# Patient Record
Sex: Female | Born: 1990 | Race: White | Hispanic: No | Marital: Married | Smoking: Never smoker
Health system: Southern US, Community
[De-identification: ages and names within clinical notes are randomized; demographics above are authoritative.]

## PROBLEM LIST (undated history)

## (undated) ENCOUNTER — Inpatient Hospital Stay (HOSPITAL_COMMUNITY): Payer: Self-pay

## (undated) DIAGNOSIS — I10 Essential (primary) hypertension: Secondary | ICD-10-CM

## (undated) DIAGNOSIS — E039 Hypothyroidism, unspecified: Secondary | ICD-10-CM

## (undated) DIAGNOSIS — F32A Depression, unspecified: Secondary | ICD-10-CM

## (undated) DIAGNOSIS — Z6841 Body Mass Index (BMI) 40.0 and over, adult: Secondary | ICD-10-CM

## (undated) DIAGNOSIS — E0590 Thyrotoxicosis, unspecified without thyrotoxic crisis or storm: Secondary | ICD-10-CM

## (undated) HISTORY — DX: Hypothyroidism, unspecified: E03.9

## (undated) HISTORY — DX: Depression, unspecified: F32.A

## (undated) HISTORY — DX: Morbid (severe) obesity due to excess calories: E66.01

## (undated) HISTORY — DX: Essential (primary) hypertension: I10

## (undated) HISTORY — PX: NO PAST SURGERIES: SHX2092

## (undated) HISTORY — DX: Thyrotoxicosis, unspecified without thyrotoxic crisis or storm: E05.90

## (undated) HISTORY — DX: Body Mass Index (BMI) 40.0 and over, adult: Z684

---
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
## 2008-01-01 ENCOUNTER — Ambulatory Visit: Payer: Self-pay | Admitting: Obstetrics and Gynecology

## 2008-01-10 ENCOUNTER — Ambulatory Visit (HOSPITAL_COMMUNITY): Admission: RE | Admit: 2008-01-10 | Discharge: 2008-01-10 | Payer: Self-pay | Admitting: Obstetrics & Gynecology

## 2008-01-16 ENCOUNTER — Ambulatory Visit: Payer: Self-pay | Admitting: Obstetrics and Gynecology

## 2008-01-20 ENCOUNTER — Ambulatory Visit (HOSPITAL_COMMUNITY): Admission: RE | Admit: 2008-01-20 | Discharge: 2008-01-20 | Payer: Self-pay | Admitting: Family Medicine

## 2008-01-20 ENCOUNTER — Ambulatory Visit: Payer: Self-pay | Admitting: Obstetrics and Gynecology

## 2008-01-23 ENCOUNTER — Ambulatory Visit: Payer: Self-pay | Admitting: Obstetrics & Gynecology

## 2008-01-23 ENCOUNTER — Ambulatory Visit (HOSPITAL_COMMUNITY): Admission: RE | Admit: 2008-01-23 | Discharge: 2008-01-23 | Payer: Self-pay | Admitting: Obstetrics & Gynecology

## 2008-01-23 ENCOUNTER — Encounter: Payer: Self-pay | Admitting: Obstetrics & Gynecology

## 2008-01-27 ENCOUNTER — Ambulatory Visit (HOSPITAL_COMMUNITY): Admission: RE | Admit: 2008-01-27 | Discharge: 2008-01-27 | Payer: Self-pay | Admitting: Obstetrics & Gynecology

## 2008-01-29 ENCOUNTER — Ambulatory Visit: Payer: Self-pay | Admitting: Family Medicine

## 2008-01-31 ENCOUNTER — Inpatient Hospital Stay (HOSPITAL_COMMUNITY): Admission: AD | Admit: 2008-01-31 | Discharge: 2008-01-31 | Payer: Self-pay | Admitting: Obstetrics and Gynecology

## 2008-01-31 ENCOUNTER — Ambulatory Visit: Payer: Self-pay | Admitting: Family

## 2008-02-03 ENCOUNTER — Ambulatory Visit (HOSPITAL_COMMUNITY): Admission: RE | Admit: 2008-02-03 | Discharge: 2008-02-03 | Payer: Self-pay | Admitting: Obstetrics & Gynecology

## 2008-02-05 ENCOUNTER — Ambulatory Visit: Payer: Self-pay | Admitting: Obstetrics and Gynecology

## 2008-02-06 ENCOUNTER — Ambulatory Visit (HOSPITAL_COMMUNITY): Admission: RE | Admit: 2008-02-06 | Discharge: 2008-02-06 | Payer: Self-pay | Admitting: Obstetrics & Gynecology

## 2008-02-10 ENCOUNTER — Ambulatory Visit (HOSPITAL_COMMUNITY): Admission: RE | Admit: 2008-02-10 | Discharge: 2008-02-10 | Payer: Self-pay | Admitting: Obstetrics & Gynecology

## 2008-02-12 ENCOUNTER — Ambulatory Visit: Payer: Self-pay | Admitting: Obstetrics and Gynecology

## 2008-02-12 ENCOUNTER — Encounter: Payer: Self-pay | Admitting: Obstetrics & Gynecology

## 2008-02-12 LAB — CONVERTED CEMR LAB
ALT: 8 units/L (ref 0–35)
AST: 13 units/L (ref 0–37)
Albumin: 3.1 g/dL — ABNORMAL LOW (ref 3.5–5.2)
Alkaline Phosphatase: 199 units/L — ABNORMAL HIGH (ref 39–117)
Glucose, Bld: 75 mg/dL (ref 70–99)
MCHC: 32.5 g/dL (ref 30.0–36.0)
Potassium: 4.3 meq/L (ref 3.5–5.3)
RBC: 4.33 M/uL (ref 3.87–5.11)
Sodium: 136 meq/L (ref 135–145)
Total Protein: 6.5 g/dL (ref 6.0–8.3)

## 2008-02-13 ENCOUNTER — Inpatient Hospital Stay (HOSPITAL_COMMUNITY): Admission: AD | Admit: 2008-02-13 | Discharge: 2008-02-14 | Payer: Self-pay | Admitting: Family Medicine

## 2008-02-13 ENCOUNTER — Encounter: Payer: Self-pay | Admitting: Obstetrics & Gynecology

## 2008-02-13 ENCOUNTER — Ambulatory Visit: Payer: Self-pay | Admitting: Physician Assistant

## 2008-02-17 ENCOUNTER — Ambulatory Visit (HOSPITAL_COMMUNITY): Admission: RE | Admit: 2008-02-17 | Discharge: 2008-02-17 | Payer: Self-pay | Admitting: Obstetrics & Gynecology

## 2008-02-19 ENCOUNTER — Encounter: Payer: Self-pay | Admitting: Obstetrics & Gynecology

## 2008-02-19 ENCOUNTER — Ambulatory Visit: Payer: Self-pay | Admitting: Obstetrics and Gynecology

## 2008-02-19 LAB — CONVERTED CEMR LAB
AST: 13 units/L (ref 0–37)
Alkaline Phosphatase: 225 units/L — ABNORMAL HIGH (ref 39–117)
BUN: 5 mg/dL — ABNORMAL LOW (ref 6–23)
Collection Interval-CRCL: 24 hr
Creatinine 24 HR UR: 1390 mg/24hr (ref 700–1800)
Creatinine, Ser: 0.49 mg/dL (ref 0.40–1.20)
Creatinine, Urine: 57.3 mg/dL
HCT: 38.9 % (ref 36.0–46.0)
Hemoglobin: 12.7 g/dL (ref 12.0–15.0)
MCHC: 32.6 g/dL (ref 30.0–36.0)
MCV: 83.1 fL (ref 78.0–100.0)
Protein, Ur: 194 mg/24hr — ABNORMAL HIGH (ref 50–100)
RDW: 16.6 % — ABNORMAL HIGH (ref 11.5–15.5)

## 2008-02-20 ENCOUNTER — Ambulatory Visit: Payer: Self-pay | Admitting: Family Medicine

## 2008-02-20 ENCOUNTER — Ambulatory Visit (HOSPITAL_COMMUNITY): Admission: RE | Admit: 2008-02-20 | Discharge: 2008-02-20 | Payer: Self-pay | Admitting: Obstetrics & Gynecology

## 2008-02-22 ENCOUNTER — Encounter: Payer: Self-pay | Admitting: Obstetrics & Gynecology

## 2008-02-22 ENCOUNTER — Inpatient Hospital Stay (HOSPITAL_COMMUNITY): Admission: AD | Admit: 2008-02-22 | Discharge: 2008-02-24 | Payer: Self-pay | Admitting: Obstetrics & Gynecology

## 2008-02-22 ENCOUNTER — Ambulatory Visit: Payer: Self-pay | Admitting: Advanced Practice Midwife

## 2008-04-08 ENCOUNTER — Encounter: Payer: Self-pay | Admitting: Obstetrics & Gynecology

## 2008-04-08 ENCOUNTER — Ambulatory Visit: Payer: Self-pay | Admitting: Family Medicine

## 2008-04-08 LAB — CONVERTED CEMR LAB: TSH: 0.043 microintl units/mL — ABNORMAL LOW (ref 0.350–4.50)

## 2008-04-09 ENCOUNTER — Encounter: Payer: Self-pay | Admitting: Obstetrics & Gynecology

## 2008-06-30 ENCOUNTER — Ambulatory Visit: Payer: Self-pay | Admitting: Nurse Practitioner

## 2008-08-24 ENCOUNTER — Ambulatory Visit: Payer: Self-pay | Admitting: Internal Medicine

## 2008-09-29 ENCOUNTER — Ambulatory Visit: Payer: Self-pay | Admitting: Obstetrics & Gynecology

## 2008-12-21 ENCOUNTER — Ambulatory Visit: Payer: Self-pay | Admitting: Family Medicine

## 2009-03-22 ENCOUNTER — Ambulatory Visit: Payer: Self-pay | Admitting: Obstetrics and Gynecology

## 2009-06-17 ENCOUNTER — Ambulatory Visit: Payer: Self-pay | Admitting: Obstetrics & Gynecology

## 2009-06-22 IMAGING — US US FETAL BPP W/O NONSTRESS
1 series · 9 of 9 positions shown · non-contrast
Comparison: none

OBSTETRICAL ULTRASOUND:
 This ultrasound was performed in The [HOSPITAL], and the [facility] report will be stored to [REDACTED] PACS.

[Series 1: us fetal bpp w/o nonstress · 9 acquisitions, 9 frames shown]
[im 1/9]
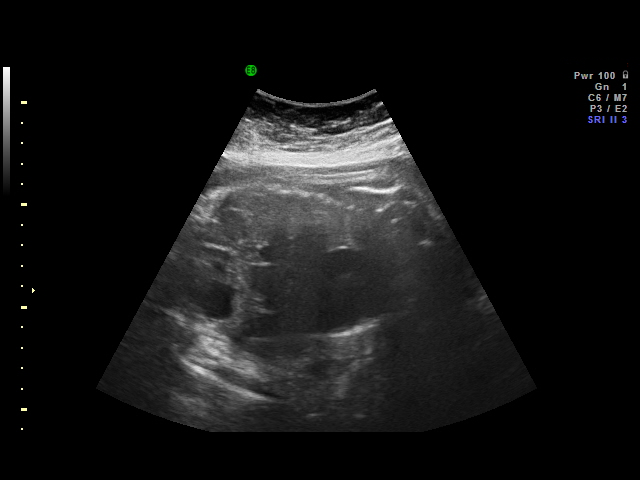
[im 2/9]
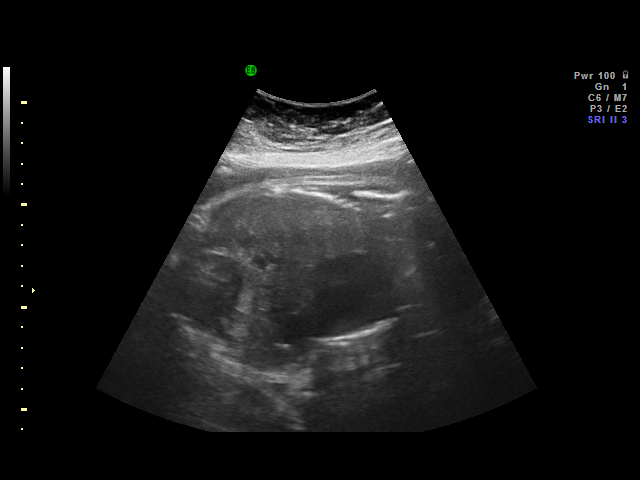
[im 3/9]
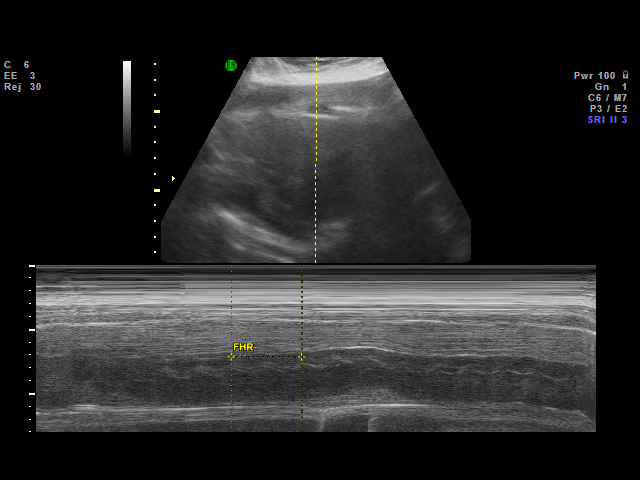
[im 4/9]
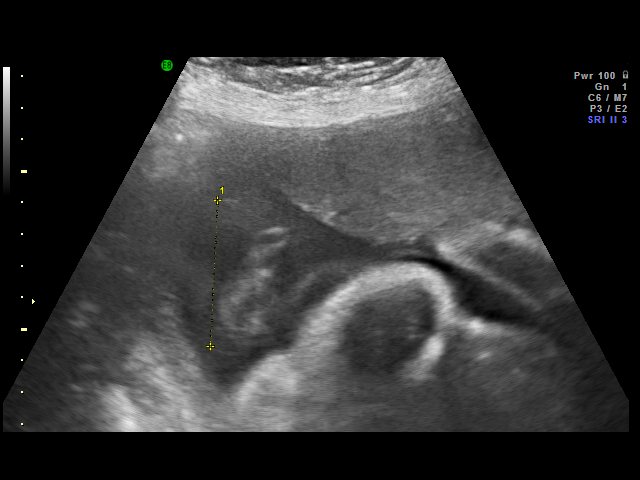
[im 5/9]
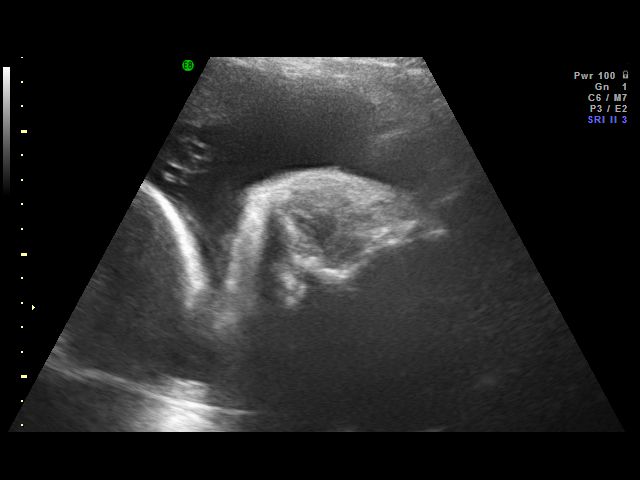
[im 6/9]
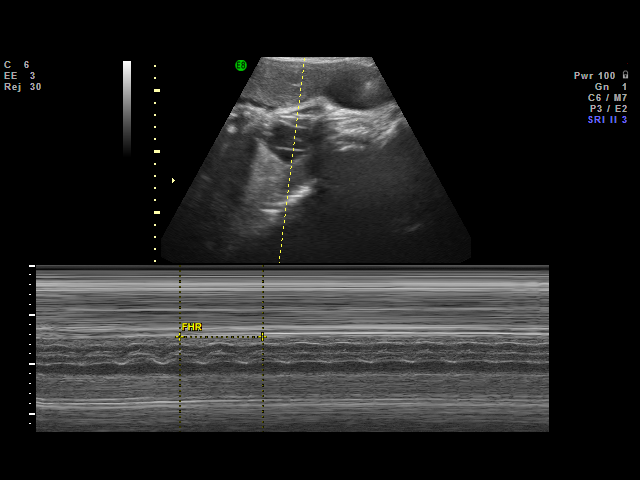
[im 7/9]
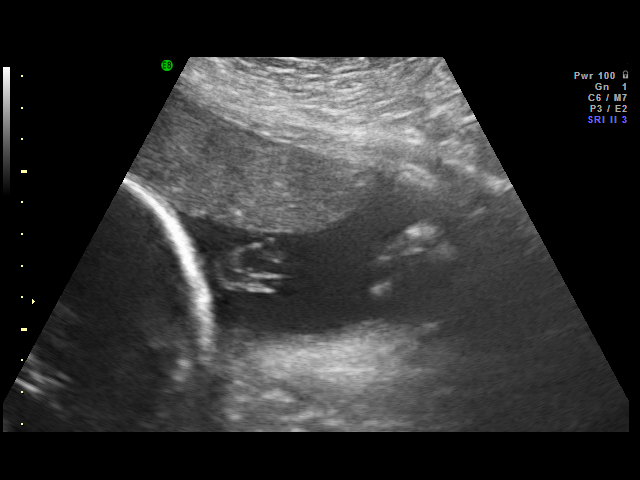
[im 8/9]
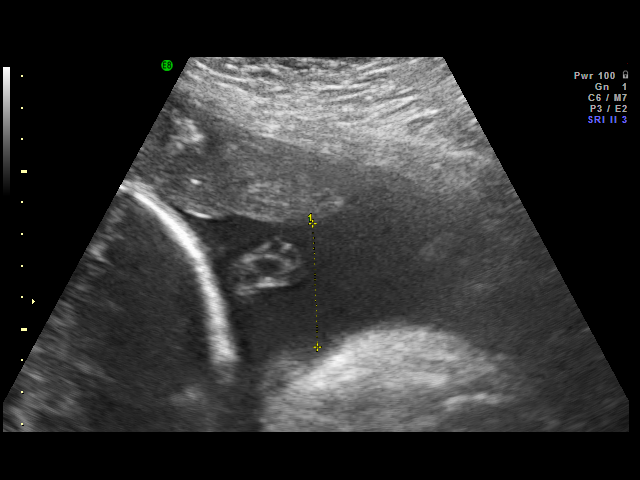
[im 9/9]
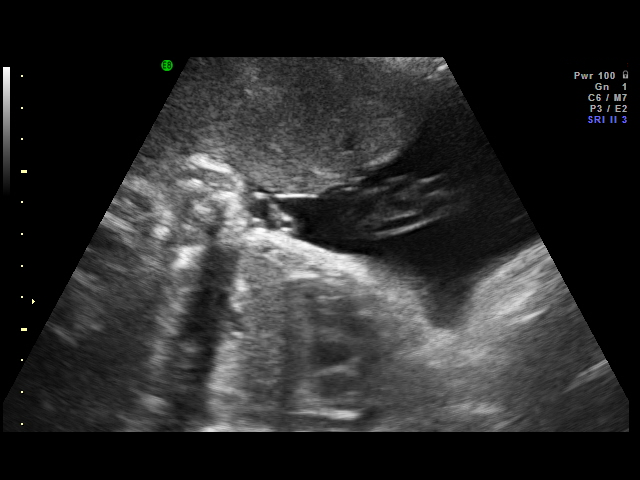

[9 of 9 positions shown; findings below may reference images not displayed]

IMPRESSION: [facility] has also been faxed to the ordering physician.

## 2009-06-25 IMAGING — US US MFM FETAL BPP W/ NONSTRESS ADDL GEST
1 series · 18 of 18 positions shown · non-contrast
Comparison: none

OBSTETRICAL ULTRASOUND:
 This ultrasound was performed in The [HOSPITAL], and the [facility] report will be stored to [REDACTED] PACS.

[Series 1: us mfm fetal bpp w/ nonstress addl gest · 18 of 18 slices shown]
[im 1/18]
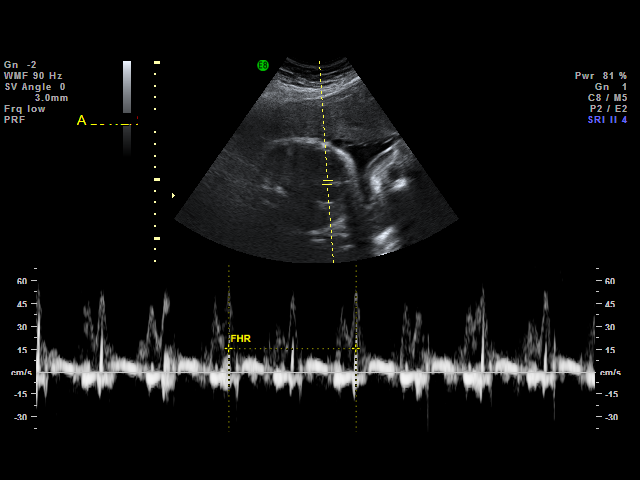
[im 2/18]
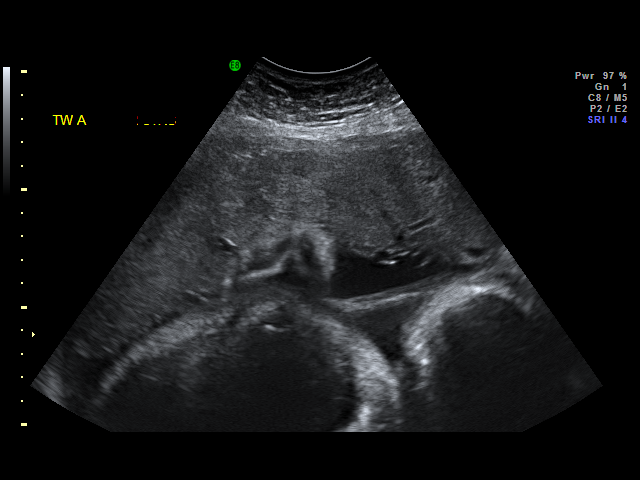
[im 3/18]
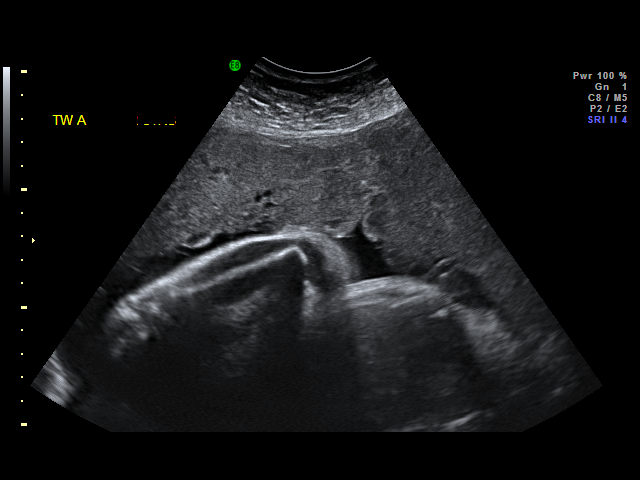
[im 4/18]
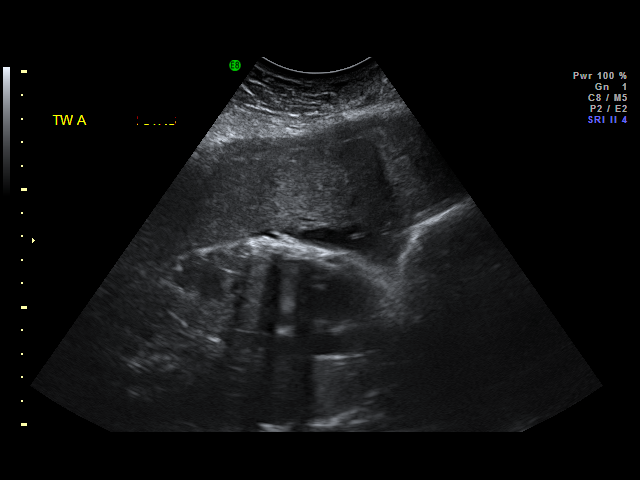
[im 5/18]
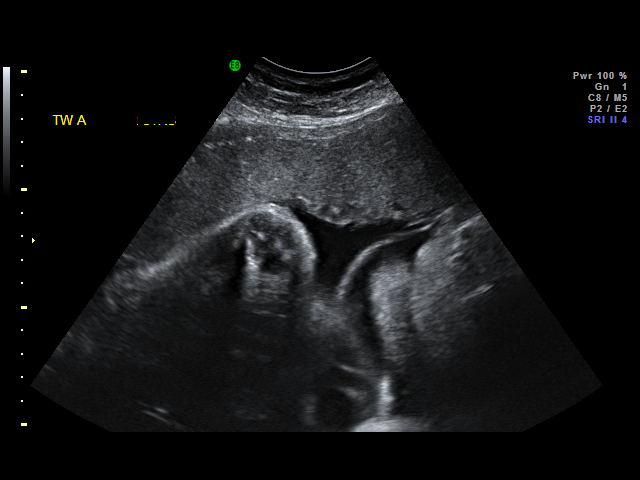
[im 6/18]
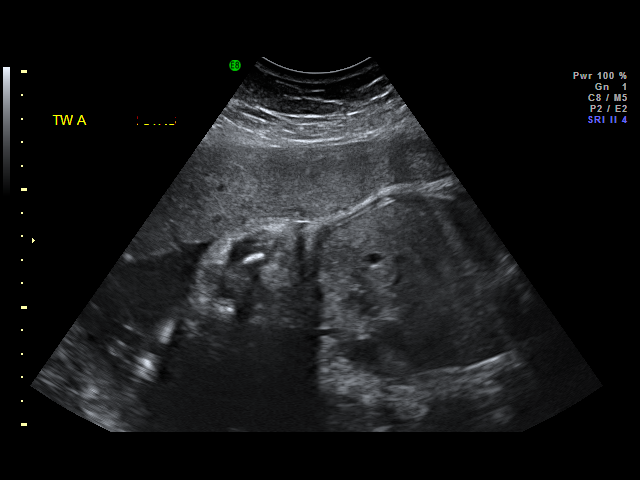
[im 7/18]
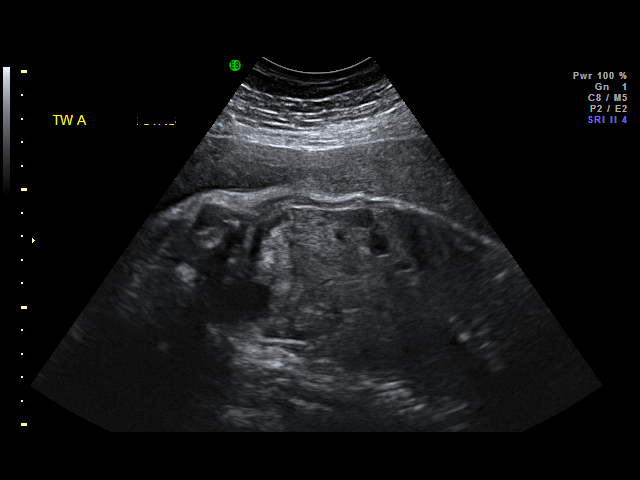
[im 8/18]
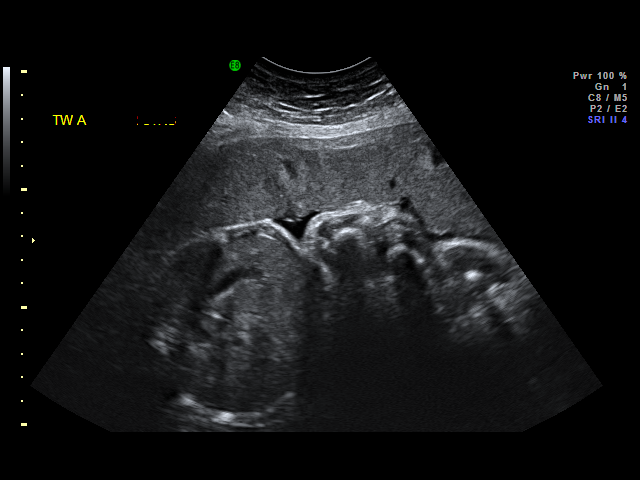
[im 9/18]
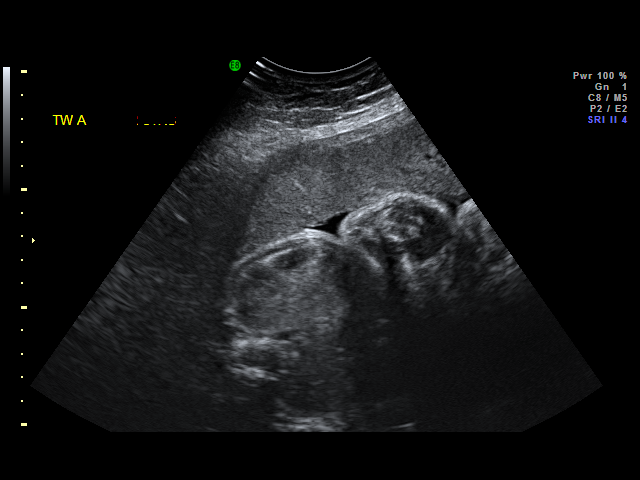
[im 10/18]
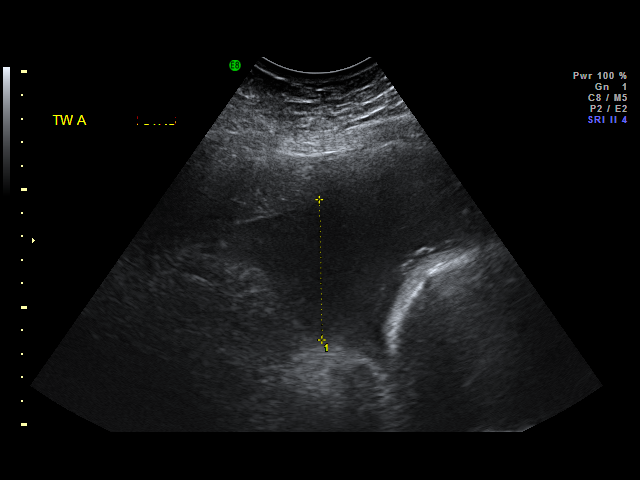
[im 11/18]
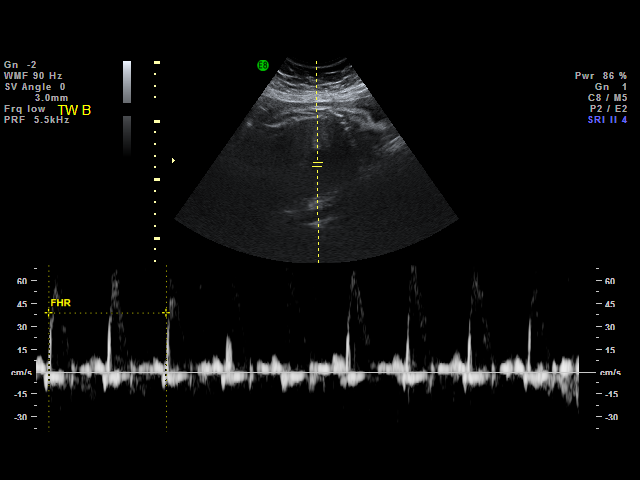
[im 12/18]
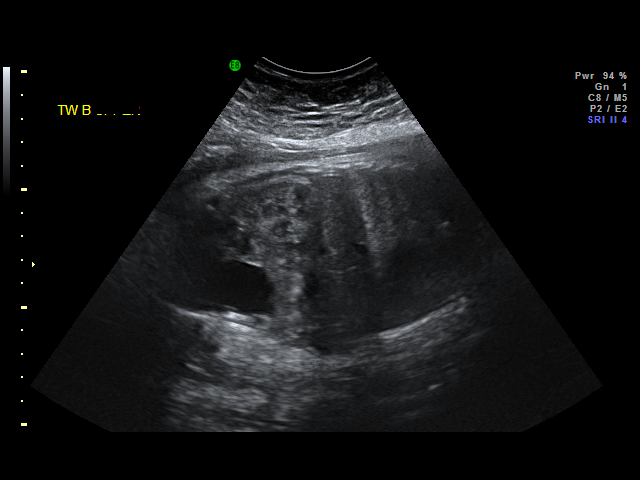
[im 13/18]
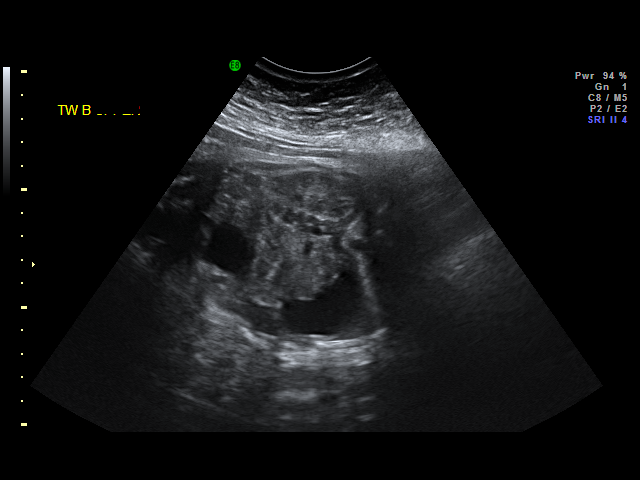
[im 14/18]
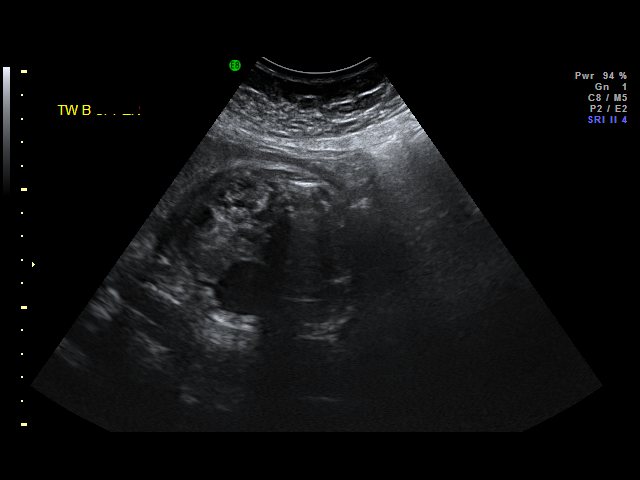
[im 15/18]
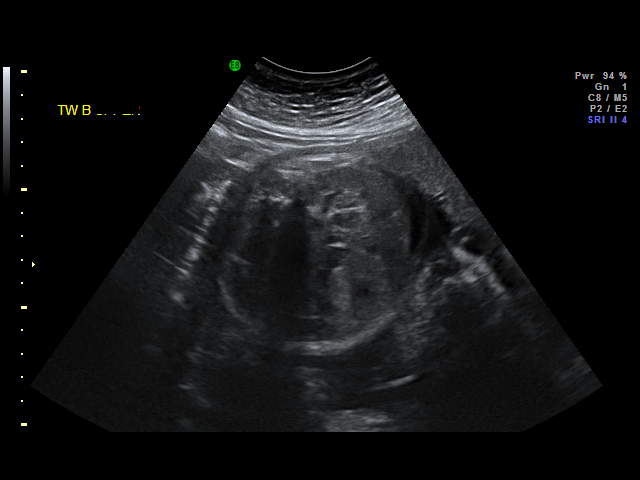
[im 16/18]
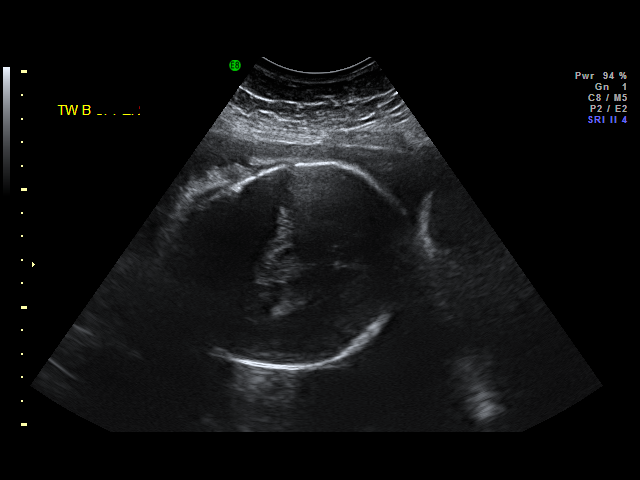
[im 17/18]
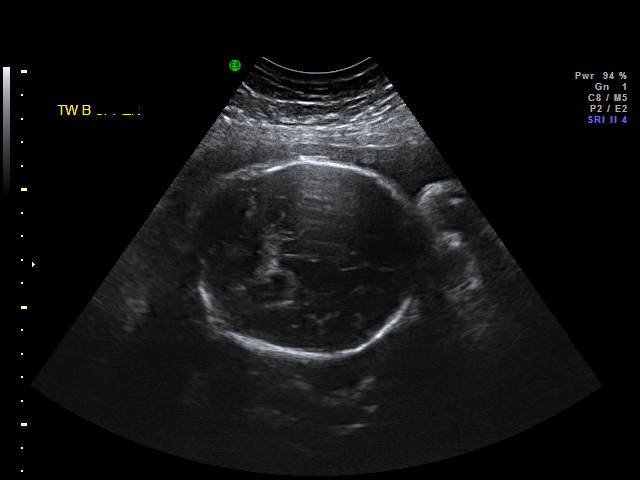
[im 18/18]
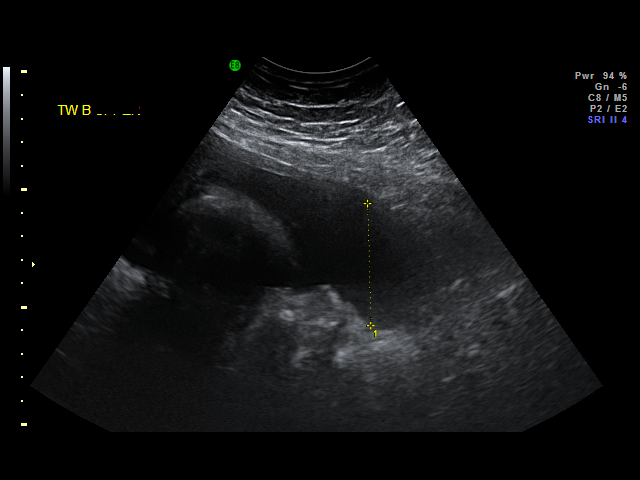

[18 of 18 positions shown; findings below may reference images not displayed]

IMPRESSION: [facility] has also been faxed to the ordering physician.

## 2009-07-06 IMAGING — US US FETAL BPP W/O NONSTRESS
1 series · 9 of 9 positions shown · non-contrast
Comparison: none

OBSTETRICAL ULTRASOUND:
 This ultrasound was performed in The [HOSPITAL], and the [facility] report will be stored to [REDACTED] PACS.

[Series 1: us fetal bpp w/o nonstress · 9 acquisitions, 9 frames shown]
[im 1/9]
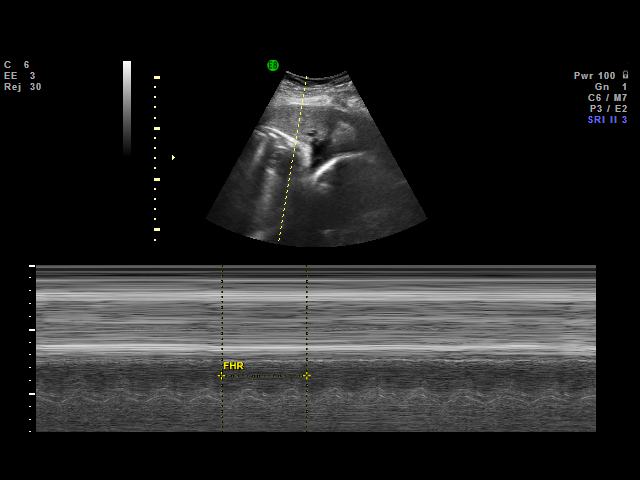
[im 2/9]
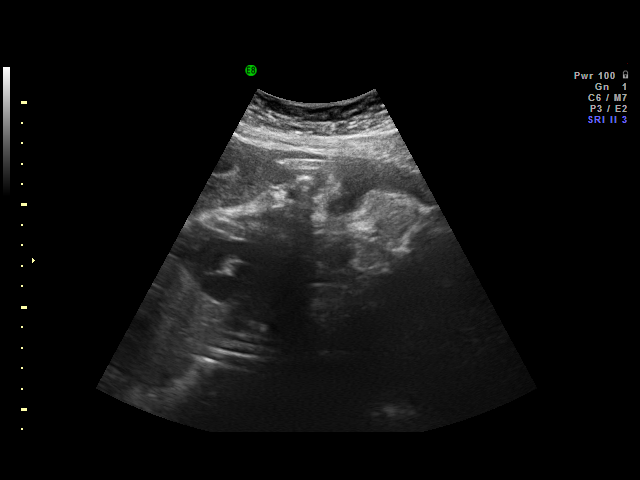
[im 3/9]
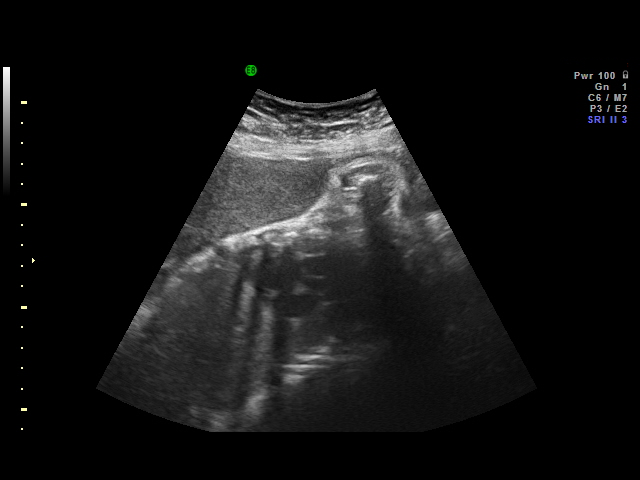
[im 4/9]
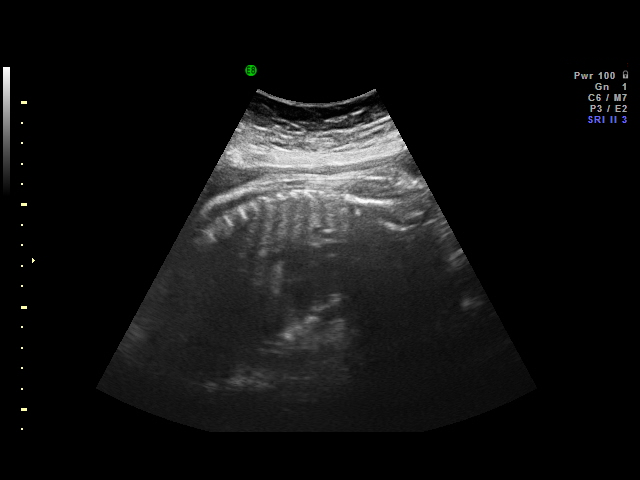
[im 5/9]
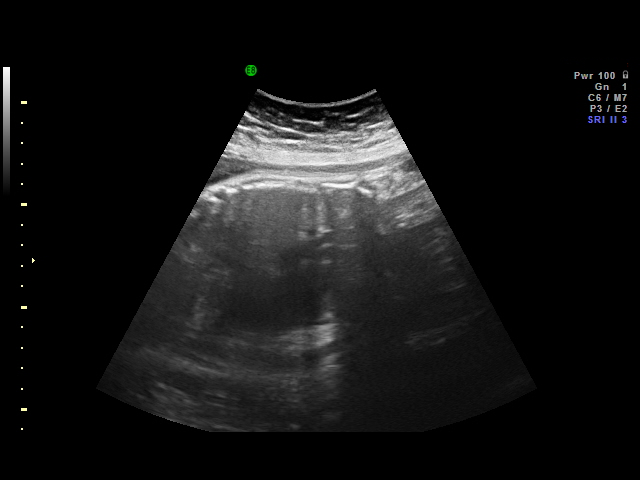
[im 6/9]
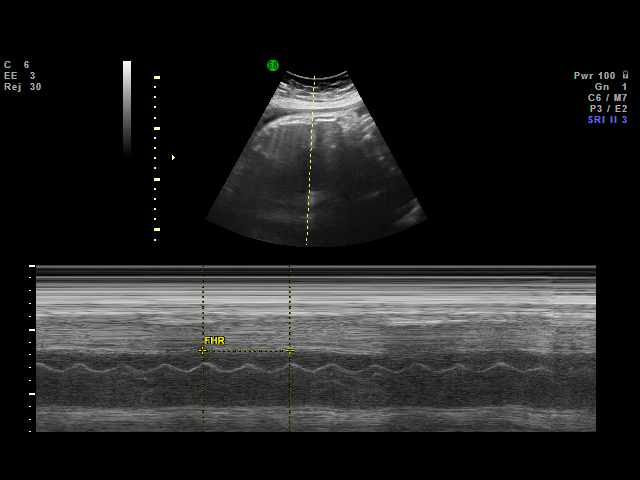
[im 7/9]
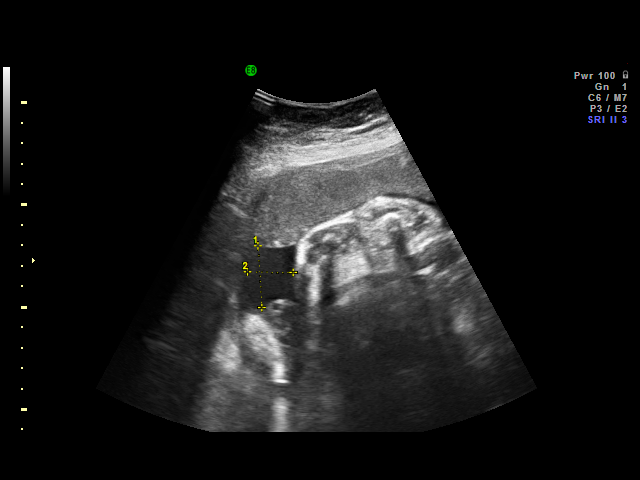
[im 8/9]
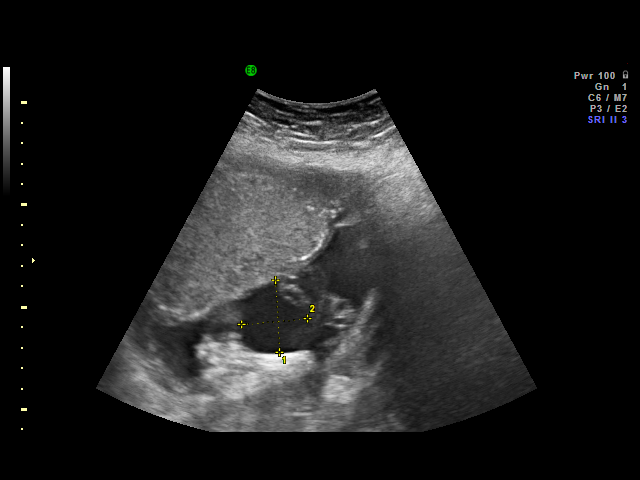
[im 9/9]
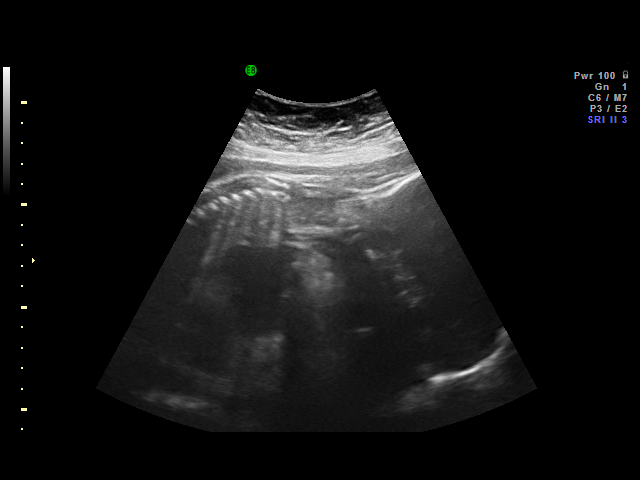

[9 of 9 positions shown; findings below may reference images not displayed]

IMPRESSION: [facility] has also been faxed to the ordering physician.

## 2009-09-09 ENCOUNTER — Ambulatory Visit: Payer: Self-pay | Admitting: Family Medicine

## 2009-12-07 ENCOUNTER — Other Ambulatory Visit: Admission: RE | Admit: 2009-12-07 | Discharge: 2009-12-07 | Payer: Self-pay | Admitting: Family Medicine

## 2009-12-07 ENCOUNTER — Ambulatory Visit: Payer: Self-pay | Admitting: Family Medicine

## 2009-12-15 ENCOUNTER — Ambulatory Visit: Payer: Self-pay | Admitting: Family Medicine

## 2009-12-15 LAB — CONVERTED CEMR LAB: Prolactin: 20 ng/mL

## 2009-12-16 ENCOUNTER — Encounter: Payer: Self-pay | Admitting: Family Medicine

## 2009-12-16 LAB — CONVERTED CEMR LAB: T3, Free: 1.2 pg/mL — ABNORMAL LOW (ref 2.3–4.2)

## 2010-04-14 ENCOUNTER — Ambulatory Visit (INDEPENDENT_AMBULATORY_CARE_PROVIDER_SITE_OTHER): Payer: Self-pay | Admitting: Obstetrics & Gynecology

## 2010-04-14 ENCOUNTER — Ambulatory Visit: Payer: Self-pay

## 2010-04-14 DIAGNOSIS — Z30431 Encounter for routine checking of intrauterine contraceptive device: Secondary | ICD-10-CM

## 2010-05-27 NOTE — Assessment & Plan Note (Signed)
NAMEMarland Kitchen  DENEISHA, DADE NO.:  [id number]  MEDICAL RECORD NO.:  [id number]           PATIENT TYPE:  LOCATION:  [facility]           FACILITY:  PHYSICIAN:  Catalina Antigua, MD     DATE OF BIRTH:  [birth year]  DATE OF SERVICE:                                 CLINIC NOTE  HISTORY OF PRESENT ILLNESS:  This is a 20 year old G1, P1-0-0-2 who presents today for IUD check an complains of dysuria.  The patient had the IUD inserted on December 16, 2009, and has been content with her form of birth control.  The patient states that over the past week or so, she has been experiencing burning upon urination and frequent urination and was concerned that it may be related to the IUD.  The patient is otherwise doing well.  Denies any abnormal bleeding or discharge.  On physical exam, her blood pressure is 143/102, weight of 284 pounds, height of 5 feet 6 inches.  Her abdomen is soft, nontender, and nondistended.  Some mild suprapubic tenderness was elicited.  On pelvic exam, she had normal vaginal mucosa and cervix.  IUD strings were visualized.  No abnormal bleeding or discharge.  ASSESSMENT/PLAN:  She is a 20 year old G1, P1 0-0-2 who is here for IUD check and evaluation of dysuria.  The patient did not take her blood pressure medication today and was advised to take her blood pressure medication as directed and to follow up with her primary care physician. The patient was also informed that she had a urinary tract infection as the UA demonstrated positive nitrites and leukocytes.  Prescription for Keflex was provided.  The patient is otherwise scheduled to return in October for annual exam.          ______________________________ Catalina Antigua, MD    PC/MEDQ  D:  04/14/2010  T:  04/15/2010  Job:  [id number]

## 2010-07-19 NOTE — Assessment & Plan Note (Signed)
NAME:  Tara Woodward, MAGO NO.:  [id number]   MEDICAL RECORD NO.:  [id number]          PATIENT TYPE:  POB   LOCATION:  [facility] at [facility]         FACILITY:  [facility]   PHYSICIAN:  Tinnie Gens, MD        DATE OF BIRTH:  1990-05-24   DATE OF SERVICE:  12/07/2009                                  CLINIC NOTE   CHIEF COMPLAINT:  Physical exam.   HISTORY OF PRESENT ILLNESS:  The patient is a 20 year old gravida 1,  para 1-0-0-2 who had a twin vaginal delivery approximately 2 years ago.  Since that time she has not been back here as she has been getting Depo-  Provera.  She does have cycles on her Depo, last was September 2010.  Her weight is 281 pounds, up approximately 30 pounds since beginning  pregnancy 2 years ago.  She also has a history of hypertension and  hypothyroidism; however, she has not been able to afford labetalol for  blood pressure medicine.  She is taking Armour Thyroid at times,  although she cannot necessarily afford that.  She states that she last  had her thyroid checked approximately 3 months ago.   PAST MEDICAL HISTORY:  1. Hypertension.  2. Hypothyroidism.  3. Morbid obesity.   PAST SURGICAL HISTORY:  Negative.   MEDICATIONS:  She is on Armour Thyroid 60 mg p.o. daily if she can  afford to take it.  She is supposed to be on labetalol 300 mg p.o.  b.i.d. which she cannot afford presently.   ALLERGIES:  None known.   FAMILY HISTORY:  Heart disease, hypertension, COPD.  Hypertension,  coronary artery disease, cerebrovascular accident, diabetes and lung  cancer in a family member who is a smoker.   SOCIAL HISTORY:  The patient lives with her husband who works for [employer]  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   REVIEW OF SYSTEMS:  A 14-point review of system reviewed.  She reports  her mood as fairly stable.  She has sad times but not terrible.  She  denies fevers, chills, nausea, vomiting, diarrhea, constipation,  shortness of breath, chest pain, swelling of feet or ankles, breast  masses, vaginal discharge.   PHYSICAL EXAMINATION:  VITAL SIGNS:  As noted in the chart.  Blood  pressure is 144/114, recheck 146/117.  GENERAL:  She is an obese female in no acute distress.  HEENT:  Normocephalic, atraumatic.  Sclerae anicteric.  NECK:  Supple.  Normal thyroid.  LUNGS:  Clear bilaterally.  CV:  Regular rate and rhythm.  No rubs, gallops or murmurs.  ABDOMEN:  Soft and obese.  No masses.  EXTREMITIES:  No cyanosis, clubbing or edema.  2+ distal pulses.  BREASTS:  Symmetric with everted nipples.  No masses.  No  supraclavicular or axillary adenopathy.  GU:  Normal external female genitalia.  BUS is normal.  Vagina is pink  and rugated.  Cervix is anterior.  I suspect that her uterus is  retroverted, although getting an excellent feel is difficult given body  habitus.  Adnexal exam is similarly limited.   IMPRESSION:  1. Morbid obesity.  2.  Hypertension, poorly controlled.  3. Hypothyroidism, unclear control.  4. Undesired fertility.  Discussed with the patient at length need for      blood pressure control.  5. Pap smear today.  6. Begin HCTZ, will get a 10-dollar list for 74-month supply.  Would      recommend rechecking her thyroid function in approximately 3 months      to see how this does.  I have also recommended an IUD.  The patient      is given information on this.  She will call back to schedule.  She      got Depo-Provera today so she may schedule any time over the next 3      months.           ______________________________  Tinnie Gens, MD     TP/MEDQ  D:  12/07/2009  T:  12/08/2009  Job:  [id number]

## 2010-07-19 NOTE — Assessment & Plan Note (Signed)
NAMEJORDAIN, RADIN              ACCOUNT NO.:  [id number]   MEDICAL RECORD NO.:  [id number]          PATIENT TYPE:  POB   LOCATION:  [facility]         FACILITY:  [facility]   PHYSICIAN:  Tinnie Gens, MD        DATE OF BIRTH:  01/02/1991   DATE OF SERVICE:                                  CLINIC NOTE   CHIEF COMPLAINT:  Postpartum check.   HISTORY OF PRESENT ILLNESS:  The patient is a 20 year old gravida 1,  para 1-0-0-2, who had vaginal delivery of twins on February 22, 2008.  Since that time, she had done well.  The babies are not really sleeping  and they are being bottle fed.  She is currently on her cycle which  seems heavy for her.  This is first period back after pregnancy.  She  has not yet had sex.  She reports that her mood is okay.  She was not  having significant postpartum depression issues.  She is continuing on  her labetalol 300 twice a day.  She is on medication prior to achieving  pregnancy.  Her blood pressure control was okay with continuing this.  She is also on Armour Thyroid and has not had a level checked since  November 2009.  She likes the Depo-Provera for contraception.  The  patient is less than 21 and has not needed Pap smears as of yet.   PHYSICAL EXAMINATION:  VITAL SIGNS:  Today, her vitals are as in the  chart.  GENERAL:  She is a well-developed and well-nourished female.  She is a  morbidly obese female in no acute distress.  ABDOMEN:  Soft, nontender, and nondistended.  GU:  Heavy vaginal bleeding noted.  Normal external female genitalia.  Cervix is approximately finger tip dilated, but is small and anteverted  exam is somewhat limited by body habitus.   IMPRESSION:  Postpartum check.  No evidence of postpartum depression.   PLAN:  1. Depo-Provera.  2. Check TSH today.  3. Continue labetalol.  4. Return with difficulties in mood.  5. First Pap smear at age 42.           ______________________________  Tinnie Gens, MD     TP/MEDQ   D:  04/08/2008  T:  04/08/2008  Job:  [id number]

## 2010-07-19 NOTE — Assessment & Plan Note (Signed)
NAMEMarland Kitchen  Tara, Woodward NO.:  [id number]   MEDICAL RECORD NO.:  [id number]          PATIENT TYPE:  POB   LOCATION:  [facility] at [facility]         FACILITY:  [facility]   PHYSICIAN:  Tinnie Gens, MD        DATE OF BIRTH:  Jul 25, 1990   DATE OF SERVICE:  12/15/2009                                  CLINIC NOTE   CHIEF COMPLAINT:  IUD insertion.   HISTORY OF PRESENT ILLNESS:  The patient is a 20 year old gravida 1,  para 1-0-0-2 who came in last week for physical exam.  She had been on  Depo-Provera and desires some different for birth control.  After  consideration of options, she opted for Mirena IUD insertion.   The patient also today is complaining of producing breast milk in both  breasts.  She did not breast feed her babies and they are also 54-month-  old and so she is unclear why she is suddenly having this.  She has been  having fairly regular spotting, but she has been on Depo-Provera since  long and whether she has been having normal cycles.  She does have  abnormal thyroid; however, she has not been completely taking it.   PHYSICAL EXAMINATION:  VITAL SIGNS:  As in the chart.  Her blood  pressure is 136/110.  GENERAL:  She is an obese female in no acute distress.  GU:  Normal external female genitalia.  BUS is normal.  Vagina is pink  and rugated.  Cervix is parous and without lesion.   PROCEDURE:  Cervix is grasped at the anterior lip with single tooth  tenaculum, cleaned with Betadine x2, sounded to 7 cm.  A Mirena IUD is  placed without difficulty.  Strings were trimmed to 3 cm.  The patient  are the procedure well.   IMPRESSION:  1. Undesired fertility status post intrauterine device insertion.  2. Galactorrhea.   PLAN:  1. Continue IUD for next 5 years.  Discussed spotting and come back in      4-6 weeks for IUD check.  2. Check prolactin and TSH.  I did discuss possible prolactinoma with      the patient and if her prolactin level is elevated,  imaging of her      brain will be required.           ______________________________  Tinnie Gens, MD     TP/MEDQ  D:  12/15/2009  T:  12/16/2009  Job:  [id number]

## 2010-07-19 NOTE — Discharge Summary (Signed)
NAMERAYAN, Tara Woodward NO.:  [id number]   MEDICAL RECORD NO.:  [id number]          PATIENT TYPE:  INP   LOCATION:  9157                          FACILITY:  [facility]   PHYSICIAN:  Tilda Burrow, M.D. DATE OF BIRTH:  11-12-1990   DATE OF ADMISSION:  02/13/2008  DATE OF DISCHARGE:  02/14/2008                               DISCHARGE SUMMARY   The attending physician at the time of her discharge is Dr. Christin Bach.   In brief, the patient was admitted as a twin gestation at 35 weeks and 5  days with chronic hypertension with elevated blood pressures.  She was  sent from the [facility] for a 23-hour observation to rule out  superimposed preeclampsia.  At the time of her admission, fetal heart  rates were reassuring and fetal testing and well being was reassured on  both twin A and twin B.  PIH labs were drawn and found to be within  normal limits.  A 24-hour urine was collected during her admission and  was found to have a total urine protein of 202.  She had a growth  ultrasound estimating the fetal weight of baby A at 5:05, which was 71st  percentile, baby B at 5:15 which was 67th percentile giving a 6%  discordance between the twins.  AFI was subjectively normal.  During the  course of her hospital stay, the patient's blood pressures have been  well controlled on her course of 300 mg of labetalol twice a day and  remained stable throughout her hospital course.  She is being discharged  on February 14, 2008, to home with preeclampsia precautions.  We will  continue her 300 mg labetalol b.i.d.  She is to follow up Monday for an  NST at [facility] and Wednesday for her regular visits at [facility].  She is also planned for an induction of labor as scheduled at 37  weeks secondary to twin gestation.  The patient is instructed to fetal  kick counts daily and return if any signs and symptoms of preeclampsia  occur after her discharge.      Maylon Cos, C.N.M.      Tilda Burrow, M.D.  Electronically Signed    SS/MEDQ  D:  02/14/2008  T:  02/15/2008  Job:  [id number]

## 2010-12-09 LAB — CBC
HCT: 32.7 % — ABNORMAL LOW (ref 36.0–46.0)
HCT: 34.9 % — ABNORMAL LOW (ref 36.0–46.0)
HCT: 36.3 % (ref 36.0–46.0)
Hemoglobin: 12.4 g/dL (ref 12.0–15.0)
MCHC: 34.2 g/dL (ref 30.0–36.0)
MCHC: 34.3 g/dL (ref 30.0–36.0)
MCV: 83.2 fL (ref 78.0–100.0)
MCV: 83.6 fL (ref 78.0–100.0)
MCV: 84.3 fL (ref 78.0–100.0)
Platelets: 344 10*3/uL (ref 150–400)
RBC: 4.2 MIL/uL (ref 3.87–5.11)
RBC: 4.35 MIL/uL (ref 3.87–5.11)
RDW: 18.1 % — ABNORMAL HIGH (ref 11.5–15.5)
WBC: 11.7 10*3/uL — ABNORMAL HIGH (ref 4.0–10.5)
WBC: 9.5 10*3/uL (ref 4.0–10.5)

## 2010-12-09 LAB — COMPREHENSIVE METABOLIC PANEL
AST: 17 U/L (ref 0–37)
Albumin: 2.3 g/dL — ABNORMAL LOW (ref 3.5–5.2)
Alkaline Phosphatase: 190 U/L — ABNORMAL HIGH (ref 39–117)
CO2: 21 mEq/L (ref 19–32)
Chloride: 105 mEq/L (ref 96–112)
Creatinine, Ser: 0.39 mg/dL — ABNORMAL LOW (ref 0.4–1.2)
GFR calc Af Amer: >60 mL/min (ref >60)
GFR calc non Af Amer: >60 mL/min (ref >60)
Potassium: 3.7 mEq/L (ref 3.5–5.1)
Total Bilirubin: 0.4 mg/dL (ref 0.3–1.2)

## 2010-12-09 LAB — CREATININE CLEARANCE, URINE, 24 HOUR
Collection Interval-CRCL: 24 hours
Creatinine, 24H Ur: 1466 mg/d (ref 700–1800)
Urine Total Volume-CRCL: 2525 mL

## 2010-12-09 LAB — PROTEIN, URINE, 24 HOUR
Collection Interval-UPROT: 24 hours
Protein, 24H Urine: 202 mg/d — ABNORMAL HIGH (ref 50–100)
Protein, Urine: 8 mg/dL
Urine Total Volume-UPROT: 2525 mL

## 2010-12-09 LAB — STREP B DNA PROBE

## 2013-01-10 ENCOUNTER — Ambulatory Visit (INDEPENDENT_AMBULATORY_CARE_PROVIDER_SITE_OTHER): Payer: Medicaid Other | Admitting: Obstetrics & Gynecology

## 2013-01-10 ENCOUNTER — Encounter: Payer: Self-pay | Admitting: Obstetrics & Gynecology

## 2013-01-10 VITALS — BP 167/123 | HR 85 | Ht 65.5 in | Wt 291.6 lb

## 2013-01-10 DIAGNOSIS — O210 Mild hyperemesis gravidarum: Secondary | ICD-10-CM

## 2013-01-10 DIAGNOSIS — IMO0001 Reserved for inherently not codable concepts without codable children: Secondary | ICD-10-CM

## 2013-01-10 DIAGNOSIS — Z3009 Encounter for other general counseling and advice on contraception: Secondary | ICD-10-CM

## 2013-01-10 DIAGNOSIS — Z30431 Encounter for routine checking of intrauterine contraceptive device: Secondary | ICD-10-CM

## 2013-01-10 LAB — POCT URINE PREGNANCY: Preg Test, Ur: NEGATIVE

## 2013-01-10 MED ORDER — HYDROCHLOROTHIAZIDE 25 MG PO TABS: 25.0000 mg | ORAL_TABLET | Freq: Every day | ORAL | Status: DC

## 2013-01-10 NOTE — Progress Notes (Signed)
Patient would like to discuss getting her tubes tied.  She has twins that are 24 years old and it is time to have her Mirena IUD removed.  She only has medicaid for contraception purposes right now so she has been unable to see her primary care doctor regarding her high blood pressure and thyroid disorder.  We will check into patient assistance programs for her and give her this information before she leaves today.

## 2013-01-10 NOTE — Patient Instructions (Signed)

## 2013-01-10 NOTE — Progress Notes (Signed)
  Subjective:    Patient ID: Tara Woodward, female    DOB: Sep 05, 1990, 22 y.o.   MRN: [id number]  HPI  22 yo P2 (24 yo twin girls) who is here to discuss having surgery for permanent sterility. She currently has medicaid. It is time for her Mirena to be removed. Review of Systems     Objective:   Physical Exam  Heart- rrr Lungs- CTAB Abd- morbid obesity      Assessment & Plan:  HTN- I will start her on HCTZ. RTC 2 weeks for BP check. She will get an appt with the [facility] for medical management Contraception- She will sign her PPS forms today and I will send [place] an email to schedule her surgery. We discussed removing her tubes to help prevent ovarian cancer as well as for permanent sterility. She understands that this is not reversible. She reports that she has already had a flu vaccine this season.

## 2013-01-24 ENCOUNTER — Encounter: Payer: Self-pay | Admitting: Obstetrics and Gynecology

## 2013-01-24 ENCOUNTER — Ambulatory Visit (INDEPENDENT_AMBULATORY_CARE_PROVIDER_SITE_OTHER): Payer: Medicaid Other | Admitting: Obstetrics and Gynecology

## 2013-01-24 VITALS — BP 124/103 | HR 79 | Ht 65.0 in | Wt 284.0 lb

## 2013-01-24 DIAGNOSIS — I10 Essential (primary) hypertension: Secondary | ICD-10-CM

## 2013-01-24 DIAGNOSIS — Z309 Encounter for contraceptive management, unspecified: Secondary | ICD-10-CM

## 2013-01-24 NOTE — Progress Notes (Signed)
Patient ID: Tara Woodward, female   DOB: 1990/05/29, 22 y.o.   MRN: [id number] Patient presents today as a follow up appointment for blood pressure check. Patient was seen 2 weeks ago for BTL counseling. Patient is scheduled for 03/18/2013. Patient was noted to be hypertensive and started on HCTZ. Patient has not been able to follow up with primary care physician due to lack of insurance and inability to afford medication. Patient now desires to postpone BTL and use Mirena IUD for contraception.  Discussed with patient the importance of having her blood pressure under control regardless of mode of contraception in order to decrease risks of cardiovascular disease and stroke. Patient verbalized understanding. Patient will return for IUD insertion. Patient was informed that her BTL will be cancelled when she returns for her IUD placement.

## 2013-01-28 ENCOUNTER — Ambulatory Visit (INDEPENDENT_AMBULATORY_CARE_PROVIDER_SITE_OTHER): Payer: Medicaid Other | Admitting: Obstetrics & Gynecology

## 2013-01-28 ENCOUNTER — Encounter: Payer: Self-pay | Admitting: Obstetrics & Gynecology

## 2013-01-28 VITALS — BP 135/99 | HR 88 | Ht 65.0 in | Wt 289.0 lb

## 2013-01-28 DIAGNOSIS — Z30433 Encounter for removal and reinsertion of intrauterine contraceptive device: Secondary | ICD-10-CM

## 2013-01-28 DIAGNOSIS — Z01812 Encounter for preprocedural laboratory examination: Secondary | ICD-10-CM

## 2013-01-28 MED ORDER — LEVONORGESTREL 20 MCG/24HR IU IUD: 1.0000 | INTRAUTERINE_SYSTEM | Freq: Once | INTRAUTERINE | Status: DC

## 2013-01-28 NOTE — Progress Notes (Signed)
GYNECOLOGY CLINIC PROCEDURE NOTE  Tara Woodward is a 22 y.o. [id number] here for Mirena IUD removal and reinsertion. No GYN concerns.  Last pap smear was in 2010 and was normal as per patient report.  IUD Removal and Reinsertion  Patient identified, informed consent performed. Discussed risks of irregular bleeding, cramping, infection, malpositioning or misplacement of the IUD outside the uterus which may require further procedures.  Also advised to use backup contraception for one week as the risk of pregnancy is higher during the transition period of removing an IUD and replacing it with another one.  Time out was performed. Speculum placed in the vagina. The strings of the Mirena IUD were grasped and pulled using ring forceps. The IUD was successfully removed in its entirety. The cervix was cleaned with Betadine x 2. The new Mirena IUD insertion apparatus was used to sound the uterus to 8 cm;  the IUD was then placed per manufacturer's recommendations. Strings were trimmed to 3 cm.  Good hemostasis noted. Patient tolerated procedure well.   Patient was given post-procedure instructions.  Patient was also asked to check IUD strings periodically and follow up in 4 weeks for IUD check and annual exam.  Jaynie Collins, MD, FACOG Attending Obstetrician & Gynecologist Faculty Practice, [facility]

## 2013-01-28 NOTE — Patient Instructions (Signed)
Intrauterine Device Insertion Care After Refer to this sheet in the next few weeks. These instructions provide you with information on caring for yourself after your procedure. Your caregiver may also give you more specific instructions. Your treatment has been planned according to current medical practices, but problems sometimes occur. Call your caregiver if you have any problems or questions after your procedure. HOME CARE INSTRUCTIONS   Only take over-the-counter or prescription medicines for pain, discomfort, or fever as directed by your caregiver. Do not use aspirin. This may increase bleeding.  Check your IUD to make sure it is in place before you resume sexual activity. You should be able to feel the strings. If you cannot feel the strings, something may be wrong. The IUD may have fallen out of the uterus, or the uterus may have been punctured (perforated) during placement. Also, if the strings are getting longer, it may mean that the IUD is being forced out of the uterus. You no longer have full protection from pregnancy if any of these problems occur.  You may resume sexual intercourse if you are not having problems with the IUD. The IUD is considered immediately effective.  You may resume normal activities.  Keep all follow-up appointments to be sure your IUD has remained in place. After the first exam, yearly exams are advised, unless you cannot feel the strings of your IUD.  Continue to check that the IUD is still in place by feeling for the strings after every menstrual period. SEEK MEDICAL CARE IF:   You have bleeding that is heavier or lasts longer than a normal menstrual cycle.  You have a fever.  You have increasing cramps or abdominal pain not relieved with medicine.  You have abdominal pain that does not seem to be related to the same area of earlier cramping and pain.  You are lightheaded, unusually weak, or faint.  You have abnormal vaginal discharge or  smells.  You have pain during sexual intercourse.  You cannot feel the IUD strings, or the IUD string has gotten longer.  You feel the IUD at the opening of the cervix in the vagina.  You think you are pregnant, or you miss your menstrual period.  The IUD string is hurting your sex partner. Document Released: 10/19/2010 Document Revised: 05/15/2011 Document Reviewed: 08/11/2012 [employer] Patient Information 2014 [employer].  

## 2013-02-25 ENCOUNTER — Ambulatory Visit: Payer: Medicaid Other | Admitting: Obstetrics and Gynecology

## 2013-03-04 ENCOUNTER — Emergency Department: Payer: Self-pay | Admitting: Emergency Medicine

## 2013-03-18 ENCOUNTER — Ambulatory Visit: Admit: 2013-03-18 | Payer: Self-pay | Admitting: Obstetrics & Gynecology

## 2013-03-18 SURGERY — SALPINGECTOMY, BILATERAL, LAPAROSCOPIC
Anesthesia: General | Laterality: Bilateral

## 2013-05-31 ENCOUNTER — Emergency Department: Payer: Self-pay | Admitting: Emergency Medicine

## 2013-07-24 ENCOUNTER — Emergency Department: Payer: Self-pay | Admitting: Emergency Medicine

## 2013-10-09 ENCOUNTER — Emergency Department: Payer: Self-pay | Admitting: Emergency Medicine

## 2014-01-05 ENCOUNTER — Encounter: Payer: Self-pay | Admitting: Obstetrics & Gynecology

## 2016-11-08 ENCOUNTER — Encounter: Payer: Self-pay | Admitting: *Deleted

## 2016-11-08 ENCOUNTER — Emergency Department
Admission: EM | Admit: 2016-11-08 | Discharge: 2016-11-08 | Disposition: A | Discharge status: Home / Self Care | Payer: Self-pay | Attending: Emergency Medicine | Admitting: Emergency Medicine

## 2016-11-08 DIAGNOSIS — F32A Depression, unspecified: Secondary | ICD-10-CM

## 2016-11-08 DIAGNOSIS — F339 Major depressive disorder, recurrent, unspecified: Secondary | ICD-10-CM

## 2016-11-08 DIAGNOSIS — Z79899 Other long term (current) drug therapy: Secondary | ICD-10-CM | POA: Insufficient documentation

## 2016-11-08 DIAGNOSIS — F329 Major depressive disorder, single episode, unspecified: Secondary | ICD-10-CM | POA: Insufficient documentation

## 2016-11-08 DIAGNOSIS — F322 Major depressive disorder, single episode, severe without psychotic features: Secondary | ICD-10-CM

## 2016-11-08 DIAGNOSIS — I10 Essential (primary) hypertension: Secondary | ICD-10-CM | POA: Diagnosis present

## 2016-11-08 HISTORY — DX: Major depressive disorder, recurrent, unspecified: F33.9

## 2016-11-08 LAB — COMPREHENSIVE METABOLIC PANEL
ALBUMIN: 4.6 g/dL (ref 3.5–5.0)
ALT: 17 U/L (ref 14–54)
ANION GAP: 9 (ref 5–15)
AST: 19 U/L (ref 15–41)
Alkaline Phosphatase: 77 U/L (ref 38–126)
BILIRUBIN TOTAL: 1 mg/dL (ref 0.3–1.2)
BUN: 10 mg/dL (ref 6–20)
CO2: 27 mmol/L (ref 22–32)
Calcium: 9.3 mg/dL (ref 8.9–10.3)
Chloride: 102 mmol/L (ref 101–111)
Creatinine, Ser: 0.62 mg/dL (ref 0.44–1.00)
GFR calc Af Amer: >60 mL/min (ref >60)
GFR calc non Af Amer: >60 mL/min (ref >60)
GLUCOSE: 92 mg/dL (ref 65–99)
POTASSIUM: 3.6 mmol/L (ref 3.5–5.1)
SODIUM: 138 mmol/L (ref 135–145)
TOTAL PROTEIN: 8.4 g/dL — AB (ref 6.5–8.1)

## 2016-11-08 LAB — URINE DRUG SCREEN, QUALITATIVE (ARMC ONLY)
AMPHETAMINES, UR SCREEN: NOT DETECTED
BENZODIAZEPINE, UR SCRN: NOT DETECTED
Barbiturates, Ur Screen: NOT DETECTED
Cannabinoid 50 Ng, Ur ~~LOC~~: NOT DETECTED
Cocaine Metabolite,Ur ~~LOC~~: NOT DETECTED
MDMA (Ecstasy)Ur Screen: NOT DETECTED
METHADONE SCREEN, URINE: NOT DETECTED
Opiate, Ur Screen: NOT DETECTED
Phencyclidine (PCP) Ur S: NOT DETECTED
TRICYCLIC, UR SCREEN: NOT DETECTED

## 2016-11-08 LAB — CBC
HEMATOCRIT: 40.6 % (ref 35.0–47.0)
Hemoglobin: 14.4 g/dL (ref 12.0–16.0)
MCH: 30.6 pg (ref 26.0–34.0)
MCHC: 35.5 g/dL (ref 32.0–36.0)
MCV: 86.2 fL (ref 80.0–100.0)
PLATELETS: 412 10*3/uL (ref 150–440)
RBC: 4.71 MIL/uL (ref 3.80–5.20)
RDW: 13.4 % (ref 11.5–14.5)
WBC: 6.3 10*3/uL (ref 3.6–11.0)

## 2016-11-08 LAB — SALICYLATE LEVEL

## 2016-11-08 LAB — POCT PREGNANCY, URINE: PREG TEST UR: NEGATIVE

## 2016-11-08 LAB — ETHANOL

## 2016-11-08 LAB — ACETAMINOPHEN LEVEL

## 2016-11-08 MED ORDER — HYDROCHLOROTHIAZIDE 12.5 MG PO CAPS
12.5000 mg | ORAL_CAPSULE | Freq: Every day | ORAL | Status: DC
  Administered 2016-11-08: 12.5 mg via ORAL
  Filled 2016-11-08: qty 1

## 2016-11-08 MED ORDER — TRAZODONE HCL 100 MG PO TABS: 100.0000 mg | ORAL_TABLET | Freq: Every evening | ORAL | Status: DC | PRN

## 2016-11-08 MED ORDER — FLUOXETINE HCL 20 MG PO CAPS
20.0000 mg | ORAL_CAPSULE | Freq: Every day | ORAL | Status: DC
  Administered 2016-11-08: 20 mg via ORAL
  Filled 2016-11-08: qty 1

## 2016-11-08 NOTE — ED Notes (Signed)
Pt asked nurse how long will she be admitted in hospital. Pt asked if it were guaranteed she would be admitted, nurse told pt, it wasn't guaranteed, she is in for observation. Pt then asked if admission was based on how she acts whether or not she would be admitted. Informed patient it would be based on doctors assessment.

## 2016-11-08 NOTE — ED Notes (Signed)
Patient received to unit. Alert and oriented x4. Pt denies SI, HI, AVH at present. Endorses depression, hopelessness and sadness. Pt states having marital problems and son recently diagnosed with Tourette syndrome. Pt pleasant and cooperative, but sad and flat. Encouragement and support offered. Fluids and nutrition offered. Pt currently in room watching television in no distress.

## 2016-11-08 NOTE — Consult Note (Signed)
[facility] Consult   Reason for Consult:  Consult for 26 year old woman who presented voluntarily to the emergency room for evaluation of depression Referring Physician:  Eual Fines Patient Identification: Tara Woodward MRN:  [id number] Principal Diagnosis: Severe major depression, single episode, without psychotic features [facility]) Diagnosis:   Patient Active Problem List   Diagnosis Date Noted  . Severe major depression, single episode, without psychotic features ([place]) [F32.2] 11/08/2016  . HTN (hypertension) [I10] 01/24/2013    Total Time spent with patient: 1 hour  Subjective:   Tara Woodward is a 26 y.o. female patient admitted with "I've been depressed for a long time".  HPI:  Patient interviewed chart reviewed. Patient reports that she is been having problems with depression since 2009 when she had a postpartum depression. She never got any specific treatment at that time and it gradually got better but for the past month things of been much worse. Mood is down and sad and negative all the time. Trouble concentrating. Lost all interest in enjoyable activities. Has a lot of trouble falling asleep at night. Appetite is quite variable. Patient says that she's had passive vague suicidal like thoughts but no intention or plan of acting on them. No homicidal ideation. No psychotic symptoms. She is not drinking or using any drugs. She has not contacted any provider for any kind of treatment yet. Patient is a mother of twin children ages 79. The boy was recently diagnosed with Tourette's condition which has been a new stressor for her.  Social history: Married. Has 2 children at home ages age. Patient does not work outside the home.  Medical history: She has a history of hypertension but says that she had not been on medicine for it. She was just recently diagnosed with thyroid disease although she says in years past she had been told she had thyroid problems and was not getting any  treatment for it.  Substance abuse history: Denies alcohol or drug abuse denies any past alcohol or drug abuse problems.  Past Psychiatric History: Patient says she's had symptoms of depression since her 2 children were born in 2010 but she never got any treatment for it. Never been in a psychiatric hospital. No history of any psychiatric medicine. No history of suicide attempts or violence.  Risk to Self: Is patient at risk for suicide?: No Risk to Others:   Prior Inpatient Therapy:   Prior Outpatient Therapy:    Past Medical History:  Past Medical History:  Diagnosis Date  . High blood pressure   . Hyperthyroidism   . Morbid obesity with BMI of 45.0-49.9, adult [facility])    History reviewed. No pertinent surgical history. Family History:  Family History  Problem Relation Age of Onset  . Diabetes Maternal Aunt   . Cancer Maternal Aunt        breast  . Hypertension Maternal Aunt   . Hypertension Maternal Grandmother   . Diabetes Maternal Grandfather   . Cancer Maternal Grandfather        lung ca   Family Psychiatric  History: She reports that there are people on both sides of her family with depression. Also more than one person in the family who has Tourette's disease and at least 1 uncle who committed suicide. Social History:  History  Alcohol Use No     History  Drug Use No    Social History   Social History  . Marital status: Single    Spouse name: N/A  .  Number of children: N/A  . Years of education: N/A   Social History Main Topics  . Smoking status: Never Smoker  . Smokeless tobacco: Never Used  . Alcohol use No  . Drug use: No  . Sexual activity: Yes    Partners: Male    Birth control/ protection: IUD   Other Topics Concern  . None   Social History Narrative  . None   Additional Social History:    Allergies:  No Known Allergies  Labs:  Results for orders placed or performed during the hospital encounter of 11/08/16 (from the past 48 hour(s))   Comprehensive metabolic panel     Status: Abnormal   Collection Time: 11/08/16  7:22 AM  Result Value Ref Range   Sodium 138 135 - 145 mmol/L   Potassium 3.6 3.5 - 5.1 mmol/L   Chloride 102 101 - 111 mmol/L   CO2 27 22 - 32 mmol/L   Glucose, Bld 92 65 - 99 mg/dL   BUN 10 6 - 20 mg/dL   Creatinine, Ser 0.62 0.44 - 1.00 mg/dL   Calcium 9.3 8.9 - 10.3 mg/dL   Total Protein 8.4 (H) 6.5 - 8.1 g/dL   Albumin 4.6 3.5 - 5.0 g/dL   AST 19 15 - 41 U/L   ALT 17 14 - 54 U/L   Alkaline Phosphatase 77 38 - 126 U/L   Total Bilirubin 1.0 0.3 - 1.2 mg/dL   GFR calc non Af Amer >60 >60 mL/min   GFR calc Af Amer >60 >60 mL/min    Comment: (NOTE) The eGFR has been calculated using the CKD EPI equation. This calculation has not been validated in all clinical situations. eGFR's persistently <60 mL/min signify possible Chronic Kidney Disease.    Anion gap 9 5 - 15  Ethanol     Status: None   Collection Time: 11/08/16  7:22 AM  Result Value Ref Range   Alcohol, Ethyl (B) <5 <5 mg/dL    Comment:        LOWEST DETECTABLE LIMIT FOR SERUM ALCOHOL IS 5 mg/dL FOR MEDICAL PURPOSES ONLY   Salicylate level     Status: None   Collection Time: 11/08/16  7:22 AM  Result Value Ref Range   Salicylate Lvl <7.4 2.8 - 30.0 mg/dL  Acetaminophen level     Status: Abnormal   Collection Time: 11/08/16  7:22 AM  Result Value Ref Range   Acetaminophen (Tylenol), Serum <10 (L) 10 - 30 ug/mL    Comment:        THERAPEUTIC CONCENTRATIONS VARY SIGNIFICANTLY. A RANGE OF 10-30 ug/mL MAY BE AN EFFECTIVE CONCENTRATION FOR MANY PATIENTS. HOWEVER, SOME ARE BEST TREATED AT CONCENTRATIONS OUTSIDE THIS RANGE. ACETAMINOPHEN CONCENTRATIONS >150 ug/mL AT 4 HOURS AFTER INGESTION AND >50 ug/mL AT 12 HOURS AFTER INGESTION ARE OFTEN ASSOCIATED WITH TOXIC REACTIONS.   cbc     Status: None   Collection Time: 11/08/16  7:22 AM  Result Value Ref Range   WBC 6.3 3.6 - 11.0 K/uL   RBC 4.71 3.80 - 5.20 MIL/uL   Hemoglobin  14.4 12.0 - 16.0 g/dL   HCT 40.6 35.0 - 47.0 %   MCV 86.2 80.0 - 100.0 fL   MCH 30.6 26.0 - 34.0 pg   MCHC 35.5 32.0 - 36.0 g/dL   RDW 13.4 11.5 - 14.5 %   Platelets 412 150 - 440 K/uL  Urine Drug Screen, Qualitative     Status: None   Collection Time: 11/08/16  7:22  AM  Result Value Ref Range   Tricyclic, Ur Screen NONE DETECTED NONE DETECTED   Amphetamines, Ur Screen NONE DETECTED NONE DETECTED   MDMA (Ecstasy)Ur Screen NONE DETECTED NONE DETECTED   Cocaine Metabolite,Ur [place] NONE DETECTED NONE DETECTED   Opiate, Ur Screen NONE DETECTED NONE DETECTED   Phencyclidine (PCP) Ur S NONE DETECTED NONE DETECTED   Cannabinoid 50 Ng, Ur [place] NONE DETECTED NONE DETECTED   Barbiturates, Ur Screen NONE DETECTED NONE DETECTED   Benzodiazepine, Ur Scrn NONE DETECTED NONE DETECTED   Methadone Scn, Ur NONE DETECTED NONE DETECTED    Comment: (NOTE) 785  Tricyclics, urine               Cutoff 1000 ng/mL 200  Amphetamines, urine             Cutoff 1000 ng/mL 300  MDMA (Ecstasy), urine           Cutoff 500 ng/mL 400  Cocaine Metabolite, urine       Cutoff 300 ng/mL 500  Opiate, urine                   Cutoff 300 ng/mL 600  Phencyclidine (PCP), urine      Cutoff 25 ng/mL 700  Cannabinoid, urine              Cutoff 50 ng/mL 800  Barbiturates, urine             Cutoff 200 ng/mL 900  Benzodiazepine, urine           Cutoff 200 ng/mL 1000 Methadone, urine                Cutoff 300 ng/mL 1100 1200 The urine drug screen provides only a preliminary, unconfirmed 1300 analytical test result and should not be used for non-medical 1400 purposes. Clinical consideration and professional judgment should 1500 be applied to any positive drug screen result due to possible 1600 interfering substances. A more specific alternate chemical method 1700 must be used in order to obtain a confirmed analytical result.  1800 Gas chromato graphy / mass spectrometry (GC/MS) is the preferred 1900 confirmatory method.    Pregnancy, urine POC     Status: None   Collection Time: 11/08/16  7:36 AM  Result Value Ref Range   Preg Test, Ur NEGATIVE NEGATIVE    Comment:        THE SENSITIVITY OF THIS METHODOLOGY IS >24 mIU/mL     Current Facility-Administered Medications  Medication Dose Route Frequency Provider Last Rate Last Dose  . FLUoxetine (PROZAC) capsule 20 mg  20 mg Oral Daily Clapacs, John T, MD      . hydrochlorothiazide (MICROZIDE) capsule 12.5 mg  12.5 mg Oral Daily Alfred Levins, [place], MD   12.5 mg at 11/08/16 0813  . levonorgestrel (MIRENA) 20 MCG/24HR IUD 1 each  1 Intra Uterine Device Intrauterine Once Anyanwu, Ugonna A, MD      . traZODone (DESYREL) tablet 100 mg  100 mg Oral QHS PRN Clapacs, Madie [place], MD       Current Outpatient Prescriptions  Medication Sig Dispense Refill  . levonorgestrel (MIRENA) 20 MCG/24HR IUD 1 each by Intrauterine route once.      Musculoskeletal: Strength & Muscle Tone: within normal limits Gait & Station: normal Patient leans: N/A  Psychiatric Specialty Exam: Physical Exam  Nursing note and vitals reviewed. Constitutional: She appears well-developed and well-nourished.  HENT:  Head: Normocephalic and atraumatic.  Eyes: Pupils are equal, round, and reactive  to light. Conjunctivae are normal.  Neck: Normal range of motion.  Cardiovascular: Regular rhythm and normal heart sounds.   Respiratory: Effort normal. No respiratory distress.  GI: Soft.  Musculoskeletal: Normal range of motion.  Neurological: She is alert.  Skin: Skin is warm and dry.  Psychiatric: Judgment normal. Her speech is delayed. She is slowed and withdrawn. Cognition and memory are normal. She exhibits a depressed mood. She expresses suicidal ideation. She expresses no suicidal plans.    Review of Systems  Constitutional: Negative.   HENT: Negative.   Eyes: Negative.   Respiratory: Negative.   Cardiovascular: Negative.   Gastrointestinal: Negative.   Musculoskeletal: Negative.    Skin: Negative.   Neurological: Negative.   Psychiatric/Behavioral: Positive for depression and suicidal ideas. Negative for hallucinations, memory loss and substance abuse. The patient is nervous/anxious and has insomnia.     Blood pressure (!) 148/104, pulse 86, temperature 98.2 F (36.8 C), temperature source Oral, resp. rate 18, height '5\' 5"'$  (1.651 m), weight 237 lb (107.5 kg), SpO2 99 %.Body mass index is 39.44 kg/m.  General Appearance: Casual  Eye Contact:  Good  Speech:  Slow  Volume:  Decreased  Mood:  Depressed and Dysphoric  Affect:  Congruent  Thought Process:  Goal Directed  Orientation:  Full (Time, Place, and Person)  Thought Content:  Logical  Suicidal Thoughts:  Yes.  without intent/plan  Homicidal Thoughts:  No  Memory:  Immediate;   Good Recent;   Fair Remote;   Fair  Judgement:  Fair  Insight:  Fair  Psychomotor Activity:  Psychomotor Retardation  Concentration:  Concentration: Fair  Recall:  [employer] of Knowledge:  Fair  Language:  Fair  Akathisia:  No  Handed:  Right  AIMS (if indicated):     Assets:  Desire for Improvement Housing Social Support  ADL's:  Intact  Cognition:  WNL  Sleep:        Treatment Plan Summary: Daily contact with patient to assess and evaluate symptoms and progress in treatment, Medication management and Plan 26 year old woman with major depression single episode no evidence of psychosis. Patient states that she does not feel comfortable going home. She feels like home is in a very big increased stress for her and she is worried about worsening suicidal thoughts or out of control behavior. She feels much more comfortable with admission to the psychiatric ward. Patient will be started on Prozac 20 mg a day as well as trazodone 100 mg at night as needed for sleep. She agrees to the medication plan. Patient is aware that there may be a way to getting into the hospital but still prefers to stay. We will get an EKG and get a full  set of labs on admission.  Disposition: Recommend psychiatric Inpatient admission when medically cleared. Supportive therapy provided about ongoing stressors.  Alethia Berthold, MD 11/08/2016 2:25 PM

## 2016-11-08 NOTE — ED Notes (Signed)
BEHAVIORAL HEALTH ROUNDING Patient sleeping: No. Patient alert and oriented: yes Behavior appropriate: Yes.  ; If no, describe:  Nutrition and fluids offered: yes Toileting and hygiene offered: Yes  Sitter present: q15 minute observations and security  monitoring Law enforcement present: Yes  ODS  

## 2016-11-08 NOTE — ED Provider Notes (Signed)
[facility] Emergency Department Provider Note  ____________________________________________  Time seen: Approximately 7:57 AM  I have reviewed the triage vital signs and the nursing notes.   HISTORY  Chief Complaint Depression   HPI Tara Woodward is a 26 y.o. female with a history of hypertension and postpartum depression who presents for evaluation of depression. Patient reports one episode of depression right after having her twins 8 years ago. She states that for the last month she's been having marital problems and has been feeling very depressed. A few days ago patient reports that she was thinking that her family would be better off without her but she denies ever having thoughts of killing herself or plan. She has never tried to kill herself in the past. She denies drinking alcohol. Endorses decreased appetite. She denies any homicidal ideation. She also endorses that she hasn't been taking her antihypertensives for 8 years and hasn't seen a doctor for this long. She does not have a psychiatrist. She denies headache, dizziness, chest pain, shortness of breath.  Past Medical History:  Diagnosis Date  . High blood pressure   . Hyperthyroidism   . Morbid obesity with BMI of 45.0-49.9, adult [facility])     Patient Active Problem List   Diagnosis Date Noted  . HTN (hypertension) 01/24/2013    History reviewed. No pertinent surgical history.  Prior to Admission medications   Medication Sig Start Date End Date Taking? Authorizing Provider  levonorgestrel (MIRENA) 20 MCG/24HR IUD 1 each by Intrauterine route once.   Yes [provider]    Allergies Patient has no known allergies.  Family History  Problem Relation Age of Onset  . Diabetes Maternal Aunt   . Cancer Maternal Aunt        breast  . Hypertension Maternal Aunt   . Hypertension Maternal Grandmother   . Diabetes Maternal Grandfather   . Cancer Maternal Grandfather        lung ca      Social History Social History  Substance Use Topics  . Smoking status: Never Smoker  . Smokeless tobacco: Never Used  . Alcohol use No    Review of Systems  Constitutional: Negative for fever. Eyes: Negative for visual changes. ENT: Negative for sore throat. Neck: No neck pain  Cardiovascular: Negative for chest pain. Respiratory: Negative for shortness of breath. Gastrointestinal: Negative for abdominal pain, vomiting or diarrhea. Genitourinary: Negative for dysuria. Musculoskeletal: Negative for back pain. Skin: Negative for rash. Neurological: Negative for headaches, weakness or numbness. Psych: No SI or HI. + depression  ____________________________________________   PHYSICAL EXAM:  VITAL SIGNS: ED Triage Vitals [11/08/16 0718]  Enc Vitals Group     BP (!) 148/104     Pulse Rate 86     Resp 18     Temp 98.2 F (36.8 C)     Temp Source Oral     SpO2 99 %     Weight 237 lb (107.5 kg)     Height 5\' 5"  (1.651 m)     Head Circumference      Peak Flow      Pain Score      Pain Loc      Pain Edu?      Excl. in GC?     Constitutional: Alert and oriented. Well appearing and in no apparent distress. HEENT:      Head: Normocephalic and atraumatic.         Eyes: Conjunctivae are normal. Sclera is  non-icteric.       Mouth/Throat: Mucous membranes are moist.       Neck: Supple with no signs of meningismus. Cardiovascular: Regular rate and rhythm. No murmurs, gallops, or rubs. 2+ symmetrical distal pulses are present in all extremities. No JVD. Respiratory: Normal respiratory effort. Lungs are clear to auscultation bilaterally. No wheezes, crackles, or rhonchi.  Gastrointestinal: Soft, non tender, and non distended with positive bowel sounds. No rebound or guarding. Genitourinary: No CVA tenderness. Musculoskeletal: Nontender with normal range of motion in all extremities. No edema, cyanosis, or erythema of extremities. Neurologic: Normal speech and language.  Face is symmetric. Moving all extremities. No gross focal neurologic deficits are appreciated. Skin: Skin is warm, dry and intact. No rash noted. Psychiatric: Mood and affect are depressed. Speech and behavior are normal.  ____________________________________________   LABS (all labs ordered are listed, but only abnormal results are displayed)  Labs Reviewed  COMPREHENSIVE METABOLIC PANEL - Abnormal; Notable for the following:       Result Value   Total Protein 8.4 (*)    All other components within normal limits  ACETAMINOPHEN LEVEL - Abnormal; Notable for the following:    Acetaminophen (Tylenol), Serum <10 (*)    All other components within normal limits  ETHANOL  SALICYLATE LEVEL  CBC  URINE DRUG SCREEN, QUALITATIVE ([facility] ONLY)  POCT PREGNANCY, URINE   ____________________________________________  EKG  none  ____________________________________________  RADIOLOGY  none  ____________________________________________   PROCEDURES  Procedure(s) performed: None Procedures Critical Care performed:  None ____________________________________________   INITIAL IMPRESSION / ASSESSMENT AND PLAN / ED COURSE   26 y.o. female with a history of hypertension and postpartum depression who presents for evaluation of depression. No SI or HI. Patient is here voluntarily. Will consult psychiatry. Will check basic labs. Will restart patient on HCTZ.    _________________________ 1:02 PM on 11/08/2016 -----------------------------------------  patient evaluated by psychiatrist Dr. Toni Amend and will be voluntarily admitted for management of her depression. Blood work with no acute findings.  Pertinent labs & imaging results that were available during my care of the patient were reviewed by me and considered in my medical decision making (see chart for details).    ____________________________________________   FINAL CLINICAL IMPRESSION(S) / ED DIAGNOSES  Final diagnoses:   Depression, unspecified depression type      NEW MEDICATIONS STARTED DURING THIS VISIT:  New Prescriptions   No medications on file     Note:  This document was prepared using Dragon voice recognition software and may include unintentional dictation errors.    Don Perking, [place], MD 11/08/16 1302

## 2016-11-08 NOTE — ED Notes (Signed)
Pt. Alert and oriented, warm and dry, in no distress. Pt. Denies SI, HI, and AVH. Pt is stating she is wanting to go home to her children. That after sitting in the unit and thinking, she feels like she does not need any inpatient services. Pt states Dr. Harlene Ramuslapps gave her the option to stay or be discharged. This Clinical research associatewriter asked patient about her plan once she is discharged. Patient states she is going to go to either [facility] for psych services or her primary care provider first thing in the AM.

## 2016-11-08 NOTE — ED Notes (Signed)
Patient denies Si/HI/AVH. Discharge instructions reviewed and patient verbally states understanding of discharge paperwork.

## 2016-11-08 NOTE — ED Notes (Signed)
pts mother took clothing home, mother given password and unit rules per pts request

## 2016-11-08 NOTE — ED Triage Notes (Signed)
Pt arrives with mother with complaints of depression, states hx of same but is not on any medication, denies any use of drugs or etoh, states thoughts of hurting herself but states she has not tried, awake and alert in no acute distress

## 2016-11-08 NOTE — ED Notes (Signed)

## 2016-11-08 NOTE — Discharge Instructions (Signed)
Please seek medical attention and help for any thoughts about wanting to harm yourself, harm others, any concerning change in behavior, severe depression, inappropriate drug use or any other new or concerning symptoms. ° °

## 2016-11-08 NOTE — ED Notes (Signed)
Patient requesting phone to call husband

## 2016-11-08 NOTE — ED Notes (Signed)
Patient is resting comfortably. 

## 2016-11-08 NOTE — ED Notes (Signed)
EDP speaking with patient about discharge.

## 2016-11-08 NOTE — ED Notes (Signed)
She had a visit from her mother and husband - observed by Alfredia ClientMary Woodward pt relations

## 2016-11-08 NOTE — ED Notes (Signed)
Psychiatrist consulting at this time

## 2016-11-08 NOTE — ED Notes (Signed)
Meal tray given to patient.

## 2016-11-08 NOTE — ED Provider Notes (Signed)
-----------------------------------------   7:34 PM on 11/08/2016 -----------------------------------------  Patient stated that she would like to go home at this point. I went and discussed with patient. She states that she decided she would rather be home with family and children. She denies any thoughts but wanted to harm herself or others. She is currently voluntary. She states that she will contact her primary care doctor and I will give her [facility] information.   Phineas SemenGoodman, Jayven Naill, MD 11/08/16 Barry Brunner1935

## 2016-11-17 ENCOUNTER — Encounter: Payer: Self-pay | Admitting: Emergency Medicine

## 2016-11-17 ENCOUNTER — Emergency Department
Admission: EM | Admit: 2016-11-17 | Discharge: 2016-11-17 | Disposition: A | Discharge status: Home / Self Care | Payer: Self-pay | Attending: Student in an Organized Health Care Education/Training Program | Admitting: Student in an Organized Health Care Education/Training Program

## 2016-11-17 DIAGNOSIS — R51 Headache: Secondary | ICD-10-CM | POA: Insufficient documentation

## 2016-11-17 DIAGNOSIS — R5383 Other fatigue: Secondary | ICD-10-CM | POA: Insufficient documentation

## 2016-11-17 DIAGNOSIS — R519 Headache, unspecified: Secondary | ICD-10-CM

## 2016-11-17 DIAGNOSIS — Z79899 Other long term (current) drug therapy: Secondary | ICD-10-CM | POA: Insufficient documentation

## 2016-11-17 DIAGNOSIS — I10 Essential (primary) hypertension: Secondary | ICD-10-CM | POA: Insufficient documentation

## 2016-11-17 LAB — URINALYSIS, COMPLETE (UACMP) WITH MICROSCOPIC
Bilirubin Urine: NEGATIVE
Glucose, UA: NEGATIVE mg/dL
Hgb urine dipstick: NEGATIVE
KETONES UR: NEGATIVE mg/dL
Nitrite: NEGATIVE
PROTEIN: NEGATIVE mg/dL
Specific Gravity, Urine: 1.012 (ref 1.005–1.030)
pH: 7 (ref 5.0–8.0)

## 2016-11-17 LAB — CBC
HCT: 44.7 % (ref 35.0–47.0)
HEMOGLOBIN: 15.7 g/dL (ref 12.0–16.0)
MCH: 29.9 pg (ref 26.0–34.0)
MCHC: 35.1 g/dL (ref 32.0–36.0)
MCV: 85.2 fL (ref 80.0–100.0)
Platelets: 488 10*3/uL — ABNORMAL HIGH (ref 150–440)
RBC: 5.25 MIL/uL — AB (ref 3.80–5.20)
RDW: 13.3 % (ref 11.5–14.5)
WBC: 10.9 10*3/uL (ref 3.6–11.0)

## 2016-11-17 LAB — POCT PREGNANCY, URINE: PREG TEST UR: NEGATIVE

## 2016-11-17 LAB — BASIC METABOLIC PANEL
ANION GAP: 12 (ref 5–15)
BUN: 8 mg/dL (ref 6–20)
CHLORIDE: 97 mmol/L — AB (ref 101–111)
CO2: 26 mmol/L (ref 22–32)
Calcium: 10 mg/dL (ref 8.9–10.3)
Creatinine, Ser: 0.7 mg/dL (ref 0.44–1.00)
GFR calc Af Amer: >60 mL/min (ref >60)
GFR calc non Af Amer: >60 mL/min (ref >60)
GLUCOSE: 101 mg/dL — AB (ref 65–99)
POTASSIUM: 4.5 mmol/L (ref 3.5–5.1)
Sodium: 135 mmol/L (ref 135–145)

## 2016-11-17 MED ORDER — DIPHENHYDRAMINE HCL 50 MG/ML IJ SOLN: 12.5000 mg | Freq: Once | INTRAMUSCULAR | Status: DC

## 2016-11-17 MED ORDER — PROCHLORPERAZINE EDISYLATE 5 MG/ML IJ SOLN: 10.0000 mg | Freq: Once | INTRAMUSCULAR | Status: DC

## 2016-11-17 MED ORDER — ACETAMINOPHEN 325 MG PO TABS
650.0000 mg | ORAL_TABLET | Freq: Once | ORAL | Status: DC
  Filled 2016-11-17: qty 2

## 2016-11-17 MED ORDER — DEXAMETHASONE SODIUM PHOSPHATE 10 MG/ML IJ SOLN: 10.0000 mg | Freq: Once | INTRAMUSCULAR | Status: DC

## 2016-11-17 MED ORDER — SODIUM CHLORIDE 0.9 % IV BOLUS (SEPSIS): 1000.0000 mL | Freq: Once | INTRAVENOUS | Status: DC

## 2016-11-17 NOTE — ED Notes (Signed)
Spoke to pt who states she is feeling much better, headache currently 4/10. States she would like to go home and would prefer not to have IV started and believes IV medications are not necessary at this time. EDP notified.

## 2016-11-17 NOTE — Discharge Instructions (Signed)

## 2016-11-17 NOTE — ED Triage Notes (Signed)
Patient denies to ED via POV from home with generalized weakness that started today. No slurred speech or facial droop noted. Patient denies N/V/D. Patient states she feels faint. Denies syncope.

## 2016-11-17 NOTE — ED Provider Notes (Signed)
[facility] Emergency Department Provider Note    First MD Initiated Contact with Patient 11/17/16 1701     (approximate)  I have reviewed the triage vital signs and the nursing notes.   HISTORY  Chief Complaint Fatigue    HPI Tara Woodward is a 26 y.o. female chief complaint of headache and generalized fatigue that started yesterday. It measured fevers. No numbness or tingling. States that she has felt dizzy particularly when standing up and admits to decreased oral intake today due to having a headache. States the headache is mild to moderate. Diffuse. No neck stiffness.does admit a history of depression and is on antidepressant medications. Denies any SI or HI.   Past Medical History:  Diagnosis Date  . High blood pressure   . Hyperthyroidism   . Morbid obesity with BMI of 45.0-49.9, adult (HCC)    Family History  Problem Relation Age of Onset  . Diabetes Maternal Aunt   . Cancer Maternal Aunt        breast  . Hypertension Maternal Aunt   . Hypertension Maternal Grandmother   . Diabetes Maternal Grandfather   . Cancer Maternal Grandfather        lung ca   History reviewed. No pertinent surgical history. Patient Active Problem List   Diagnosis Date Noted  . Severe major depression, single episode, without psychotic features (HCC) 11/08/2016  . HTN (hypertension) 01/24/2013      Prior to Admission medications   Medication Sig Start Date End Date Taking? Authorizing Provider  levonorgestrel (MIRENA) 20 MCG/24HR IUD 1 each by Intrauterine route once.   Yes [provider]    Allergies Patient has no known allergies.    Social History Social History  Substance Use Topics  . Smoking status: Never Smoker  . Smokeless tobacco: Never Used  . Alcohol use No    Review of Systems Patient denies headaches, rhinorrhea, blurry vision, numbness, shortness of breath, chest pain, edema, cough, abdominal pain, nausea, vomiting,  diarrhea, dysuria, fevers, rashes or hallucinations unless otherwise stated above in HPI. ____________________________________________   PHYSICAL EXAM:  VITAL SIGNS: Vitals:   11/17/16 1534  BP: (!) 154/113  Pulse: 98  Resp: 15  Temp: 98.3 F (36.8 C)  SpO2: 100%    Constitutional: Alert and oriented. Well appearing and in no acute distress. Eyes: Conjunctivae are normal.  Head: Atraumatic. Nose: No congestion/rhinnorhea. Mouth/Throat: Mucous membranes are moist.   Neck: No stridor. Painless ROM.  Cardiovascular: Normal rate, regular rhythm. Grossly normal heart sounds.  Good peripheral circulation. Respiratory: Normal respiratory effort.  No retractions. Lungs CTAB. Gastrointestinal: Soft and nontender. No distention. No abdominal bruits. No CVA tenderness. Genitourinary:  Musculoskeletal: No lower extremity tenderness nor edema.  No joint effusions. Neurologic:  CN- intact.  No facial droop, Normal FNF.  Normal heel to shin.  Sensation intact bilaterally. Normal speech and language. No gross focal neurologic deficits are appreciated. No gait instability. Skin:  Skin is warm, dry and intact. No rash noted. Psychiatric: Mood and affect are normal. Speech and behavior are normal.  ____________________________________________   LABS (all labs ordered are listed, but only abnormal results are displayed)  Results for orders placed or performed during the hospital encounter of 11/17/16 (from the past 24 hour(s))  Urinalysis, Complete w Microscopic     Status: Abnormal   Collection Time: 11/17/16  3:30 PM  Result Value Ref Range   Color, Urine YELLOW (A) YELLOW   APPearance HAZY (A) CLEAR  Specific Gravity, Urine 1.012 1.005 - 1.030   pH 7.0 5.0 - 8.0   Glucose, UA NEGATIVE NEGATIVE mg/dL   Hgb urine dipstick NEGATIVE NEGATIVE   Bilirubin Urine NEGATIVE NEGATIVE   Ketones, ur NEGATIVE NEGATIVE mg/dL   Protein, ur NEGATIVE NEGATIVE mg/dL   Nitrite NEGATIVE NEGATIVE    Leukocytes, UA MODERATE (A) NEGATIVE   RBC / HPF 0-5 0 - 5 RBC/hpf   WBC, UA 0-5 0 - 5 WBC/hpf   Bacteria, UA RARE (A) NONE SEEN   Squamous Epithelial / LPF 6-30 (A) NONE SEEN   Mucus PRESENT   Basic metabolic panel     Status: Abnormal   Collection Time: 11/17/16  3:33 PM  Result Value Ref Range   Sodium 135 135 - 145 mmol/L   Potassium 4.5 3.5 - 5.1 mmol/L   Chloride 97 (L) 101 - 111 mmol/L   CO2 26 22 - 32 mmol/L   Glucose, Bld 101 (H) 65 - 99 mg/dL   BUN 8 6 - 20 mg/dL   Creatinine, Ser 1.61 0.44 - 1.00 mg/dL   Calcium 09.6 8.9 - 04.5 mg/dL   GFR calc non Af Amer >60 >60 mL/min   GFR calc Af Amer >60 >60 mL/min   Anion gap 12 5 - 15  CBC     Status: Abnormal   Collection Time: 11/17/16  3:33 PM  Result Value Ref Range   WBC 10.9 3.6 - 11.0 K/uL   RBC 5.25 (H) 3.80 - 5.20 MIL/uL   Hemoglobin 15.7 12.0 - 16.0 g/dL   HCT 40.9 81.1 - 91.4 %   MCV 85.2 80.0 - 100.0 fL   MCH 29.9 26.0 - 34.0 pg   MCHC 35.1 32.0 - 36.0 g/dL   RDW 78.2 95.6 - 21.3 %   Platelets 488 (H) 150 - 440 K/uL  Pregnancy, urine POC     Status: None   Collection Time: 11/17/16  3:47 PM  Result Value Ref Range   Preg Test, Ur NEGATIVE NEGATIVE   ____________________________________________  EKG My review and personal interpretation at Time: 15:34   Indication: dizziness  Rate: 100  Rhythm: sinus Axis: normal Other: normal intervals, no stemi, no depressions ____________________________________________  RADIOLOGY  I personally reviewed all radiographic images ordered to evaluate for the above acute complaints and reviewed radiology reports and findings.  These findings were personally discussed with the patient.  Please see medical record for radiology report.  ____________________________________________   PROCEDURES  Procedure(s) performed:  Procedures    Critical Care performed: no ____________________________________________   INITIAL IMPRESSION / ASSESSMENT AND PLAN / ED  COURSE  Pertinent labs & imaging results that were available during my care of the patient were reviewed by me and considered in my medical decision making (see chart for details).  DDX: dehdyration, tension, migraine, unlikely meningitis, CVST  Tara Woodward is a 26 y.o. who presents to the ED with with HA for last day. Not worst HA ever. Gradual onset. HA similar to previous episodes. Denies focal neurologic symptoms. Denies trauma. No fevers or neck pain. No vision loss. Afebrile in ED. VSS. Exam as above. No meningeal signs. No CN, motor, sensory or cerebellar deficits. Temporal arteries palpable and non-tender. Appears well and non-toxic.  Likely tension, non-specific or possible migraine HA. Clinical picture is not consistent with ICH, SAH, SDH, EDH, TIA, or CVA. No concern for meningitis or encephalitis. No concern for GCA/Temporal arteritis.  Pain improved. Repeat neuro exam is again without focal deficit,  nuchal rigidity or evidence of meningeal irritation.  Stable to D/C home, follow up with PCP or Neurology if persistent recurrent Has.  Have discussed with the patient and available family all diagnostics and treatments performed thus far and all questions were answered to the best of my ability. The patient demonstrates understanding and agreement with plan.        ____________________________________________   FINAL CLINICAL IMPRESSION(S) / ED DIAGNOSES  Final diagnoses:  Other fatigue  Acute nonintractable headache, unspecified headache type      NEW MEDICATIONS STARTED DURING THIS VISIT:  New Prescriptions   No medications on file     Note:  This document was prepared using Dragon voice recognition software and may include unintentional dictation errors.    Willy Eddy, MD 11/17/16 [phone]

## 2017-06-06 ENCOUNTER — Encounter: Payer: Self-pay | Admitting: Emergency Medicine

## 2017-06-06 ENCOUNTER — Emergency Department: Payer: Medicaid Other

## 2017-06-06 ENCOUNTER — Emergency Department
Admission: EM | Admit: 2017-06-06 | Discharge: 2017-06-06 | Disposition: A | Discharge status: Home / Self Care | Payer: Medicaid Other | Attending: Emergency Medicine | Admitting: Emergency Medicine

## 2017-06-06 ENCOUNTER — Other Ambulatory Visit: Payer: Self-pay

## 2017-06-06 DIAGNOSIS — W19XXXA Unspecified fall, initial encounter: Secondary | ICD-10-CM

## 2017-06-06 DIAGNOSIS — I10 Essential (primary) hypertension: Secondary | ICD-10-CM | POA: Insufficient documentation

## 2017-06-06 DIAGNOSIS — M25461 Effusion, right knee: Secondary | ICD-10-CM | POA: Insufficient documentation

## 2017-06-06 DIAGNOSIS — M179 Osteoarthritis of knee, unspecified: Secondary | ICD-10-CM | POA: Insufficient documentation

## 2017-06-06 DIAGNOSIS — Z79899 Other long term (current) drug therapy: Secondary | ICD-10-CM | POA: Insufficient documentation

## 2017-06-06 MED ORDER — OXYCODONE-ACETAMINOPHEN 5-325 MG PO TABS: 1.0000 | ORAL_TABLET | ORAL | 0 refills | Status: AC | PRN

## 2017-06-06 MED ORDER — IBUPROFEN 800 MG PO TABS: 800.0000 mg | ORAL_TABLET | Freq: Three times a day (TID) | ORAL | 0 refills | Status: DC | PRN

## 2017-06-06 NOTE — ED Provider Notes (Signed)
[facility] Emergency Department Provider Note  ____________________________________________  Time seen: Approximately 12:38 PM  I have reviewed the triage vital signs and the nursing notes.   HISTORY  Chief Complaint Knee Pain    HPI Tara Woodward is a 27 y.o. female that presents to the emergency department for evaluation of right knee pain after injury 5 days ago. She slipped on the carpet while getting the remote. She heard a pop. Pain is sharp and constant. Pain is over the front of her knee. She has been walking since fall. She has taken tylenol. No additional injuries. She did not hit her head or lose consciousness. No numbness or tingling.    Past Medical History:  Diagnosis Date  . High blood pressure   . Hyperthyroidism   . Morbid obesity with BMI of 45.0-49.9, adult [facility])     Patient Active Problem List   Diagnosis Date Noted  . Severe major depression, single episode, without psychotic features (HCC) 11/08/2016  . HTN (hypertension) 01/24/2013    History reviewed. No pertinent surgical history.  Prior to Admission medications   Medication Sig Start Date End Date Taking? Authorizing Provider  ibuprofen (ADVIL,MOTRIN) 800 MG tablet Take 1 tablet (800 mg total) by mouth every 8 (eight) hours as needed. 06/06/17   Enid Derry, PA-C  levonorgestrel (MIRENA) 20 MCG/24HR IUD 1 each by Intrauterine route once.    [provider]  oxyCODONE-acetaminophen (PERCOCET) 5-325 MG tablet Take 1 tablet by mouth every 4 (four) hours as needed for severe pain. 06/06/17 06/06/18  Enid Derry, PA-C    Allergies Patient has no known allergies.  Family History  Problem Relation Age of Onset  . Diabetes Maternal Aunt   . Cancer Maternal Aunt        breast  . Hypertension Maternal Aunt   . Hypertension Maternal Grandmother   . Diabetes Maternal Grandfather   . Cancer Maternal Grandfather        lung ca    Social History Social History    Tobacco Use  . Smoking status: Never Smoker  . Smokeless tobacco: Never Used  Substance Use Topics  . Alcohol use: No  . Drug use: No     Review of Systems  Constitutional: No fever/chills Cardiovascular: No chest pain. Respiratory: No SOB. Gastrointestinal: No abdominal pain.  No nausea, no vomiting.  Musculoskeletal: Positive for knee pain  Skin: Negative for rash, abrasions, lacerations, ecchymosis. Neurological: Negative for headaches, numbness or tingling   ____________________________________________   PHYSICAL EXAM:  VITAL SIGNS: ED Triage Vitals  Enc Vitals Group     BP 06/06/17 1203 (!) 157/103     Pulse Rate 06/06/17 1203 (!) 4     Resp 06/06/17 1203 16     Temp 06/06/17 1203 98.6 F (37 C)     Temp Source 06/06/17 1203 Oral     SpO2 06/06/17 1203 99 %     Weight 06/06/17 1202 238 lb (108 kg)     Height 06/06/17 1202 5\' 6"  (1.676 m)     Head Circumference --      Peak Flow --      Pain Score 06/06/17 1208 7     Pain Loc --      Pain Edu? --      Excl. in GC? --      Constitutional: Alert and oriented. Well appearing and in no acute distress. Eyes: Conjunctivae are normal. PERRL. EOMI. Head: Atraumatic. ENT:  Ears:      Nose: No congestion/rhinnorhea.      Mouth/Throat: Mucous membranes are moist.  Neck: No stridor.  Cardiovascular: Normal rate, regular rhythm.  Good peripheral circulation. Respiratory: Normal respiratory effort without tachypnea or retractions. Lungs CTAB. Good air entry to the bases with no decreased or absent breath sounds. Musculoskeletal: Full range of motion to all extremities. No gross deformities appreciated. Tenderness to palpation over patella. No tenderness to palpation over lateral or medial knee. Pain with extension of knee. Negative anterior drawer, posterior drawer, valgus, varus. Neurologic:  Normal speech and language. No gross focal neurologic deficits are appreciated.  Skin:  Skin is warm, dry and intact. No  rash noted. Psychiatric: Mood and affect are normal. Speech and behavior are normal. Patient exhibits appropriate insight and judgement.   ____________________________________________   LABS (all labs ordered are listed, but only abnormal results are displayed)  Labs Reviewed - No data to display ____________________________________________  EKG   ____________________________________________  RADIOLOGY Lexine Baton, personally viewed and evaluated these images (plain radiographs) as part of my medical decision making, as well as reviewing the written report by the radiologist.  Dg Knee Complete 4 Views Right  Result Date: 06/06/2017 CLINICAL DATA:  Right knee pain after a fall, initial encounter. EXAM: RIGHT KNEE - COMPLETE 4+ VIEW COMPARISON:  None. FINDINGS: There is a joint effusion. Tricompartment osteophytosis. Slight cortical irregularity along the lateral tibial plateau, seen on only 1 oblique image, appears well corticated and may be due to osteophytosis. Slight lateral subluxation of the proximal tibia with respect to the distal femur. There may be mild sclerosis along the medial and lateral tibial plateaus. IMPRESSION: 1. Irregularity along the lateral aspect of the lateral tibial plateau is only seen on one view and is likely projectional, possibly related to osteophytosis. 2. Moderate joint effusion. If there is further concern for an occult fracture, MR without contrast is recommended. 3. Tricompartment osteoarthritis. Electronically Signed   By: Leanna Battles M.D.   On: 06/06/2017 13:16    ____________________________________________    PROCEDURES  Procedure(s) performed:    Procedures    Medications - No data to display   ____________________________________________   INITIAL IMPRESSION / ASSESSMENT AND PLAN / ED COURSE  Pertinent labs & imaging results that were available during my care of the patient were reviewed by me and considered in my medical  decision making (see chart for details).  Review of the [place] CSRS was performed in accordance of the [facility] prior to dispensing any controlled drugs.   Patient presented emergency department for evaluation of knee pain after injury 5 days ago.  Vital signs and exam are reassuring.  X-ray consistent with joint effusion and cannot rule out occult fracture.  Knee immobilizer was placed.  Crutches are given.  Patient will be discharged home with prescriptions for Percocet and ibuprofen.  Patient is to follow up with orthopedics as directed. Patient is given ED precautions to return to the ED for any worsening or new symptoms.     ____________________________________________  FINAL CLINICAL IMPRESSION(S) / ED DIAGNOSES  Final diagnoses:  Effusion of right knee  Fall, initial encounter      NEW MEDICATIONS STARTED DURING THIS VISIT:  ED Discharge Orders        Ordered    oxyCODONE-acetaminophen (PERCOCET) 5-325 MG tablet  Every 4 hours PRN     06/06/17 1355    ibuprofen (ADVIL,MOTRIN) 800 MG tablet  Every 8 hours PRN  06/06/17 1355          This chart was dictated using voice recognition software/Dragon. Despite best efforts to proofread, errors can occur which can change the meaning. Any change was purely unintentional.    Enid DerryWagner, Kaydyn Chism, PA-C 06/06/17 1413    Emily FilbertWilliams, Jonathan E, MD 06/06/17 [id number]

## 2017-06-06 NOTE — ED Notes (Signed)
Knee immobilizer placed on right leg, pulses present. Instructions on crutches given. VSS. NAD. [facility]. Discharge instructions, RX and follow up discussed. All questions answered.

## 2017-06-06 NOTE — ED Triage Notes (Signed)
Pt states she slipped and fell on rug at home this past Saturday, landed on her right knee, states the pain is worse when she walks.

## 2017-07-31 ENCOUNTER — Emergency Department (HOSPITAL_COMMUNITY)
Admission: EM | Admit: 2017-07-31 | Discharge: 2017-07-31 | Disposition: A | Discharge status: Home / Self Care | Payer: Self-pay | Attending: Emergency Medicine | Admitting: Emergency Medicine

## 2017-07-31 ENCOUNTER — Encounter (HOSPITAL_COMMUNITY): Payer: Self-pay | Admitting: Emergency Medicine

## 2017-07-31 ENCOUNTER — Emergency Department (HOSPITAL_COMMUNITY): Payer: Self-pay

## 2017-07-31 DIAGNOSIS — W108XXA Fall (on) (from) other stairs and steps, initial encounter: Secondary | ICD-10-CM | POA: Insufficient documentation

## 2017-07-31 DIAGNOSIS — S80211A Abrasion, right knee, initial encounter: Secondary | ICD-10-CM | POA: Insufficient documentation

## 2017-07-31 DIAGNOSIS — Y999 Unspecified external cause status: Secondary | ICD-10-CM | POA: Insufficient documentation

## 2017-07-31 DIAGNOSIS — Y9389 Activity, other specified: Secondary | ICD-10-CM | POA: Insufficient documentation

## 2017-07-31 DIAGNOSIS — Y9289 Other specified places as the place of occurrence of the external cause: Secondary | ICD-10-CM | POA: Insufficient documentation

## 2017-07-31 DIAGNOSIS — Z79899 Other long term (current) drug therapy: Secondary | ICD-10-CM | POA: Insufficient documentation

## 2017-07-31 DIAGNOSIS — M25561 Pain in right knee: Secondary | ICD-10-CM | POA: Insufficient documentation

## 2017-07-31 DIAGNOSIS — I10 Essential (primary) hypertension: Secondary | ICD-10-CM | POA: Insufficient documentation

## 2017-07-31 DIAGNOSIS — M25562 Pain in left knee: Secondary | ICD-10-CM | POA: Insufficient documentation

## 2017-07-31 DIAGNOSIS — S80212A Abrasion, left knee, initial encounter: Secondary | ICD-10-CM | POA: Insufficient documentation

## 2017-07-31 MED ORDER — OXYCODONE-ACETAMINOPHEN 5-325 MG PO TABS
1.0000 | ORAL_TABLET | Freq: Once | ORAL | Status: AC
  Administered 2017-07-31: 1 via ORAL
  Filled 2017-07-31: qty 1

## 2017-07-31 NOTE — ED Triage Notes (Signed)
Patient here from home with complaints of right knee pain. Fell 6 weeks ago, referred to ortho, unable to go due to not having insurance. Larey Seat again on right knee yesterday.

## 2017-07-31 NOTE — Discharge Instructions (Addendum)
Follow-up with ortho provider listed in this paperwork.

## 2017-07-31 NOTE — ED Provider Notes (Signed)
[facility]-EMERGENCY DEPT Provider Note  CSN: [id number] Arrival date & time: 07/31/17  4098  History   Chief Complaint Chief Complaint  Patient presents with  . Knee Pain  . Knee Injury   HPI Tara Woodward is a 27 y.o. female with a medical history of HTN who presented to the ED for right knee pain following a fall. Patient states that she missed a step when walking out a back door yesterday and she felt forward striking her right knee and heard a pop. She denies head trauma or LOC. Also denies neuro symptoms of dizziness, lightheadedness or paresthesias that would have contributed to fall. Patient states that she has been unable to bear weight since the fall.  Patient states that she injured this same knee in 06/2017 and was told she had a patellar fracture. She was instructed to follow-up with ortho which she never did because of lack of insurance. She was given a knee brace and she states that she got better after that.  Additional history obtained by medical chart. Patient seen at [facility] on 06/06/2017 for right knee pain following an injury. Knee x-ray then showed subluxation of proximal tibia and sclerosis along tibial plateus.    Past Medical History:  Diagnosis Date  . High blood pressure   . Hyperthyroidism   . Morbid obesity with BMI of 45.0-49.9, adult [facility])     Patient Active Problem List   Diagnosis Date Noted  . Severe major depression, single episode, without psychotic features (HCC) 11/08/2016  . HTN (hypertension) 01/24/2013    History reviewed. No pertinent surgical history.   OB History    Gravida  2   Para  2   Term  2   Preterm  0   AB  0   Living  2     SAB  0   TAB  0   Ectopic  0   Multiple  0   Live Births               Home Medications    Prior to Admission medications   Medication Sig Start Date End Date Taking? Authorizing Provider  acetaminophen (TYLENOL) 500 MG tablet Take 1,000 mg by mouth every 6  (six) hours as needed for moderate pain.   Yes [provider]  ibuprofen (ADVIL,MOTRIN) 200 MG tablet Take 800 mg by mouth every 6 (six) hours as needed for moderate pain.   Yes [provider]  levonorgestrel (MIRENA) 20 MCG/24HR IUD 1 each by Intrauterine route once.   Yes [provider]  ibuprofen (ADVIL,MOTRIN) 800 MG tablet Take 1 tablet (800 mg total) by mouth every 8 (eight) hours as needed. Patient not taking: Reported on 07/31/2017 06/06/17   Enid Derry, PA-C  oxyCODONE-acetaminophen (PERCOCET) 5-325 MG tablet Take 1 tablet by mouth every 4 (four) hours as needed for severe pain. Patient not taking: Reported on 07/31/2017 06/06/17 06/06/18  Enid Derry, PA-C    Family History Family History  Problem Relation Age of Onset  . Diabetes Maternal Aunt   . Cancer Maternal Aunt        breast  . Hypertension Maternal Aunt   . Hypertension Maternal Grandmother   . Diabetes Maternal Grandfather   . Cancer Maternal Grandfather        lung ca    Social History Social History   Tobacco Use  . Smoking status: Never Smoker  . Smokeless tobacco: Never Used  Substance Use Topics  .  Alcohol use: No  . Drug use: No     Allergies   Patient has no known allergies.   Review of Systems Review of Systems  Constitutional: Negative.   Endocrine: Negative.   Musculoskeletal: Positive for arthralgias, gait problem and joint swelling.  Skin: Positive for wound.  Neurological: Negative for dizziness, syncope, weakness and numbness.  Hematological: Negative.      Physical Exam Updated Vital Signs BP (!) 165/114 (BP Location: Left Arm)   Pulse 98   Temp 98 F (36.7 C) (Oral)   Resp 18   SpO2 99%   Physical Exam  Constitutional: She is cooperative. No distress.  Obese.  HENT:  Head: Normocephalic and atraumatic.  Eyes: Pupils are equal, round, and reactive to light. Conjunctivae, EOM and lids are normal.  Cardiovascular: Normal rate, regular rhythm,  normal heart sounds, intact distal pulses and normal pulses.  Pulmonary/Chest: Effort normal and breath sounds normal.  Musculoskeletal:       Right knee: She exhibits decreased range of motion and swelling. She exhibits no deformity, no laceration, normal patellar mobility and normal meniscus. Tenderness found.       Left knee: She exhibits effusion. She exhibits normal range of motion. No tenderness found.       Right ankle: Normal.       Left ankle: Normal.  Neurological: She has normal strength. No sensory deficit. She exhibits normal muscle tone.  Skin: Skin is warm and dry. Abrasion noted.     Abrasion on right knee with surrounding erythema and swelling. Minor left knee abrasion.       ED Treatments / Results  Labs (all labs ordered are listed, but only abnormal results are displayed) Labs Reviewed - No data to display  EKG None  Radiology Dg Knee Ap/lat W/sunrise Right  Result Date: 07/31/2017 CLINICAL DATA:  Recent fall with knee pain, initial encounter EXAM: RIGHT KNEE 3 VIEWS COMPARISON:  06/06/2017 FINDINGS: Tricompartmental degenerative changes are noted similar to that seen on the prior exam. Soft tissue swelling is noted over the patella. Small joint effusion is noted. No other focal abnormality is seen. IMPRESSION: Degenerative change similar to that seen on prior exam. No acute bony abnormality is seen. Small joint effusion. Electronically Signed   By: Alcide Clever M.D.   On: 07/31/2017 09:52    Procedures Procedures (including critical care time)  Medications Ordered in ED Medications  oxyCODONE-acetaminophen (PERCOCET/ROXICET) 5-325 MG per tablet 1 tablet (1 tablet Oral Given 07/31/17 0905)    Initial Impression / Assessment and Plan / ED Course  Triage vital signs and the nursing notes have been reviewed.  Pertinent labs & imaging results that were available during care of the patient were reviewed and considered in medical decision making (see chart for  details). Clinical Course as of Jul 31 1098  Tue Jul 31, 2017  1055 Knee x-ray did not show any fractures or dislocations that require additional management. No signs of sclerosis or arthritis seen.   [GM]    Clinical Course User Index [GM] Windy Carina, PA-C   Patient presents 1 day after fall and trauma to her knees. Right knee is more tender, swollen and erythematous than the left. X-ray showed no fractures or dislocations. No systemic s/s or focal signs to suggest infection. Patient is able to bear weight and ambulate despite pain.   Final Clinical Impressions(s) / ED Diagnoses  1. Bilateral Knee Injuries. 2/2 fall. Referral to ortho given. Due to pain and limited  ROM, patient given knee brace and crutches for ambulation and rest. Wound care instructions given for abrasions. No antibiotics indicated at this time.  Dispo: Home. After thorough clinical evaluation, this patient is determined to be medically stable and can be safely discharged with the previously mentioned treatment and/or outpatient follow-up/referral(s). At this time, there are no other apparent medical conditions that require further screening, evaluation or treatment.   Final diagnoses:  Acute pain of both knees    ED Discharge Orders    None        Reva Bores 07/31/17 1101    Mancel Bale, MD 07/31/17 1900

## 2018-01-29 ENCOUNTER — Ambulatory Visit (INDEPENDENT_AMBULATORY_CARE_PROVIDER_SITE_OTHER): Payer: Medicaid Other | Admitting: Obstetrics & Gynecology

## 2018-01-29 ENCOUNTER — Encounter: Payer: Self-pay | Admitting: Obstetrics & Gynecology

## 2018-01-29 ENCOUNTER — Other Ambulatory Visit (HOSPITAL_COMMUNITY)
Admission: RE | Admit: 2018-01-29 | Discharge: 2018-01-29 | Disposition: A | Discharge status: Home / Self Care | Payer: Medicaid Other | Source: Ambulatory Visit | Attending: Obstetrics & Gynecology | Admitting: Obstetrics & Gynecology

## 2018-01-29 VITALS — BP 154/102 | HR 92 | Wt 286.6 lb

## 2018-01-29 DIAGNOSIS — Z30431 Encounter for routine checking of intrauterine contraceptive device: Secondary | ICD-10-CM

## 2018-01-29 DIAGNOSIS — Z01419 Encounter for gynecological examination (general) (routine) without abnormal findings: Secondary | ICD-10-CM | POA: Insufficient documentation

## 2018-01-29 DIAGNOSIS — A630 Anogenital (venereal) warts: Secondary | ICD-10-CM

## 2018-01-29 DIAGNOSIS — Z113 Encounter for screening for infections with a predominantly sexual mode of transmission: Secondary | ICD-10-CM

## 2018-01-29 DIAGNOSIS — Z309 Encounter for contraceptive management, unspecified: Secondary | ICD-10-CM

## 2018-01-29 NOTE — Progress Notes (Addendum)
GYNECOLOGY ANNUAL PREVENTATIVE CARE ENCOUNTER NOTE  Subjective:   Tara Woodward is Woodward 27 y.o. G34P2002 female here for Woodward routine annual gynecologic exam.  Current complaints: none.  Desires annual STI screen. Wants to get IUD removed because "it is expired", does not know what else she will use.  No periods while on IUD.  Denies abnormal vaginal bleeding, discharge, pelvic pain, problems with intercourse or other gynecologic concerns.    Gynecologic History No LMP recorded (lmp unknown). (Menstrual status: IUD).  Contraception: Mirena IUD placed 01/28/2013. Last Pap: 2010. Results were: normal.  Obstetric History OB History  Gravida Para Term Preterm AB Living  2 2 2  0 0 2  SAB TAB Ectopic Multiple Live Births  0 0 0 0      # Outcome Date GA Lbr Len/2nd Weight Sex Delivery Anes PTL Lv  2 Term           1 Term             Past Medical History:  Diagnosis Date  . High blood pressure   . Hyperthyroidism   . Morbid obesity with BMI of 45.0-49.9, adult (HCC)     No past surgical history on file.  Current Outpatient Medications on File Prior to Visit  Medication Sig Dispense Refill  . acetaminophen (TYLENOL) 500 MG tablet Take 1,000 mg by mouth every 6 (six) hours as needed for moderate pain.    Marland Kitchen ibuprofen (ADVIL,MOTRIN) 200 MG tablet Take 800 mg by mouth every 6 (six) hours as needed for moderate pain.    Marland Kitchen ibuprofen (ADVIL,MOTRIN) 800 MG tablet Take 1 tablet (800 mg total) by mouth every 8 (eight) hours as needed. (Patient not taking: Reported on 07/31/2017) 30 tablet 0  . levonorgestrel (MIRENA) 20 MCG/24HR IUD 1 each by Intrauterine route once.    Marland Kitchen oxyCODONE-acetaminophen (PERCOCET) 5-325 MG tablet Take 1 tablet by mouth every 4 (four) hours as needed for severe pain. (Patient not taking: Reported on 07/31/2017) 6 tablet 0   Current Facility-Administered Medications on File Prior to Visit  Medication Dose Route Frequency Provider Last Rate Last Dose  . levonorgestrel  (MIRENA) 20 MCG/24HR IUD 1 each  1 Intra Uterine Device Intrauterine Once Tara Beaulieu A, MD        No Known Allergies  Social History:  reports that she has never smoked. She has never used smokeless tobacco. She reports that she does not drink alcohol or use drugs.  Family History  Problem Relation Age of Onset  . Diabetes Maternal Aunt   . Cancer Maternal Aunt        breast  . Hypertension Maternal Aunt   . Hypertension Maternal Grandmother   . Diabetes Maternal Grandfather   . Cancer Maternal Grandfather        lung ca    The following portions of the patient's history were reviewed and updated as appropriate: allergies, current medications, past family history, past medical history, past social history, past surgical history and problem list.  Review of Systems Pertinent items noted in HPI and remainder of comprehensive ROS otherwise negative.   Objective:  BP (!) 160/112   Pulse 92   Wt 286 lb 9.6 oz (130 kg)   LMP  (LMP Unknown)   BMI 46.26 kg/m  CONSTITUTIONAL: Well-developed, well-nourished female in no acute distress.  HENT:  Normocephalic, atraumatic, External right and left ear normal. Oropharynx is clear and moist EYES: Conjunctivae and EOM are normal. Pupils are equal, round,  and reactive to light. No scleral icterus.  NECK: Normal range of motion, supple, no masses.  Normal thyroid.  SKIN: Skin is warm and dry. No rash noted. Not diaphoretic. No erythema. No pallor. NEUROLOGIC: Alert and oriented to person, place, and time. Normal reflexes, muscle tone coordination. No cranial nerve deficit noted. PSYCHIATRIC: Normal mood and affect. Normal behavior. Normal judgment and thought content. CARDIOVASCULAR: Normal heart rate noted, regular rhythm RESPIRATORY: Clear to auscultation bilaterally. Effort and breath sounds normal, no problems with respiration noted. BREASTS: Symmetric in size. No masses, skin changes, nipple drainage, or lymphadenopathy. ABDOMEN:  Soft, obese, normal bowel sounds, no distention appreciated.  No tenderness, rebound or guarding.  PELVIC: External genitalia with diffusely spread and several non-erythematous genital warts noted on the labia major area and lower part of mons pubis, ranging in size from 5 mm to 1 cm. No drainage. Normal appearing vaginal mucosa and cervix. IUD strings seen.  Normal appearing discharge.  Pap smear obtained.  Unable to palpate uterus or adnexa secondary to habitus.  MUSCULOSKELETAL: Normal range of motion. No tenderness.  No cyanosis, clubbing, or edema.  2+ distal pulses.  Assessment and Plan:  1. Genital warts Talked about genital warts, association with HPV which is sexually transmitted.  Reports her husband is her only partner ever, he was active with other females prior to her. She was told that HPV can lay dormant in the system for several years, then emerge and cause issues.  The strains involved in genital warts are not usually the ones implicated in cervical dysplasia, but will for cervical HPV today. She is asymptomatic, does not want treatment for the warts; information was given to her to review at home.   2. IUD check up Patient was told that she can keep the IUD in place for two more years, no need for removal today unless she really wanted to do it. She agreed with keeping it in for now. She was noted to be really hypertensive, was told that she is on the best contraceptive modality for her given her medical history and habitus.   3. Routine screening for STI (sexually transmitted infection) STI screen ordered, will follow up results and manage accordingly. - HIV Antibody (routine testing w rflx) - Hepatitis C antibody - Hepatitis B surface antigen - RPR - Cytology - PAP( Tara Woodward)  4. Well woman exam - Cytology - PAP( Tara Woodward) Pap done will follow up results and manage accordingly.  Will follow up with PCP for management of her HTN, [facility]  [place] [facility] in [place]. Routine preventative health maintenance measures emphasized. Please refer to After Visit Summary for other counseling recommendations.    Jaynie CollinsUGONNA  Virgilene Stryker, MD, FACOG Obstetrician & Gynecologist, [facility], [facility]

## 2018-01-29 NOTE — Patient Instructions (Addendum)
Please follow up with a general medicine doctor or provider for your elevated blood pressure.  [facility] [address], [place], [place] [address] [phone]  [facility] [address], [place], [place] [address] [phone]  Genital Warts Genital warts are a common STD (sexually transmitted disease). They may appear as small bumps on the tissues of the genital area or anal area. Sometimes, they can become irritated and cause pain. Genital warts are easily passed to other people through sexual contact. Getting treatment is important because genital warts can lead to other problems. In females, the virus that causes genital warts may increase the risk of cervical cancer. What are the causes? Genital warts are caused by a virus that is called human papillomavirus (HPV). HPV is spread by having unprotected sex with an infected person. It can be spread through vaginal, anal, and oral sex. Many people do not know that they are infected. They may be infected for years without problems. However, even if they do not have problems, they can pass the infection to their sexual partners. What increases the risk? Genital warts are more likely to develop in:  People who have unprotected sex.  People who have multiple sexual partners.  People who become sexually active before they are 29 years of age.  Men who are not circumcised.  Women who have a female sexual partner who is not circumcised.  People who have a weakened body defense system (immune system) due to disease or medicine.  People who smoke.  What are the signs or symptoms? Symptoms of genital warts include:  Small growths in the genital area or anal area. These warts often grow in clusters.  Itching and irritation in the genital area or anal area.  Bleeding from the warts.  Painful sexual intercourse.  How is this diagnosed? Genital warts can usually be diagnosed from  their appearance on the vagina, vulva, penis, perineum, anus, or rectum. Tests may also be done, such as:  Biopsy. A tissue sample is removed so it can be looked at under a microscope.  Colposcopy. In females, a magnifying tool is used to examine the vagina and cervix. Certain solutions may be used to make the HPV cells change color so they can be seen more easily.  A Pap test in females.  Tests for other STDs.  How is this treated? Treatment for genital warts may include:  Applying prescription medicines to the warts. These may be solutions or creams.  Freezing the warts with liquid nitrogen (cryotherapy).  Burning the warts with: ? Laser treatment. ? An electrified probe (electrocautery).  Injecting a substance (Candida antigen or Trichophyton antigen) into the warts to help the body's immune system to fight off the warts.  Interferon injections.  Surgery to remove the warts.  Follow these instructions at home: Medicines  Apply over-the-counter and prescription medicines only as told by your health care provider.  Do not treat genital warts with medicines that are used for treating hand warts.  Talk with your health care provider about using over-the-counter anti-itch creams. General instructions  Do not touch or scratch the warts.  Do not have sex until your treatment has been completed.  Tell your current and past sexual partners about your condition because they may also need treatment.  Keep all follow-up visits as told by your health care provider. This is important.  After treatment, use condoms during sex to prevent future infections. Other Instructions  for Women  Women who have genital warts might need increased screening for cervical cancer. This type of cancer is slow growing and can be cured if it is found early. Chances of developing cervical cancer are increased with HPV.  If you become pregnant, tell your health care provider that you have had HPV.  Your health care provider will monitor you closely during pregnancy to be sure that your baby is safe. How is this prevented? Talk with your health care provider about getting the HPV vaccines. These vaccines prevent some HPV infections and cancers. It is recommended that the vaccine be given to males and females who are 63-73 years of age. It will not work if you already have HPV, and it is not recommended for pregnant women. Contact a health care provider if:  You have redness, swelling, or pain in the area of the treated skin.  You have a fever.  You feel generally ill.  You feel lumps in and around your genital area or anal area.  You have bleeding in your genital area or anal area.  You have pain during sexual intercourse. This information is not intended to replace advice given to you by your health care provider. Make sure you discuss any questions you have with your health care provider. Document Released: 02/18/2000 Document Revised: 07/29/2015 Document Reviewed: 05/18/2014 Elsevier Interactive Patient Education  2018 [place].    Human Papillomavirus Human papillomavirus (HPV) is the most common sexually transmitted infection (STI). It easily spreads from person to person (is highly contagious). HPV infections cause genital warts. Certain types of HPV may cause cancers, including cancer of the lower part of the uterus (cervix), vagina, outer female genital area (vulva), penis, anus, and rectum. HPV may also cause cancers of the oral cavity, such as the throat, tongue, and tonsils. There are many types of HPV. It usually does not cause symptoms. However, sometimes there are wart-like lesions in the throat or warts in the genital area that you can see or feel. It is possible to be infected for long periods and pass HPV to others without knowing it. What are the causes? HPV is caused by a virus that spreads from person to person through sexual contact. This includes oral,  vaginal, or anal sex. What increases the risk? The following factors may make you more likely to develop this condition:  Having unprotected oral, vaginal, or anal sex.  Having several sex partners.  Having a sex partner who has other sex partners.  Having or having had another STI.  Having a weak disease-fighting (immune) system.  Having damaged skin in the genital area.  What are the signs or symptoms? Most people who have HPV do not have any symptoms. If symptoms are present, they may include:  Wartlike lesions in the throat (from having oral sex).  Warts on the infected skin or mucous membranes.  Genital warts that may itch, burn, bleed, or be painful during sexual intercourse.  How is this diagnosed? If wartlike lesions are present in the throat or if genital warts are present, your health care provider can usually diagnose HPV with a physical exam. Genital warts are easily seen. In females, tests may be used to diagnose HPV, including:  A Pap test. A Pap test takes a sample of cells from your cervix to check for cancer and HPV infection.  An HPV test. This is similar to a Pap test and involves taking a sample of cells from your cervix.  Using a  scope to view the cervix (colposcopy). This may be done if a pelvic exam or Pap test is abnormal. A sample of tissue may be removed for testing (biopsy) during the colposcopy.  Currently, there is no test to detect HPV in males. How is this treated? There is no treatment for the virus itself. However, there are treatments for the health problems and symptoms HPV can cause. Your health care provider will monitor you closely after you are treated as HPV can come back and may need treatment again. Treatment for HPV may include:  Medicines, which may be injected or applied to genital warts in a cream, lotion, liquid or gel form.  Use of a probe to apply extreme cold (cryotherapy) to the genital warts.  Application of an intense beam  of light (laser treatment) on the genital warts.  Use of a probe to apply extreme heat (electrocautery) on the genital warts.  Surgery to remove the genital warts.  Follow these instructions at home: Medicines  Take over-the-counter and prescription medicines only as told by your health care provider. This include creams for itching or irritation.  Do not treat genital warts with medicines used for treating hand warts. General instructions  Do not touch or scratch the warts.  Do not have sex while you are being treated.  Do not douche or use tampons during treatment (women).  Tell your sex partner about your infection. He or she may also need to be treated.  If you become pregnant, tell your health care provider that you have HPV. Your health care provider will monitor you closely during pregnancy to make sure your baby is safe.  Keep all follow-up visits as told by your health care provider. This is important. How is this prevented?  Talk with your health care provider about getting the HPV vaccines. These vaccines prevent some HPV infections and cancers. The vaccines are recommended for males and females between the ages of 65 and 71. They will not work if you already have HPV, and they are not recommended for pregnant women.  After treatment, use condoms during sex to prevent future infections.  Have only one sex partner.  Have a sex partner who does not have other sex partners.  Get regular Pap tests as directed by your health care provider. Contact a health care provider if:  The treated skin becomes red, swollen, or painful.  You have a fever.  You feel generally ill.  You feel lumps or pimples sticking out in and around your genital area.  You develop bleeding of the vagina or the treatment area.  You have painful sexual intercourse. Summary  Human papillomavirus (HPV) is the most common sexually transmitted infection (STI) and is highly contagious.  Most  people carrying HPV do not have any symptoms.  HPV can be prevented with vaccination. The vaccine is recommended for males and females between the ages of 22 and 42.  There is no treatment for the virus itself. However, there are treatments for the health problems and symptoms HPV can cause. This information is not intended to replace advice given to you by your health care provider. Make sure you discuss any questions you have with your health care provider. Document Released: 05/13/2003 Document Revised: 01/30/2016 Document Reviewed: 01/30/2016 Elsevier Interactive Patient Education  2018 [place] 18-39 Years, Female Preventive care refers to lifestyle choices and visits with your health care provider that can promote health and wellness. What does preventive care include?  A yearly physical exam. This is also called an annual well check.  Dental exams once or twice a year.  Routine eye exams. Ask your health care provider how often you should have your eyes checked.  Personal lifestyle choices, including: ? Daily care of your teeth and gums. ? Regular physical activity. ? Eating a healthy diet. ? Avoiding tobacco and drug use. ? Limiting alcohol use. ? Practicing safe sex. ? Taking vitamin and mineral supplements as recommended by your health care provider. What happens during an annual well check? The services and screenings done by your health care provider during your annual well check will depend on your age, overall health, lifestyle risk factors, and family history of disease. Counseling Your health care provider may ask you questions about your:  Alcohol use.  Tobacco use.  Drug use.  Emotional well-being.  Home and relationship well-being.  Sexual activity.  Eating habits.  Work and work Statistician.  Method of birth control.  Menstrual cycle.  Pregnancy history.  Screening You may have the following tests or  measurements:  Height, weight, and BMI.  Diabetes screening. This is done by checking your blood sugar (glucose) after you have not eaten for a while (fasting).  Blood pressure.  Lipid and cholesterol levels. These may be checked every 5 years starting at age 59.  Skin check.  Hepatitis C blood test.  Hepatitis B blood test.  Sexually transmitted disease (STD) testing.  BRCA-related cancer screening. This may be done if you have a family history of breast, ovarian, tubal, or peritoneal cancers.  Pelvic exam and Pap test. This may be done every 3 years starting at age 75. Starting at age 65, this may be done every 5 years if you have a Pap test in combination with an HPV test.  Discuss your test results, treatment options, and if necessary, the need for more tests with your health care provider. Vaccines Your health care provider may recommend certain vaccines, such as:  Influenza vaccine. This is recommended every year.  Tetanus, diphtheria, and acellular pertussis (Tdap, Td) vaccine. You may need a Td booster every 10 years.  Varicella vaccine. You may need this if you have not been vaccinated.  HPV vaccine. If you are 60 or younger, you may need three doses over 6 months.  Measles, mumps, and rubella (MMR) vaccine. You may need at least one dose of MMR. You may also need a second dose.  Pneumococcal 13-valent conjugate (PCV13) vaccine. You may need this if you have certain conditions and were not previously vaccinated.  Pneumococcal polysaccharide (PPSV23) vaccine. You may need one or two doses if you smoke cigarettes or if you have certain conditions.  Meningococcal vaccine. One dose is recommended if you are age 65-21 years and a first-year college student living in a residence hall, or if you have one of several medical conditions. You may also need additional booster doses.  Hepatitis A vaccine. You may need this if you have certain conditions or if you travel or work  in places where you may be exposed to hepatitis A.  Hepatitis B vaccine. You may need this if you have certain conditions or if you travel or work in places where you may be exposed to hepatitis B.  Haemophilus influenzae type b (Hib) vaccine. You may need this if you have certain risk factors.  Talk to your health care provider about which screenings and vaccines you need and how often you need them. This information is not  intended to replace advice given to you by your health care provider. Make sure you discuss any questions you have with your health care provider. Document Released: 04/18/2001 Document Revised: 11/10/2015 Document Reviewed: 12/22/2014 Elsevier Interactive Patient Education  [employer].

## 2018-01-30 LAB — CYTOLOGY - PAP
CHLAMYDIA, DNA PROBE: NEGATIVE
DIAGNOSIS: NEGATIVE
HPV (WINDOPATH): NOT DETECTED
NEISSERIA GONORRHEA: NEGATIVE

## 2018-01-31 LAB — HEPATITIS B SURFACE ANTIGEN: HEP B S AG: NEGATIVE

## 2018-01-31 LAB — RPR, QUANT+TP ABS (REFLEX)
Rapid Plasma Reagin, Quant: 1:1 {titer} — ABNORMAL HIGH
TREPONEMA PALLIDUM AB: NONREACTIVE

## 2018-01-31 LAB — RPR: RPR Ser Ql: REACTIVE — AB

## 2018-01-31 LAB — HEPATITIS C ANTIBODY: Hep C Virus Ab: <0.1 s/co ratio (ref 0.0–0.9)

## 2018-01-31 LAB — HIV ANTIBODY (ROUTINE TESTING W REFLEX): HIV Screen 4th Generation wRfx: NONREACTIVE

## 2018-12-20 ENCOUNTER — Encounter: Payer: Self-pay | Admitting: Radiology

## 2019-12-08 ENCOUNTER — Emergency Department
Admission: EM | Admit: 2019-12-08 | Discharge: 2019-12-08 | Discharge status: Against Medical Advice | Payer: Medicaid Other

## 2019-12-09 ENCOUNTER — Encounter: Payer: Self-pay | Admitting: Emergency Medicine

## 2019-12-09 ENCOUNTER — Emergency Department
Admission: EM | Admit: 2019-12-09 | Discharge: 2019-12-09 | Disposition: A | Discharge status: Home / Self Care | Payer: Medicaid Other | Attending: Emergency Medicine | Admitting: Emergency Medicine

## 2019-12-09 ENCOUNTER — Other Ambulatory Visit: Payer: Self-pay

## 2019-12-09 DIAGNOSIS — E039 Hypothyroidism, unspecified: Secondary | ICD-10-CM | POA: Insufficient documentation

## 2019-12-09 DIAGNOSIS — R45851 Suicidal ideations: Secondary | ICD-10-CM | POA: Insufficient documentation

## 2019-12-09 DIAGNOSIS — F332 Major depressive disorder, recurrent severe without psychotic features: Secondary | ICD-10-CM | POA: Insufficient documentation

## 2019-12-09 DIAGNOSIS — I10 Essential (primary) hypertension: Secondary | ICD-10-CM | POA: Insufficient documentation

## 2019-12-09 LAB — URINE DRUG SCREEN, QUALITATIVE (ARMC ONLY)
Amphetamines, Ur Screen: NOT DETECTED
Barbiturates, Ur Screen: NOT DETECTED
Benzodiazepine, Ur Scrn: NOT DETECTED
Cannabinoid 50 Ng, Ur ~~LOC~~: NOT DETECTED
Cocaine Metabolite,Ur ~~LOC~~: NOT DETECTED
MDMA (Ecstasy)Ur Screen: NOT DETECTED
Methadone Scn, Ur: NOT DETECTED
Opiate, Ur Screen: NOT DETECTED
Phencyclidine (PCP) Ur S: NOT DETECTED
Tricyclic, Ur Screen: NOT DETECTED

## 2019-12-09 LAB — COMPREHENSIVE METABOLIC PANEL
ALT: 8 U/L (ref 0–44)
AST: 16 U/L (ref 15–41)
Albumin: 4.5 g/dL (ref 3.5–5.0)
Alkaline Phosphatase: 50 U/L (ref 38–126)
Anion gap: 11 (ref 5–15)
BUN: 12 mg/dL (ref 6–20)
CO2: 24 mmol/L (ref 22–32)
Calcium: 8.9 mg/dL (ref 8.9–10.3)
Chloride: 101 mmol/L (ref 98–111)
Creatinine, Ser: 0.56 mg/dL (ref 0.44–1.00)
GFR calc Af Amer: >60 mL/min (ref >60)
GFR calc non Af Amer: >60 mL/min (ref >60)
Glucose, Bld: 91 mg/dL (ref 70–99)
Potassium: 3.1 mmol/L — ABNORMAL LOW (ref 3.5–5.1)
Sodium: 136 mmol/L (ref 135–145)
Total Bilirubin: 0.9 mg/dL (ref 0.3–1.2)
Total Protein: 8 g/dL (ref 6.5–8.1)

## 2019-12-09 LAB — CBC
HCT: 37.6 % (ref 36.0–46.0)
Hemoglobin: 13.8 g/dL (ref 12.0–15.0)
MCH: 32 pg (ref 26.0–34.0)
MCHC: 36.7 g/dL — ABNORMAL HIGH (ref 30.0–36.0)
MCV: 87.2 fL (ref 80.0–100.0)
Platelets: 373 10*3/uL (ref 150–400)
RBC: 4.31 MIL/uL (ref 3.87–5.11)
RDW: 13.4 % (ref 11.5–15.5)
WBC: 6.2 10*3/uL (ref 4.0–10.5)
nRBC: 0 % (ref 0.0–0.2)

## 2019-12-09 LAB — URINALYSIS, COMPLETE (UACMP) WITH MICROSCOPIC
Bilirubin Urine: NEGATIVE
Glucose, UA: NEGATIVE mg/dL
Hgb urine dipstick: NEGATIVE
Ketones, ur: NEGATIVE mg/dL
Nitrite: NEGATIVE
Protein, ur: NEGATIVE mg/dL
Specific Gravity, Urine: 1.03 (ref 1.005–1.030)
pH: 5 (ref 5.0–8.0)

## 2019-12-09 LAB — ACETAMINOPHEN LEVEL: Acetaminophen (Tylenol), Serum: <10 ug/mL — ABNORMAL LOW (ref 10–30)

## 2019-12-09 LAB — SALICYLATE LEVEL: Salicylate Lvl: <7 mg/dL — ABNORMAL LOW (ref 7.0–30.0)

## 2019-12-09 LAB — ETHANOL: Alcohol, Ethyl (B): <10 mg/dL (ref <10)

## 2019-12-09 LAB — POCT PREGNANCY, URINE: Preg Test, Ur: NEGATIVE

## 2019-12-09 MED ORDER — DULOXETINE HCL 20 MG PO CPEP
20.0000 mg | ORAL_CAPSULE | Freq: Every day | ORAL | Status: DC
  Administered 2019-12-09: 20 mg via ORAL
  Filled 2019-12-09 (×2): qty 1

## 2019-12-09 MED ORDER — QUETIAPINE FUMARATE 25 MG PO TABS: 25.0000 mg | ORAL_TABLET | Freq: Every day | ORAL | 0 refills | Status: DC

## 2019-12-09 MED ORDER — QUETIAPINE FUMARATE 25 MG PO TABS: 12.5000 mg | ORAL_TABLET | Freq: Every day | ORAL | Status: DC

## 2019-12-09 MED ORDER — DULOXETINE HCL 20 MG PO CPEP: 20.0000 mg | ORAL_CAPSULE | Freq: Every day | ORAL | 0 refills | Status: DC

## 2019-12-09 MED ORDER — CLONAZEPAM 0.5 MG PO TABS: 0.5000 mg | ORAL_TABLET | Freq: Two times a day (BID) | ORAL | 0 refills | Status: DC

## 2019-12-09 MED ORDER — CLONAZEPAM 0.5 MG PO TABS
0.5000 mg | ORAL_TABLET | Freq: Two times a day (BID) | ORAL | Status: DC
  Administered 2019-12-09: 0.5 mg via ORAL
  Filled 2019-12-09: qty 1

## 2019-12-09 MED ORDER — POTASSIUM CHLORIDE CRYS ER 20 MEQ PO TBCR
40.0000 meq | EXTENDED_RELEASE_TABLET | Freq: Once | ORAL | Status: AC
  Administered 2019-12-09: 40 meq via ORAL
  Filled 2019-12-09: qty 2

## 2019-12-09 NOTE — ED Notes (Signed)
Pt. Transferred from Triage to room 24H after dressing out and screening for contraband. Report to include Situation, Background, Assessment and Recommendations from RN. Pt. Oriented to Quad including Q15 minute rounds as well as Psychologist, counselling for their protection. Patient is alert and oriented, warm and dry in no acute distress. Patient denies HI and AVH. Pt. Encouraged to let this nurse know if needs arise.

## 2019-12-09 NOTE — ED Provider Notes (Signed)
----------------------------------------- °  1:25 PM on 12/09/2019 -----------------------------------------  Patient has been seen and evaluated by psychiatry.  They believe the patient safe for discharge home from a psychiatric standpoint.  From a medical standpoint patient's work-up is been largely nonrevealing.  I will discharge home with medications as recommended by psychiatry with outpatient follow-up.   Minna Antis, MD 12/09/19 1325

## 2019-12-09 NOTE — Discharge Instructions (Addendum)
You have been seen in the emergency department for a  psychiatric concern. You have been evaluated both medically as well as psychiatrically. Please follow-up with your outpatient resources provided. Return to the emergency department for any worsening symptoms, or any thoughts of hurting yourself or anyone else so that we may attempt to help you. 
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
## 2019-12-09 NOTE — ED Triage Notes (Signed)
Patient ambulatory to triage with steady gait, without difficulty or distress noted; pt reports here for depression; pt st thoughts of hurting self with no specific plan or hx of same

## 2019-12-09 NOTE — ED Provider Notes (Signed)
[facility] Emergency Department Provider Note  ____________________________________________  Time seen: Approximately 6:16 AM  I have reviewed the triage vital signs and the nursing notes.   HISTORY  Chief Complaint Mental Health Problem   HPI Tara Woodward is a 29 y.o. female a history of hypothyroidism, depression, hypertension who presents for evaluation of depression.  Patient is here voluntarily.  Reports that she has been having marital problems for the last 2 months and has been very depressed.  She has been having passive suicidal thoughts with no plan.  No prior history of suicide attempt.  Patient was seen once in the past for psych at an ED but never been admitted.  She does not take any medications.  Denies any drugs or alcohol.  She does have 31 year old twins at home and she says "this is the only reason I am still around."   Past Medical History:  Diagnosis Date  . High blood pressure   . Hyperthyroidism   . Morbid obesity with BMI of 45.0-49.9, adult [facility])     Patient Active Problem List   Diagnosis Date Noted  . Severe major depression, single episode, without psychotic features (HCC) 11/08/2016  . HTN (hypertension) 01/24/2013    History reviewed. No pertinent surgical history.  Prior to Admission medications   Medication Sig Start Date End Date Taking? Authorizing Provider  acetaminophen (TYLENOL) 500 MG tablet Take 1,000 mg by mouth every 6 (six) hours as needed for moderate pain.    [provider]  ibuprofen (ADVIL,MOTRIN) 200 MG tablet Take 800 mg by mouth every 6 (six) hours as needed for moderate pain.    [provider]  ibuprofen (ADVIL,MOTRIN) 800 MG tablet Take 1 tablet (800 mg total) by mouth every 8 (eight) hours as needed. Patient not taking: Reported on 07/31/2017 06/06/17   Enid Derry, PA-C  levonorgestrel (MIRENA) 20 MCG/24HR IUD 1 each by Intrauterine route once.    [provider]     Allergies Patient has no known allergies.  Family History  Problem Relation Age of Onset  . Diabetes Maternal Aunt   . Cancer Maternal Aunt        breast  . Hypertension Maternal Aunt   . Hypertension Maternal Grandmother   . Diabetes Maternal Grandfather   . Cancer Maternal Grandfather        lung ca    Social History Social History   Tobacco Use  . Smoking status: Never Smoker  . Smokeless tobacco: Never Used  Substance Use Topics  . Alcohol use: No  . Drug use: No    Review of Systems  Constitutional: Negative for fever. Eyes: Negative for visual changes. ENT: Negative for sore throat. Neck: No neck pain  Cardiovascular: Negative for chest pain. Respiratory: Negative for shortness of breath. Gastrointestinal: Negative for abdominal pain, vomiting or diarrhea. Genitourinary: Negative for dysuria. Musculoskeletal: Negative for back pain. Skin: Negative for rash. Neurological: Negative for headaches, weakness or numbness. Psych: + depression, and SI. No HI  ____________________________________________   PHYSICAL EXAM:  VITAL SIGNS: ED Triage Vitals  Enc Vitals Group     BP 12/09/19 0545 (!) 157/109     Pulse Rate 12/09/19 0545 84     Resp 12/09/19 0545 20     Temp 12/09/19 0545 98.4 F (36.9 C)     Temp Source 12/09/19 0545 Oral     SpO2 12/09/19 0545 100 %     Weight 12/09/19 0545 260 lb (117.9 kg)  Height 12/09/19 0545 5\' 5"  (1.651 m)     Head Circumference --      Peak Flow --      Pain Score 12/09/19 0552 0     Pain Loc --      Pain Edu? --      Excl. in GC? --     Constitutional: Alert and oriented. Well appearing and in no apparent distress. HEENT:      Head: Normocephalic and atraumatic.         Eyes: Conjunctivae are normal. Sclera is non-icteric.       Mouth/Throat: Mucous membranes are moist.       Neck: Supple with no signs of meningismus. Cardiovascular: Regular rate and rhythm.  Respiratory: Normal respiratory effort.   Gastrointestinal: Soft, non tender, and non distended. Musculoskeletal: No edema, cyanosis, or erythema of extremities. Neurologic: Normal speech and language. Face is symmetric. Moving all extremities. No gross focal neurologic deficits are appreciated. Skin: Skin is warm, dry and intact. No rash noted. Psychiatric: Mood and affect are depressed. Speech and behavior are normal.  ____________________________________________   LABS (all labs ordered are listed, but only abnormal results are displayed)  Labs Reviewed  CBC - Abnormal; Notable for the following components:      Result Value   MCHC 36.7 (*)    All other components within normal limits  COMPREHENSIVE METABOLIC PANEL  ETHANOL  SALICYLATE LEVEL  ACETAMINOPHEN LEVEL  URINE DRUG SCREEN, QUALITATIVE ([facility] ONLY)  URINALYSIS, COMPLETE (UACMP) WITH MICROSCOPIC  POCT PREGNANCY, URINE   ____________________________________________  EKG  none  ____________________________________________  RADIOLOGY  none  ____________________________________________   PROCEDURES  Procedure(s) performed: None Procedures Critical Care performed:  None ____________________________________________   INITIAL IMPRESSION / ASSESSMENT AND PLAN / ED COURSE  29 y.o. female a history of hypothyroidism, depression, hypertension who presents for evaluation of depression and passive SI.  Patient is here voluntarily requesting treatment.  We will keep patient voluntary.  Will consult psychiatry.  Old medical records reviewed.  The patient has been placed in psychiatric observation due to the need to provide a safe environment for the patient while obtaining psychiatric consultation and evaluation, as well as ongoing medical and medication management to treat the patient's condition.  The patient has been placed under full IVC at this time.       Please note:  Patient was evaluated in Emergency Department today for the symptoms described in  the history of present illness. Patient was evaluated in the context of the global COVID-19 pandemic, which necessitated consideration that the patient might be at risk for infection with the SARS-CoV-2 virus that causes COVID-19. Institutional protocols and algorithms that pertain to the evaluation of patients at risk for COVID-19 are in a state of rapid change based on information released by regulatory bodies including the CDC and federal and state organizations. These policies and algorithms were followed during the patient's care in the ED.  Some ED evaluations and interventions may be delayed as a result of limited staffing during the pandemic.   ____________________________________________   FINAL CLINICAL IMPRESSION(S) / ED DIAGNOSES   Final diagnoses:  Severe episode of recurrent major depressive disorder, without psychotic features (HCC)      NEW MEDICATIONS STARTED DURING THIS VISIT:  ED Discharge Orders    None       Note:  This document was prepared using Dragon voice recognition software and may include unintentional dictation errors.    38, MD 12/09/19 [phone]

## 2019-12-09 NOTE — ED Notes (Signed)
With ED tech present and this nurse, Pt removes grey t-shirt, blue shorts, white panties, white flip flops, charger cord and samsung cell phone--all placed in labeled pt belonging bag to be secured on nursing unit & pt changed into behav scrubs
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
## 2020-02-28 ENCOUNTER — Other Ambulatory Visit: Payer: Self-pay

## 2020-02-28 ENCOUNTER — Emergency Department: Payer: HRSA Program

## 2020-02-28 ENCOUNTER — Emergency Department
Admission: EM | Admit: 2020-02-28 | Discharge: 2020-02-28 | Disposition: A | Discharge status: Home / Self Care | Payer: HRSA Program | Attending: Emergency Medicine | Admitting: Emergency Medicine

## 2020-02-28 ENCOUNTER — Encounter: Payer: Self-pay | Admitting: Emergency Medicine

## 2020-02-28 DIAGNOSIS — J029 Acute pharyngitis, unspecified: Secondary | ICD-10-CM | POA: Diagnosis present

## 2020-02-28 DIAGNOSIS — T391X1A Poisoning by 4-Aminophenol derivatives, accidental (unintentional), initial encounter: Secondary | ICD-10-CM | POA: Insufficient documentation

## 2020-02-28 DIAGNOSIS — I10 Essential (primary) hypertension: Secondary | ICD-10-CM | POA: Insufficient documentation

## 2020-02-28 DIAGNOSIS — B342 Coronavirus infection, unspecified: Secondary | ICD-10-CM

## 2020-02-28 DIAGNOSIS — U071 COVID-19: Secondary | ICD-10-CM | POA: Insufficient documentation

## 2020-02-28 LAB — CBC WITH DIFFERENTIAL/PLATELET
Abs Immature Granulocytes: 0.04 10*3/uL (ref 0.00–0.07)
Basophils Absolute: 0 10*3/uL (ref 0.0–0.1)
Basophils Relative: 0 %
Eosinophils Absolute: 0 10*3/uL (ref 0.0–0.5)
Eosinophils Relative: 0 %
HCT: 36.8 % (ref 36.0–46.0)
Hemoglobin: 12.5 g/dL (ref 12.0–15.0)
Immature Granulocytes: 0 %
Lymphocytes Relative: 18 %
Lymphs Abs: 1.8 10*3/uL (ref 0.7–4.0)
MCH: 30.8 pg (ref 26.0–34.0)
MCHC: 34 g/dL (ref 30.0–36.0)
MCV: 90.6 fL (ref 80.0–100.0)
Monocytes Absolute: 0.7 10*3/uL (ref 0.1–1.0)
Monocytes Relative: 7 %
Neutro Abs: 7.2 10*3/uL (ref 1.7–7.7)
Neutrophils Relative %: 75 %
Platelets: 356 10*3/uL (ref 150–400)
RBC: 4.06 MIL/uL (ref 3.87–5.11)
RDW: 12.9 % (ref 11.5–15.5)
WBC: 9.8 10*3/uL (ref 4.0–10.5)
nRBC: 0 % (ref 0.0–0.2)

## 2020-02-28 LAB — COMPREHENSIVE METABOLIC PANEL
ALT: 12 U/L (ref 0–44)
AST: 19 U/L (ref 15–41)
Albumin: 4.2 g/dL (ref 3.5–5.0)
Alkaline Phosphatase: 54 U/L (ref 38–126)
Anion gap: 11 (ref 5–15)
BUN: 14 mg/dL (ref 6–20)
CO2: 23 mmol/L (ref 22–32)
Calcium: 8.9 mg/dL (ref 8.9–10.3)
Chloride: 104 mmol/L (ref 98–111)
Creatinine, Ser: 0.6 mg/dL (ref 0.44–1.00)
GFR, Estimated: >60 mL/min (ref >60)
Glucose, Bld: 101 mg/dL — ABNORMAL HIGH (ref 70–99)
Potassium: 3.5 mmol/L (ref 3.5–5.1)
Sodium: 138 mmol/L (ref 135–145)
Total Bilirubin: 0.5 mg/dL (ref 0.3–1.2)
Total Protein: 7.6 g/dL (ref 6.5–8.1)

## 2020-02-28 LAB — ACETAMINOPHEN LEVEL
Acetaminophen (Tylenol), Serum: 80 ug/mL — ABNORMAL HIGH (ref 10–30)
Acetaminophen (Tylenol), Serum: 45 ug/mL — ABNORMAL HIGH (ref 10–30)

## 2020-02-28 LAB — RESP PANEL BY RT-PCR (FLU A&B, COVID) ARPGX2
Influenza A by PCR: NEGATIVE
Influenza B by PCR: NEGATIVE
SARS Coronavirus 2 by RT PCR: POSITIVE — AB

## 2020-02-28 NOTE — ED Provider Notes (Signed)
[facility] Emergency Department Provider Note   ____________________________________________   Event Date/Time   First MD Initiated Contact with Patient 02/28/20 1847     (approximate)  I have reviewed the triage vital signs and the nursing notes.   HISTORY  Chief Complaint Headache, Sore Throat, and Covid Exposure    HPI Tara Woodward is a 29 y.o. female with stated past medical history of high blood pressure and morbid obesity who presents for headache, sore throat, cough, and recent Covid exposure in her daughter.  Patient states that the symptoms been worsening over the last 2 days.  Patient endorses associated subjective fever.  Patient currently denies any vision changes, tinnitus, difficulty speaking, facial droop, shortness of breath, abdominal pain, nausea/vomiting/diarrhea, dysuria, or weakness/numbness/paresthesias in any extremity         Past Medical History:  Diagnosis Date  . High blood pressure   . Hyperthyroidism   . Morbid obesity with BMI of 45.0-49.9, adult [facility])     Patient Active Problem List   Diagnosis Date Noted  . Severe major depression, single episode, without psychotic features (HCC) 11/08/2016  . HTN (hypertension) 01/24/2013    History reviewed. No pertinent surgical history.  Prior to Admission medications   Medication Sig Start Date End Date Taking? Authorizing Provider  acetaminophen (TYLENOL) 500 MG tablet Take 1,000 mg by mouth every 6 (six) hours as needed for moderate pain.    [provider]  clonazePAM (KLONOPIN) 0.5 MG tablet Take 1 tablet (0.5 mg total) by mouth 2 (two) times daily for 7 days. 12/09/19 12/16/19  Minna Antis, MD  DULoxetine (CYMBALTA) 20 MG capsule Take 1 capsule (20 mg total) by mouth daily for 7 days. 12/09/19 12/16/19  Minna Antis, MD  ibuprofen (ADVIL,MOTRIN) 200 MG tablet Take 800 mg by mouth every 6 (six) hours as needed for moderate pain.    [provider]  ibuprofen (ADVIL,MOTRIN) 800 MG tablet Take 1 tablet (800 mg total) by mouth every 8 (eight) hours as needed. Patient not taking: Reported on 07/31/2017 06/06/17   Enid Derry, PA-C  levonorgestrel (MIRENA) 20 MCG/24HR IUD 1 each by Intrauterine route once.    [provider]  QUEtiapine (SEROQUEL) 25 MG tablet Take 1 tablet (25 mg total) by mouth at bedtime. 12/09/19   Minna Antis, MD    Allergies Patient has no known allergies.  Family History  Problem Relation Age of Onset  . Diabetes Maternal Aunt   . Cancer Maternal Aunt        breast  . Hypertension Maternal Aunt   . Hypertension Maternal Grandmother   . Diabetes Maternal Grandfather   . Cancer Maternal Grandfather        lung ca    Social History Social History   Tobacco Use  . Smoking status: Never Smoker  . Smokeless tobacco: Never Used  Substance Use Topics  . Alcohol use: No  . Drug use: No    Review of Systems Constitutional: Endorses fever/chills Eyes: No visual changes. ENT: Endorses sore throat. Cardiovascular: Denies chest pain. Respiratory: Denies shortness of breath. Gastrointestinal: No abdominal pain.  No nausea, no vomiting.  No diarrhea. Genitourinary: Negative for dysuria. Musculoskeletal: Negative for acute arthralgias Skin: Negative for rash. Neurological: Endorses generalized weakness.  Negative for headaches, numbness/paresthesias in any extremity Psychiatric: Negative for suicidal ideation/homicidal ideation   ____________________________________________   PHYSICAL EXAM:  VITAL SIGNS: ED Triage Vitals  Enc Vitals Group     BP 02/28/20 1842 Marland Kitchen)  170/117     Pulse Rate 02/28/20 1842 91     Resp 02/28/20 1842 16     Temp 02/28/20 1842 98.4 F (36.9 C)     Temp Source 02/28/20 1842 Oral     SpO2 02/28/20 1842 97 %     Weight 02/28/20 1843 257 lb 15 oz (117 kg)     Height 02/28/20 1843 5\' 5"  (1.651 m)     Head Circumference --      Peak Flow --       Pain Score 02/28/20 1843 9     Pain Loc --      Pain Edu? --      Excl. in GC? --    Constitutional: Alert and oriented. Well appearing and in no acute distress. Eyes: Conjunctivae are normal. PERRL. Head: Atraumatic. Nose: No congestion/rhinnorhea. Mouth/Throat: Mucous membranes are moist. Neck: No stridor Cardiovascular: Grossly normal heart sounds.  Good peripheral circulation. Respiratory: Normal respiratory effort.  No retractions. Gastrointestinal: Soft and nontender. No distention. Musculoskeletal: No obvious deformities Neurologic:  Normal speech and language. No gross focal neurologic deficits are appreciated. Skin:  Skin is warm and dry. No rash noted. Psychiatric: Mood and affect are normal. Speech and behavior are normal.  ____________________________________________   LABS (all labs ordered are listed, but only abnormal results are displayed)  Labs Reviewed  RESP PANEL BY RT-PCR (FLU A&B, COVID) ARPGX2 - Abnormal; Notable for the following components:      Result Value   SARS Coronavirus 2 by RT PCR POSITIVE (*)    All other components within normal limits  ACETAMINOPHEN LEVEL - Abnormal; Notable for the following components:   Acetaminophen (Tylenol), Serum 80 (*)    All other components within normal limits  COMPREHENSIVE METABOLIC PANEL - Abnormal; Notable for the following components:   Glucose, Bld 101 (*)    All other components within normal limits  ACETAMINOPHEN LEVEL - Abnormal; Notable for the following components:   Acetaminophen (Tylenol), Serum 45 (*)    All other components within normal limits  CBC WITH DIFFERENTIAL/PLATELET   ____________________________________________  EKG  ED ECG REPORT I, 03/01/20, the attending physician, personally viewed and interpreted this ECG.  Date: 02/28/2020 EKG Time: 1854 Rate: 90 Rhythm: normal sinus rhythm QRS Axis: normal Intervals: normal ST/T Wave abnormalities: normal Narrative  Interpretation: no evidence of acute ischemia  ____________________________________________  RADIOLOGY  ED MD interpretation: Single view portable x-ray of the chest shows low lung volumes but without any evidence of acute pneumonia, pneumothorax, or widened mediastinum  Official radiology report(s): DG Chest Port 1 View  Result Date: 02/28/2020 CLINICAL DATA:  Cough.  COVID-19 exposure. EXAM: PORTABLE CHEST 1 VIEW COMPARISON:  None. FINDINGS: Apical lordotic positioning. Midline trachea. Normal heart size for level of inspiration. No pleural effusion or pneumothorax. Low lung volumes. Clear lungs. IMPRESSION: Low lung volumes, without acute disease. Electronically Signed   By: 03/01/2020 M.D.   On: 02/28/2020 19:10    ____________________________________________   PROCEDURES  Procedure(s) performed (including Critical Care):  Procedures   ____________________________________________   INITIAL IMPRESSION / ASSESSMENT AND PLAN / ED COURSE  As part of my medical decision making, I reviewed the following data within the electronic MEDICAL RECORD NUMBER Nursing notes reviewed and incorporated, Labs reviewed, EKG interpreted, Old chart reviewed, Radiograph reviewed and Notes from prior ED visits reviewed and incorporated        Presentation most consistent with Viral Syndrome.  Patient has tested positive for COVID-19.  Based on vitals and exam they are nontoxic and stable for discharge.  Given History and Exam I have a lower suspicion for: Emergent CardioPulmonary causes [such as Acute Asthma or COPD Exacerbation, acute Heart Failure or exacerbation, PE, PTX, atypical ACS, PNA]. Emergent Otolaryngeal causes [such as PTA, RPA, Ludwigs, Epiglottitis, EBV].  Regarding Emergent Travel or Immunosuppressive related infectious: I have a low suspicion for acute HIV. Patient's Tylenol also down trended from 80-45 at the 4-hour mark and therefore does not need acute N-acetylcysteine  treatment Will provide strict return precautions and instructions on self-isolation/quarantine and anticipatory guidance.      ____________________________________________   FINAL CLINICAL IMPRESSION(S) / ED DIAGNOSES  Final diagnoses:  Coronavirus infection  Tylenol overdose, accidental or unintentional, initial encounter     ED Discharge Orders    None       Note:  This document was prepared using Dragon voice recognition software and may include unintentional dictation errors.   Merwyn Katos, MD 02/28/20 [phone]

## 2020-02-28 NOTE — ED Notes (Signed)
Pt endorsing that they took 12 500mg  tylenol at approx 5pm due to their headache pain. Pt denies any nausea at this time. MD aware.

## 2020-02-28 NOTE — ED Triage Notes (Signed)
Pt to ED via POV stating that her daughter tested positive for COVID last week. Pt states that she has been having sore throat, headache, and weakness. Pt states that she has felt very tired for the past 2 days. Pt denies fever but states that she has had chills at night.

## 2020-02-29 ENCOUNTER — Other Ambulatory Visit: Payer: Self-pay | Admitting: Physician Assistant

## 2020-02-29 ENCOUNTER — Encounter: Payer: Self-pay | Admitting: Physician Assistant

## 2020-02-29 DIAGNOSIS — I10 Essential (primary) hypertension: Secondary | ICD-10-CM

## 2020-02-29 DIAGNOSIS — U071 COVID-19: Secondary | ICD-10-CM

## 2020-02-29 NOTE — Progress Notes (Signed)
I connected by phone with Tara Woodward on 02/29/2020 at 8:21 PM to discuss the potential use of a new treatment for mild to moderate COVID-19 viral infection in non-hospitalized patients.  This patient is a 29 y.o. female that meets the FDA criteria for Emergency Use Authorization of COVID monoclonal antibody sotrovimab, casirivimab/imdevimab or bamlamivimab/estevimab.  Has a (+) direct SARS-CoV-2 viral test result  Has mild or moderate COVID-19   Is NOT hospitalized due to COVID-19  Is within 10 days of symptom onset  Has at least one of the high risk factor(s) for progression to severe COVID-19 and/or hospitalization as defined in EUA.  Specific high risk criteria : BMI > 25, Cardiovascular disease or hypertension and Other high risk medical condition per CDC:  high SVI   I have spoken and communicated the following to the patient or parent/caregiver regarding COVID monoclonal antibody treatment:  1. FDA has authorized the emergency use for the treatment of mild to moderate COVID-19 in adults and pediatric patients with positive results of direct SARS-CoV-2 viral testing who are 49 years of age and older weighing at least 40 kg, and who are at high risk for progressing to severe COVID-19 and/or hospitalization.  2. The significant known and potential risks and benefits of COVID monoclonal antibody, and the extent to which such potential risks and benefits are unknown.  3. Information on available alternative treatments and the risks and benefits of those alternatives, including clinical trials.  4. Patients treated with COVID monoclonal antibody should continue to self-isolate and use infection control measures (e.g., wear mask, isolate, social distance, avoid sharing personal items, clean and disinfect "high touch" surfaces, and frequent handwashing) according to CDC guidelines.   5. The patient or parent/caregiver has the option to accept or refuse COVID monoclonal antibody  treatment.  After reviewing this information with the patient, the patient has agreed to receive one of the available covid 19 monoclonal antibodies and will be provided an appropriate fact sheet prior to infusion.  Sx onset 12/23 . Set up for infusion wait list on 12/27 if a spot becomes available. Directions given to [facility]. Pt is aware that insurance will be charged an infusion fee (pt is uninsured). Pt is fully vaccinated. + in epic   [name] 02/29/2020 8:21 PM

## 2020-03-05 ENCOUNTER — Other Ambulatory Visit: Payer: Self-pay

## 2020-03-05 ENCOUNTER — Emergency Department
Admission: EM | Admit: 2020-03-05 | Discharge: 2020-03-05 | Disposition: A | Discharge status: Home / Self Care | Payer: HRSA Program | Attending: Emergency Medicine | Admitting: Emergency Medicine

## 2020-03-05 DIAGNOSIS — T391X1A Poisoning by 4-Aminophenol derivatives, accidental (unintentional), initial encounter: Secondary | ICD-10-CM | POA: Insufficient documentation

## 2020-03-05 DIAGNOSIS — U071 COVID-19: Secondary | ICD-10-CM | POA: Insufficient documentation

## 2020-03-05 DIAGNOSIS — R400 Somnolence: Secondary | ICD-10-CM | POA: Diagnosis not present

## 2020-03-05 DIAGNOSIS — I10 Essential (primary) hypertension: Secondary | ICD-10-CM | POA: Insufficient documentation

## 2020-03-05 DIAGNOSIS — Z79899 Other long term (current) drug therapy: Secondary | ICD-10-CM | POA: Diagnosis not present

## 2020-03-05 DIAGNOSIS — R42 Dizziness and giddiness: Secondary | ICD-10-CM | POA: Diagnosis not present

## 2020-03-05 DIAGNOSIS — R110 Nausea: Secondary | ICD-10-CM | POA: Diagnosis present

## 2020-03-05 LAB — CBC WITH DIFFERENTIAL/PLATELET
Abs Immature Granulocytes: 0.03 10*3/uL (ref 0.00–0.07)
Basophils Absolute: 0 10*3/uL (ref 0.0–0.1)
Basophils Relative: 0 %
Eosinophils Absolute: 0 10*3/uL (ref 0.0–0.5)
Eosinophils Relative: 0 %
HCT: 39.1 % (ref 36.0–46.0)
Hemoglobin: 13.4 g/dL (ref 12.0–15.0)
Immature Granulocytes: 0 %
Lymphocytes Relative: 21 %
Lymphs Abs: 2.2 10*3/uL (ref 0.7–4.0)
MCH: 30.7 pg (ref 26.0–34.0)
MCHC: 34.3 g/dL (ref 30.0–36.0)
MCV: 89.5 fL (ref 80.0–100.0)
Monocytes Absolute: 0.5 10*3/uL (ref 0.1–1.0)
Monocytes Relative: 5 %
Neutro Abs: 7.8 10*3/uL — ABNORMAL HIGH (ref 1.7–7.7)
Neutrophils Relative %: 74 %
Platelets: 448 10*3/uL — ABNORMAL HIGH (ref 150–400)
RBC: 4.37 MIL/uL (ref 3.87–5.11)
RDW: 12.8 % (ref 11.5–15.5)
WBC: 10.5 10*3/uL (ref 4.0–10.5)
nRBC: 0 % (ref 0.0–0.2)

## 2020-03-05 LAB — COMPREHENSIVE METABOLIC PANEL
ALT: 17 U/L (ref 0–44)
AST: 26 U/L (ref 15–41)
Albumin: 4.5 g/dL (ref 3.5–5.0)
Alkaline Phosphatase: 76 U/L (ref 38–126)
Anion gap: 12 (ref 5–15)
BUN: 9 mg/dL (ref 6–20)
CO2: 19 mmol/L — ABNORMAL LOW (ref 22–32)
Calcium: 8.7 mg/dL — ABNORMAL LOW (ref 8.9–10.3)
Chloride: 103 mmol/L (ref 98–111)
Creatinine, Ser: 0.35 mg/dL — ABNORMAL LOW (ref 0.44–1.00)
GFR, Estimated: >60 mL/min (ref >60)
Glucose, Bld: 119 mg/dL — ABNORMAL HIGH (ref 70–99)
Potassium: 3.4 mmol/L — ABNORMAL LOW (ref 3.5–5.1)
Sodium: 134 mmol/L — ABNORMAL LOW (ref 135–145)
Total Bilirubin: 0.7 mg/dL (ref 0.3–1.2)
Total Protein: 8.3 g/dL — ABNORMAL HIGH (ref 6.5–8.1)

## 2020-03-05 LAB — PROTIME-INR
INR: 0.9 (ref 0.8–1.2)
Prothrombin Time: 12.2 seconds (ref 11.4–15.2)

## 2020-03-05 LAB — RESP PANEL BY RT-PCR (FLU A&B, COVID) ARPGX2
Influenza A by PCR: NEGATIVE
Influenza B by PCR: NEGATIVE
SARS Coronavirus 2 by RT PCR: POSITIVE — AB

## 2020-03-05 LAB — SALICYLATE LEVEL: Salicylate Lvl: <7 mg/dL — ABNORMAL LOW (ref 7.0–30.0)

## 2020-03-05 LAB — ETHANOL: Alcohol, Ethyl (B): <10 mg/dL (ref <10)

## 2020-03-05 LAB — HCG, QUANTITATIVE, PREGNANCY: hCG, Beta Chain, Quant, S: <1 m[IU]/mL (ref <5)

## 2020-03-05 LAB — ACETAMINOPHEN LEVEL
Acetaminophen (Tylenol), Serum: 192 ug/mL (ref 10–30)
Acetaminophen (Tylenol), Serum: 143 ug/mL — ABNORMAL HIGH (ref 10–30)

## 2020-03-05 MED ORDER — SODIUM CHLORIDE 0.9 % IV BOLUS
1000.0000 mL | Freq: Once | INTRAVENOUS | Status: AC
  Administered 2020-03-05: 1000 mL via INTRAVENOUS

## 2020-03-05 MED ORDER — AMOXICILLIN-POT CLAVULANATE 875-125 MG PO TABS: 1.0000 | ORAL_TABLET | Freq: Two times a day (BID) | ORAL | 0 refills | Status: AC

## 2020-03-05 MED ORDER — DEXTROSE 5 % IV SOLN
15.0000 mg/kg/h | INTRAVENOUS | Status: DC
  Filled 2020-03-05 (×2): qty 120

## 2020-03-05 MED ORDER — IBUPROFEN 600 MG PO TABS: 600.0000 mg | ORAL_TABLET | Freq: Three times a day (TID) | ORAL | 0 refills | Status: DC | PRN

## 2020-03-05 MED ORDER — SODIUM CHLORIDE 0.9 % IV SOLN: Freq: Once | INTRAVENOUS | Status: AC

## 2020-03-05 MED ORDER — BUPIVACAINE-EPINEPHRINE (PF) 0.5% -1:200000 IJ SOLN: 10.0000 mL | Freq: Once | INTRAMUSCULAR | Status: DC

## 2020-03-05 MED ORDER — ACETYLCYSTEINE LOAD VIA INFUSION
130.0000 mg/kg | Freq: Once | INTRAVENOUS | Status: DC
  Filled 2020-03-05: qty 379

## 2020-03-05 MED ORDER — ONDANSETRON HCL 4 MG/2ML IJ SOLN
4.0000 mg | Freq: Once | INTRAMUSCULAR | Status: AC
  Administered 2020-03-05: 4 mg via INTRAVENOUS
  Filled 2020-03-05: qty 2

## 2020-03-05 NOTE — ED Notes (Signed)
This RN contacted poison control @ [phone]. Poison control recommendations are to obtain an EKG now, administer zofran for nausea if EKG is WDL. Draw a tylenol level at 6pm (for a four hour tylenol level).

## 2020-03-05 NOTE — ED Notes (Signed)
md into speak with pt

## 2020-03-05 NOTE — ED Notes (Signed)
Isaacs MD notified of critical acetaminophen level

## 2020-03-05 NOTE — ED Notes (Signed)
Pt discharged by andrea, rn.

## 2020-03-05 NOTE — ED Triage Notes (Signed)
FIRST NURSE NOTE: Pt arrived via [facility] with reports of OD on Tylenol 500mg  tabs, pt reported to EMS that she took about 15 for a sore tooth and reported that she took a "handful" and did not realize it was too much.  Per EMS pt denied it being an SI attempt.  Pt c/o being sleepy, lethargic and having nausea.  Pt given 250cc IVF   BP was 110 palpated, CBG =122, p-90

## 2020-03-05 NOTE — Discharge Instructions (Addendum)
It is critically important that you stop taking any Tylenol or its generic, acetaminophen.  I recommend taking the prescribed ibuprofen as needed for pain.  I have prescribed an antibiotic to help with your tooth pain.  I recommend avoiding any alcohol for the next week, given your increased Tylenol usage and possible effects on the liver.  Do not take any Tylenol for the next week, then only take as prescribed or recommended on the bottle.  Do not take more than 3000 mg daily.

## 2020-03-05 NOTE — ED Notes (Signed)
2112Patient notified of being COVID +, to follow CDC guidelines for quarantine.  Also instructed to expect call from [facility] Department for contact tracting.

## 2020-03-05 NOTE — ED Provider Notes (Addendum)
[facility] Emergency Department Provider Note  ____________________________________________   Event Date/Time   First MD Initiated Contact with Patient 03/05/20 1556     (approximate)  I have reviewed the triage vital signs and the nursing notes.   HISTORY  Chief Complaint Drug Overdose    HPI Tara Woodward is a 29 y.o. female  With h/o HTN, HLD, obesity, here with weakness, nausea. Pt reports that she has had progressively worsening right lower dental pain "for weeks." She has not been able to see a dentist 2/2 cost. She reports she has been taking tylenol, approx 500 mg every 4-6 hr, off and on for weeks. This afternoon, the pain seemed worse, 10/10, aching, stabbing, so she took "a handful" of tylenol. Unknown how much, but estimated >15 tablets of 500 mg. Reports she has since had some nausea, drowsiness. She has vomited once. Pain remains severe in tooth. No other complaints. Of note, diagnosed COVID+ on 12/25 - has not had any cough, SOB, or other major sx related to this. She is adamant this was because her pain was severe and was NOT an attempt to harm herself.       Past Medical History:  Diagnosis Date  . HTN (hypertension)   . Hyperthyroidism   . Morbid obesity with BMI of 45.0-49.9, adult [facility])     Patient Active Problem List   Diagnosis Date Noted  . Severe major depression, single episode, without psychotic features (HCC) 11/08/2016  . HTN (hypertension) 01/24/2013    History reviewed. No pertinent surgical history.  Prior to Admission medications   Medication Sig Start Date End Date Taking? Authorizing Provider  acetaminophen (TYLENOL) 500 MG tablet Take 1,000 mg by mouth every 6 (six) hours as needed for moderate pain.   Yes [provider]  amoxicillin-clavulanate (AUGMENTIN) 875-125 MG tablet Take 1 tablet by mouth 2 (two) times daily for 7 days. 03/05/20 03/12/20 Yes Shaune Pollack, MD  chlorthalidone (HYGROTON) 25 MG  tablet Take 12.5 mg by mouth daily. 01/01/20  Yes [provider]  ibuprofen (ADVIL) 600 MG tablet Take 1 tablet (600 mg total) by mouth every 8 (eight) hours as needed for moderate pain. 03/05/20  Yes Shaune Pollack, MD  levothyroxine (SYNTHROID) 100 MCG tablet Take 100 mcg by mouth daily. 01/02/20  Yes [provider]    Allergies Patient has no known allergies.  Family History  Problem Relation Age of Onset  . Diabetes Maternal Aunt   . Cancer Maternal Aunt        breast  . Hypertension Maternal Aunt   . Hypertension Maternal Grandmother   . Diabetes Maternal Grandfather   . Cancer Maternal Grandfather        lung ca    Social History Social History   Tobacco Use  . Smoking status: Never Smoker  . Smokeless tobacco: Never Used  Substance Use Topics  . Alcohol use: No  . Drug use: No    Review of Systems  Review of Systems  Constitutional: Positive for fatigue. Negative for fever.  HENT: Positive for dental problem. Negative for congestion and sore throat.   Eyes: Negative for visual disturbance.  Respiratory: Negative for cough and shortness of breath.   Cardiovascular: Negative for chest pain.  Gastrointestinal: Positive for nausea. Negative for abdominal pain, diarrhea and vomiting.  Genitourinary: Negative for flank pain.  Musculoskeletal: Negative for back pain and neck pain.  Skin: Negative for rash and wound.  Neurological: Negative for weakness.  All other systems reviewed and are negative.    ____________________________________________  PHYSICAL EXAM:      VITAL SIGNS: ED Triage Vitals  Enc Vitals Group     BP 03/05/20 1519 (!) 149/98     Pulse Rate 03/05/20 1519 89     Resp 03/05/20 1519 (!) 22     Temp 03/05/20 1519 98.7 F (37.1 C)     Temp Source 03/05/20 1519 Oral     SpO2 03/05/20 1519 99 %     Weight 03/05/20 1518 257 lb (116.6 kg)     Height 03/05/20 1518 5\' 5"  (1.651 m)     Head Circumference --      Peak Flow --       Pain Score 03/05/20 1525 3     Pain Loc --      Pain Edu? --      Excl. in GC? --      Physical Exam Vitals and nursing note reviewed.  Constitutional:      General: She is not in acute distress.    Appearance: She is well-developed.  HENT:     Head: Normocephalic and atraumatic.     Mouth/Throat:     Comments: Poor dentition throughout. Dental caries worse on R mandibular molars. No gingival erythema, swelling, or abscess appreciated. Eyes:     Conjunctiva/sclera: Conjunctivae normal.  Cardiovascular:     Rate and Rhythm: Normal rate and regular rhythm.     Heart sounds: Normal heart sounds. No murmur heard. No friction rub.  Pulmonary:     Effort: Pulmonary effort is normal. No respiratory distress.     Breath sounds: Normal breath sounds. No wheezing or rales.  Abdominal:     General: There is no distension.     Palpations: Abdomen is soft.     Tenderness: There is no abdominal tenderness.  Musculoskeletal:     Cervical back: Neck supple.  Skin:    General: Skin is warm.     Capillary Refill: Capillary refill takes less than 2 seconds.  Neurological:     Mental Status: She is alert and oriented to person, place, and time.     Motor: No abnormal muscle tone.       ____________________________________________   LABS (all labs ordered are listed, but only abnormal results are displayed)  Labs Reviewed  CBC WITH DIFFERENTIAL/PLATELET - Abnormal; Notable for the following components:      Result Value   Platelets 448 (*)    Neutro Abs 7.8 (*)    All other components within normal limits  COMPREHENSIVE METABOLIC PANEL - Abnormal; Notable for the following components:   Sodium 134 (*)    Potassium 3.4 (*)    CO2 19 (*)    Glucose, Bld 119 (*)    Creatinine, Ser 0.35 (*)    Calcium 8.7 (*)    Total Protein 8.3 (*)    All other components within normal limits  SALICYLATE LEVEL - Abnormal; Notable for the following components:   Salicylate Lvl <7.0 (*)     All other components within normal limits  ACETAMINOPHEN LEVEL - Abnormal; Notable for the following components:   Acetaminophen (Tylenol), Serum 192 (*)    All other components within normal limits  ACETAMINOPHEN LEVEL - Abnormal; Notable for the following components:   Acetaminophen (Tylenol), Serum 143 (*)    All other components within normal limits  RESP PANEL BY RT-PCR (FLU A&B, COVID) ARPGX2  PROTIME-INR  ETHANOL  HCG, QUANTITATIVE, PREGNANCY  URINE DRUG SCREEN, QUALITATIVE ([facility] ONLY)    ____________________________________________  EKG: Normal sinus rhythm, VR 86. PR 144, QRS 82, QTc 476. No acute ST elevations or depressions. No ischemia or infarct. ________________________________________  RADIOLOGY All imaging, including plain films, CT scans, and ultrasounds, independently reviewed by me, and interpretations confirmed via formal radiology reads.  ED MD interpretation:     Official radiology report(s): No results found.  ____________________________________________  PROCEDURES   Procedure(s) performed (including Critical Care):  Procedures  ____________________________________________  INITIAL IMPRESSION / MDM / ASSESSMENT AND PLAN / ED COURSE  As part of my medical decision making, I reviewed the following data within the electronic MEDICAL RECORD NUMBER Nursing notes reviewed and incorporated, Old chart reviewed, Notes from prior ED visits, and [place] Controlled Substance Database       *Tara Woodward was evaluated in Emergency Department on 03/05/2020 for the symptoms described in the history of present illness. She was evaluated in the context of the global COVID-19 pandemic, which necessitated consideration that the patient might be at risk for infection with the SARS-CoV-2 virus that causes COVID-19. Institutional protocols and algorithms that pertain to the evaluation of patients at risk for COVID-19 are in a state of rapid change based on information released  by regulatory bodies including the CDC and federal and state organizations. These policies and algorithms were followed during the patient's care in the ED.  Some ED evaluations and interventions may be delayed as a result of limited staffing during the pandemic.*     Medical Decision Making: 29 year old female here with toothache and accidental Tylenol overdose.  Of note, the patient is seen somewhat similar symptoms on 12/25.  She is adamant that she did not do this in self-harm but admits that she has been taking this for tooth pain.  She is calm, cooperative, not intoxicated, denies any intentional overdose.  Her 4-hour Tylenol level is 143 here.  This is downtrending from her initial presentation level.  I did very long discussion with poison control, who is aware of her repeat levels and given her normal LFTs and otherwise level below treatment threshold, they recommend she can be cleared from an overdose perspective.  I had a long discussion with the patient and reiterated that she needs to stop Tylenol, and take this only as recommended on the bottle.  I recommended that she stop this for at least the next week as well as stop any alcohol or other hepatotoxins.  Will give her a brief course of analgesia as well as Motrin for her tooth pain.  No signs of abscess or complications related to this.  Will discharge with outpatient follow-up.  From a Covid perspective, the patient is satting well on room air.  She has no signs of hypoxia or complications related to this.  To continue supportive care.  ____________________________________________  FINAL CLINICAL IMPRESSION(S) / ED DIAGNOSES  Final diagnoses:  Tylenol overdose, accidental or unintentional, initial encounter  COVID-19     MEDICATIONS GIVEN DURING THIS VISIT:  Medications  bupivacaine-epinephrine (MARCAINE W/ EPI) 0.5% -1:200000 injection 10 mL (has no administration in time range)  sodium chloride 0.9 % bolus 1,000 mL (0 mLs  Intravenous Stopped 03/05/20 1909)  ondansetron (ZOFRAN) injection 4 mg (4 mg Intravenous Given 03/05/20 1813)  0.9 %  sodium chloride infusion ( Intravenous New Bag/Given 03/05/20 1814)     ED Discharge Orders         Ordered    amoxicillin-clavulanate (AUGMENTIN) 875-125 MG tablet  2 times daily        03/05/20 1945    ibuprofen (ADVIL) 600 MG tablet  Every 8 hours PRN        03/05/20 1945           Note:  This document was prepared using Dragon voice recognition software and may include unintentional dictation errors.   Shaune Pollack, MD 03/05/20 Karolee Ohs    Shaune Pollack, MD 03/05/20 [phone]

## 2020-03-05 NOTE — ED Notes (Signed)
Pt dry heaving, placed in between triage 1 and triage 2, pt hooked up to BP monitor and pulse ox, pt is alert at this time.  Charge RN aware pt needs next bed.

## 2021-03-15 ENCOUNTER — Ambulatory Visit (INDEPENDENT_AMBULATORY_CARE_PROVIDER_SITE_OTHER): Payer: 59 | Admitting: Internal Medicine

## 2021-03-15 ENCOUNTER — Encounter: Payer: Self-pay | Admitting: Internal Medicine

## 2021-03-15 ENCOUNTER — Other Ambulatory Visit: Payer: Self-pay

## 2021-03-15 VITALS — BP 152/103 | HR 88 | Temp 98.2°F | Ht 65.0 in | Wt 295.0 lb

## 2021-03-15 DIAGNOSIS — I10 Essential (primary) hypertension: Secondary | ICD-10-CM

## 2021-03-15 DIAGNOSIS — Z6841 Body Mass Index (BMI) 40.0 and over, adult: Secondary | ICD-10-CM | POA: Insufficient documentation

## 2021-03-15 DIAGNOSIS — F330 Major depressive disorder, recurrent, mild: Secondary | ICD-10-CM

## 2021-03-15 DIAGNOSIS — Z23 Encounter for immunization: Secondary | ICD-10-CM | POA: Diagnosis not present

## 2021-03-15 DIAGNOSIS — E039 Hypothyroidism, unspecified: Secondary | ICD-10-CM

## 2021-03-15 MED ORDER — LOSARTAN POTASSIUM 25 MG PO TABS
25.0000 mg | ORAL_TABLET | Freq: Every day | ORAL | 0 refills | Status: DC
Start: 2021-03-15 — End: 2021-04-11

## 2021-03-15 MED ORDER — CITALOPRAM HYDROBROMIDE 20 MG PO TABS: 20.0000 mg | ORAL_TABLET | Freq: Every day | ORAL | 0 refills | Status: DC

## 2021-03-15 NOTE — Progress Notes (Signed)
HPI  Patient presents the clinic today to establish care and for management of the conditions listed below.  She has not had a PCP in many years.  HTN: Her BP today is 152/103. She was on Chlorthalidone in the past but had no insurance so she was not able to afford this. She does have intermittent headaches, dizziness, chest tightness. ECG from 02/2020 reviewed.  Hypothyroidism: She has not had her thyroid check in many years. She is not currently taking her Levothyroxine as prescribed. She no longer follows with endocrinology.  Depression: Chronic, but she has been out of her Citalopram. She is not currently a therapist. She denies anxiety, SI/HI.  Insomnia: She has trouble falling asleep. She is not currently taking any medication for this. She does snore at night. She has not been told that she has apneic spells.  She feels rested when she wakes up for the most part.  She has never been told that she has sleep apnea.  Past Medical History:  Diagnosis Date   Depression    HTN (hypertension)    Hyperthyroidism    Morbid obesity with BMI of 45.0-49.9, adult [facility])     Current Outpatient Medications  Medication Sig Dispense Refill   acetaminophen (TYLENOL) 500 MG tablet Take 1,000 mg by mouth every 6 (six) hours as needed for moderate pain.     levothyroxine (SYNTHROID) 100 MCG tablet Take 100 mcg by mouth daily. (Patient not taking: Reported on 03/15/2021)     Current Facility-Administered Medications  Medication Dose Route Frequency Provider Last Rate Last Admin   levonorgestrel (MIRENA) 20 MCG/24HR IUD 1 each  1 Intra Uterine Device Intrauterine Once Anyanwu, Ugonna A, MD        No Known Allergies  Family History  Problem Relation Age of Onset   Depression Mother    Bipolar disorder Mother    Bipolar disorder Father    Diabetes Maternal Aunt    Cancer Maternal Aunt        breast   Hypertension Maternal Aunt    Hypertension Maternal Grandmother    Diabetes Maternal  Grandfather    Cancer Maternal Grandfather        lung ca    Social History   Socioeconomic History   Marital status: Married    Spouse name: Not on file   Number of children: Not on file   Years of education: Not on file   Highest education level: Not on file  Occupational History   Not on file  Tobacco Use   Smoking status: Never   Smokeless tobacco: Never  Vaping Use   Vaping Use: Never used  Substance and Sexual Activity   Alcohol use: No   Drug use: No   Sexual activity: Yes    Partners: Male    Birth control/protection: I.U.D.  Other Topics Concern   Not on file  Social History Narrative   Not on file   Social Determinants of Health   Financial Resource Strain: Not on file  Food Insecurity: Not on file  Transportation Needs: Not on file  Physical Activity: Not on file  Stress: Not on file  Social Connections: Not on file  Intimate Partner Violence: Not on file    ROS:  Constitutional: Patient reports intermittent headaches.  Denies fever, malaise, fatigue, or abrupt weight changes.  HEENT: Patient reports intermittent vision changes.  Denies eye pain, eye redness, ear pain, ringing in the ears, wax buildup, runny nose, nasal congestion, bloody nose, or sore  throat. Respiratory: Denies difficulty breathing, shortness of breath, cough or sputum production.   Cardiovascular: Patient reports intermittent chest tightness.  Denies chest pain, palpitations or swelling in the hands or feet.  Gastrointestinal: Denies abdominal pain, bloating, constipation, diarrhea or blood in the stool.  GU: Denies frequency, urgency, pain with urination, blood in urine, odor or discharge. Musculoskeletal: Pt reports intermittent knee pain. Denies decrease in range of motion, difficulty with gait, muscle pain or joint swelling.  Skin: Denies redness, rashes, lesions or ulcercations.  Neurological: Patient reports insomnia.  Denies dizziness, difficulty with memory, difficulty with  speech or problems with balance and coordination.  Psych: Patient has a history of depression.  Denies anxiety, SI/HI.  No other specific complaints in a complete review of systems (except as listed in HPI above).  PE:  BP (!) 152/103    Pulse 88    Temp 98.2 F (36.8 C) (Oral)    Ht 5\' 5"  (1.651 m)    Wt 295 lb (133.8 kg)    SpO2 100%    BMI 49.09 kg/m  Wt Readings from Last 3 Encounters:  03/15/21 295 lb (133.8 kg)  03/05/20 257 lb (116.6 kg)  02/28/20 257 lb 15 oz (117 kg)    General: Appears her stated age, obese, in NAD. HEENT: Head: normal shape and size; Eyes: sclera white and EOMs intact;  Neck: Neck supple, trachea midline. No masses, lumps or thyromegaly present.  Cardiovascular: Normal rate and rhythm. S1,S2 noted.  No murmur, rubs or gallops noted. No JVD or BLE edema.  Pulmonary/Chest: Normal effort and positive vesicular breath sounds. No respiratory distress. No wheezes, rales or ronchi noted.  Musculoskeletal:  No difficulty with gait.  Neurological: Alert and oriented. Cranial nerves II-XII grossly intact. Coordination normal.  Psychiatric: Mood and affect normal. Behavior is normal. Judgment and thought content normal.    BMET    Component Value Date/Time   NA 134 (L) 03/05/2020 1542   K 3.4 (L) 03/05/2020 1542   CL 103 03/05/2020 1542   CO2 19 (L) 03/05/2020 1542   GLUCOSE 119 (H) 03/05/2020 1542   BUN 9 03/05/2020 1542   CREATININE 0.35 (L) 03/05/2020 1542   CALCIUM 8.7 (L) 03/05/2020 1542   GFRNONAA >60 03/05/2020 1542   GFRAA >60 12/09/2019 0551    Lipid Panel  No results found for: CHOL, TRIG, HDL, CHOLHDL, VLDL, LDLCALC  CBC    Component Value Date/Time   WBC 10.5 03/05/2020 1542   RBC 4.37 03/05/2020 1542   HGB 13.4 03/05/2020 1542   HCT 39.1 03/05/2020 1542   PLT 448 (H) 03/05/2020 1542   MCV 89.5 03/05/2020 1542   MCH 30.7 03/05/2020 1542   MCHC 34.3 03/05/2020 1542   RDW 12.8 03/05/2020 1542   LYMPHSABS 2.2 03/05/2020 1542   MONOABS  0.5 03/05/2020 1542   EOSABS 0.0 03/05/2020 1542   BASOSABS 0.0 03/05/2020 1542    Hgb A1C No results found for: HGBA1C   Assessment and Plan:   Webb Silversmith, NP This visit occurred during the SARS-CoV-2 public health emergency.  Safety protocols were in place, including screening questions prior to the visit, additional usage of staff PPE, and extensive cleaning of exam room while observing appropriate contact time as indicated for disinfecting solutions.

## 2021-03-15 NOTE — Patient Instructions (Signed)

## 2021-03-15 NOTE — Assessment & Plan Note (Signed)
Encouraged diet and exercise for weight loss ?

## 2021-03-15 NOTE — Assessment & Plan Note (Signed)
TSH and Free T4 today Will restart Levothyroxine if needed based on labs

## 2021-03-15 NOTE — Assessment & Plan Note (Signed)
Uncontrolled off meds Will start Losartan 25 mg daily CMET today Reinforced DASH diet and exercise for weight loss  RTC in 1 month for annual exam

## 2021-03-15 NOTE — Assessment & Plan Note (Signed)
Will restart Citalopram Support offered

## 2021-03-16 LAB — HEMOGLOBIN A1C
Hgb A1c MFr Bld: 4.9 % of total Hgb (ref <5.7)
Mean Plasma Glucose: 94 mg/dL
eAG (mmol/L): 5.2 mmol/L

## 2021-03-16 LAB — COMPLETE METABOLIC PANEL WITH GFR
AG Ratio: 1.5 (calc) (ref 1.0–2.5)
ALT: 10 U/L (ref 6–29)
AST: 18 U/L (ref 10–30)
Albumin: 4.6 g/dL (ref 3.6–5.1)
Alkaline phosphatase (APISO): 80 U/L (ref 31–125)
BUN: 11 mg/dL (ref 7–25)
CO2: 23 mmol/L (ref 20–32)
Calcium: 9.2 mg/dL (ref 8.6–10.2)
Chloride: 101 mmol/L (ref 98–110)
Creat: 0.63 mg/dL (ref 0.50–0.97)
Globulin: 3.1 g/dL (calc) (ref 1.9–3.7)
Glucose, Bld: 72 mg/dL (ref 65–99)
Potassium: 4 mmol/L (ref 3.5–5.3)
Sodium: 136 mmol/L (ref 135–146)
Total Bilirubin: 0.5 mg/dL (ref 0.2–1.2)
Total Protein: 7.7 g/dL (ref 6.1–8.1)
eGFR: 121 mL/min/{1.73_m2} (ref >=60)

## 2021-03-16 LAB — CBC
HCT: 40 % (ref 35.0–45.0)
Hemoglobin: 13.8 g/dL (ref 11.7–15.5)
MCH: 29.4 pg (ref 27.0–33.0)
MCHC: 34.5 g/dL (ref 32.0–36.0)
MCV: 85.1 fL (ref 80.0–100.0)
MPV: 9 fL (ref 7.5–12.5)
Platelets: 395 10*3/uL (ref 140–400)
RBC: 4.7 10*6/uL (ref 3.80–5.10)
RDW: 13.9 % (ref 11.0–15.0)
WBC: 7.6 10*3/uL (ref 3.8–10.8)

## 2021-03-16 LAB — LIPID PANEL
Cholesterol: 306 mg/dL — ABNORMAL HIGH (ref <200)
HDL: 51 mg/dL (ref >=50)
LDL Cholesterol (Calc): 199 mg/dL (calc) — ABNORMAL HIGH
Non-HDL Cholesterol (Calc): 255 mg/dL (calc) — ABNORMAL HIGH (ref <130)
Total CHOL/HDL Ratio: 6 (calc) — ABNORMAL HIGH (ref <5.0)
Triglycerides: 331 mg/dL — ABNORMAL HIGH (ref <150)

## 2021-03-16 LAB — T4, FREE: Free T4: 0.3 ng/dL — ABNORMAL LOW (ref 0.8–1.8)

## 2021-03-16 LAB — TSH: TSH: >150 mIU/L — ABNORMAL HIGH

## 2021-04-06 ENCOUNTER — Other Ambulatory Visit: Payer: Self-pay | Admitting: Internal Medicine

## 2021-04-06 NOTE — Telephone Encounter (Signed)
Requesting early

## 2021-04-11 ENCOUNTER — Other Ambulatory Visit: Payer: Self-pay | Admitting: Internal Medicine

## 2021-04-11 NOTE — Telephone Encounter (Signed)
Requested Prescriptions  Pending Prescriptions Disp Refills   losartan (COZAAR) 25 MG tablet [Pharmacy Med Name: LOSARTAN POTASSIUM 25 MG TAB] 30 tablet 0    Sig: TAKE 1 TABLET (25 MG TOTAL) BY MOUTH DAILY.     Cardiovascular:  Angiotensin Receptor Blockers Failed - 04/11/2021  9:32 AM      Failed - Last BP in normal range    BP Readings from Last 1 Encounters:  03/15/21 (!) 152/103         Passed - Cr in normal range and within 180 days    Creat  Date Value Ref Range Status  03/15/2021 0.63 0.50 - 0.97 mg/dL Final   Creatinine, Urine  Date Value Ref Range Status  02/19/2008 57.3 mg/dL Final         Passed - K in normal range and within 180 days    Potassium  Date Value Ref Range Status  03/15/2021 4.0 3.5 - 5.3 mmol/L Final         Passed - Patient is not pregnant      Passed - Valid encounter within last 6 months    Recent Outpatient Visits          3 weeks ago Primary hypertension   [facility], Salvadore Oxford, NP      Future Appointments            In 1 week [name], Salvadore Oxford, NP [facility]

## 2021-04-19 ENCOUNTER — Other Ambulatory Visit: Payer: Self-pay

## 2021-04-19 ENCOUNTER — Encounter: Payer: Self-pay | Admitting: Internal Medicine

## 2021-04-19 ENCOUNTER — Ambulatory Visit (INDEPENDENT_AMBULATORY_CARE_PROVIDER_SITE_OTHER): Payer: 59 | Admitting: Internal Medicine

## 2021-04-19 DIAGNOSIS — E782 Mixed hyperlipidemia: Secondary | ICD-10-CM | POA: Diagnosis not present

## 2021-04-19 DIAGNOSIS — E039 Hypothyroidism, unspecified: Secondary | ICD-10-CM

## 2021-04-19 DIAGNOSIS — I10 Essential (primary) hypertension: Secondary | ICD-10-CM | POA: Diagnosis not present

## 2021-04-19 MED ORDER — LOSARTAN POTASSIUM 50 MG PO TABS: 50.0000 mg | ORAL_TABLET | Freq: Every day | ORAL | 0 refills | Status: DC

## 2021-04-19 MED ORDER — ATORVASTATIN CALCIUM 10 MG PO TABS: 10.0000 mg | ORAL_TABLET | Freq: Every day | ORAL | 0 refills | Status: DC

## 2021-04-19 MED ORDER — LEVOTHYROXINE SODIUM 100 MCG PO TABS: 100.0000 ug | ORAL_TABLET | Freq: Every day | ORAL | 1 refills | Status: DC

## 2021-04-19 NOTE — Progress Notes (Signed)
Subjective:    Patient ID: Tara Woodward, female    DOB: 27-Mar-1990, 31 y.o.   MRN: [id number]  HPI  Patient presents the clinic today for 1 month follow-up of HTN.  At her last visit she was started on Losartan.  She has been taking the medications as prescribed.  She denies adverse side effects.  Her BP today is 139/97.  ECG from 02/2020 reviewed.  Her TSH was also noted to be >150.  It was advised that she get started on Levothyroxine which she has been on in the past.  She did not get the message and has not restarted this medication.  Her last LDL was 199, triglycerides 331, 03/2021.  She is not taking any cholesterol-lowering medication at this time.  It was recommended that she start cholesterol-lowering medication but she did not get the message.  She does not consume a low-fat diet.  Review of Systems  Past Medical History:  Diagnosis Date   Depression    HTN (hypertension)    Hyperthyroidism    Morbid obesity with BMI of 45.0-49.9, adult (HCC)     Current Outpatient Medications  Medication Sig Dispense Refill   acetaminophen (TYLENOL) 500 MG tablet Take 1,000 mg by mouth every 6 (six) hours as needed for moderate pain.     atorvastatin (LIPITOR) 10 MG tablet Take 1 tablet (10 mg total) by mouth daily. 90 tablet 0   citalopram (CELEXA) 20 MG tablet Take 1 tablet (20 mg total) by mouth daily. 90 tablet 0   levothyroxine (SYNTHROID) 100 MCG tablet Take 1 tablet (100 mcg total) by mouth daily. 30 tablet 1   losartan (COZAAR) 25 MG tablet TAKE 1 TABLET (25 MG TOTAL) BY MOUTH DAILY. 30 tablet 0   Current Facility-Administered Medications  Medication Dose Route Frequency Provider Last Rate Last Admin   levonorgestrel (MIRENA) 20 MCG/24HR IUD 1 each  1 Intra Uterine Device Intrauterine Once Anyanwu, Ugonna A, MD        No Known Allergies  Family History  Problem Relation Age of Onset   Depression Mother    Bipolar disorder Mother    Bipolar disorder Father    Diabetes  Maternal Aunt    Cancer Maternal Aunt        breast   Hypertension Maternal Aunt    Hypertension Maternal Grandmother    Diabetes Maternal Grandfather    Cancer Maternal Grandfather        lung ca    Social History   Socioeconomic History   Marital status: Married    Spouse name: Not on file   Number of children: Not on file   Years of education: Not on file   Highest education level: Not on file  Occupational History   Not on file  Tobacco Use   Smoking status: Never   Smokeless tobacco: Never  Vaping Use   Vaping Use: Never used  Substance and Sexual Activity   Alcohol use: No   Drug use: No   Sexual activity: Yes    Partners: Male    Birth control/protection: I.U.D.  Other Topics Concern   Not on file  Social History Narrative   Not on file   Social Determinants of Health   Financial Resource Strain: Not on file  Food Insecurity: Not on file  Transportation Needs: Not on file  Physical Activity: Not on file  Stress: Not on file  Social Connections: Not on file  Intimate Partner Violence: Not on file  Constitutional: Patient reports intermittent headaches.  Denies fever, malaise, fatigue, or abrupt weight changes.  Respiratory: Denies difficulty breathing, shortness of breath, cough or sputum production.   Cardiovascular: Denies chest pain, chest tightness, palpitations or swelling in the hands or feet.  Neurological: Denies dizziness, difficulty with memory, difficulty with speech or problems with balance and coordination.    No other specific complaints in a complete review of systems (except as listed in HPI above).     Objective:   Physical Exam   BP (!) 139/97 (BP Location: Left Arm, Patient Position: Sitting, Cuff Size: Large)    Pulse 69    Temp (!) 97.3 F (36.3 C) (Temporal)    Wt 292 lb (132.5 kg)    SpO2 100%    BMI 48.59 kg/m  Wt Readings from Last 3 Encounters:  04/19/21 292 lb (132.5 kg)  03/15/21 295 lb (133.8 kg)  03/05/20 257 lb  (116.6 kg)    General: Appears her stated age, obese, in NAD. HEENT: Head: normal shape and size; Eyes: sclera white and EOMs intact; Neck:  Neck supple, trachea midline. No masses, lumps or thyromegaly present.  Cardiovascular: Normal rate and rhythm. S1,S2 noted.  No murmur, rubs or gallops noted. No JVD or BLE edema.  Pulmonary/Chest: Normal effort and positive vesicular breath sounds. No respiratory distress. No wheezes, rales or ronchi noted.  Musculoskeletal:  No difficulty with gait.  Neurological: Alert and oriented.    BMET    Component Value Date/Time   NA 136 03/15/2021 1034   K 4.0 03/15/2021 1034   CL 101 03/15/2021 1034   CO2 23 03/15/2021 1034   GLUCOSE 72 03/15/2021 1034   BUN 11 03/15/2021 1034   CREATININE 0.63 03/15/2021 1034   CALCIUM 9.2 03/15/2021 1034   GFRNONAA >60 03/05/2020 1542   GFRAA >60 12/09/2019 0551    Lipid Panel     Component Value Date/Time   CHOL 306 (H) 03/15/2021 1034   TRIG 331 (H) 03/15/2021 1034   HDL 51 03/15/2021 1034   CHOLHDL 6.0 (H) 03/15/2021 1034   LDLCALC 199 (H) 03/15/2021 1034    CBC    Component Value Date/Time   WBC 7.6 03/15/2021 1034   RBC 4.70 03/15/2021 1034   HGB 13.8 03/15/2021 1034   HCT 40.0 03/15/2021 1034   PLT 395 03/15/2021 1034   MCV 85.1 03/15/2021 1034   MCH 29.4 03/15/2021 1034   MCHC 34.5 03/15/2021 1034   RDW 13.9 03/15/2021 1034   LYMPHSABS 2.2 03/05/2020 1542   MONOABS 0.5 03/05/2020 1542   EOSABS 0.0 03/05/2020 1542   BASOSABS 0.0 03/05/2020 1542    Hgb A1C Lab Results  Component Value Date   HGBA1C 4.9 03/15/2021           Assessment & Plan:    Nicki Reaper, NP This visit occurred during the SARS-CoV-2 public health emergency.  Safety protocols were in place, including screening questions prior to the visit, additional usage of staff PPE, and extensive cleaning of exam room while observing appropriate contact time as indicated for disinfecting solutions.

## 2021-04-20 ENCOUNTER — Encounter: Payer: Self-pay | Admitting: Internal Medicine

## 2021-04-20 DIAGNOSIS — E785 Hyperlipidemia, unspecified: Secondary | ICD-10-CM | POA: Insufficient documentation

## 2021-04-20 NOTE — Patient Instructions (Signed)
Heart-Healthy Eating Plan Heart-healthy meal planning includes: Eating less unhealthy fats. Eating more healthy fats. Making other changes in your diet. Talk with your doctor or a diet specialist (dietitian) to create an eating plan that is right for you. What is my plan? Your doctor may recommend an eating plan that includes: Total fat: ______% or less of total calories a day. Saturated fat: ______% or less of total calories a day. Cholesterol: less than _________mg a day. What are tips for following this plan? Cooking Avoid frying your food. Try to bake, boil, grill, or broil it instead. You can also reduce fat by: Removing the skin from poultry. Removing all visible fats from meats. Steaming vegetables in water or broth. Meal planning  At meals, divide your plate into four equal parts: Fill one-half of your plate with vegetables and green salads. Fill one-fourth of your plate with whole grains. Fill one-fourth of your plate with lean protein foods. Eat 4-5 servings of vegetables per day. A serving of vegetables is: 1 cup of raw or cooked vegetables. 2 cups of raw leafy greens. Eat 4-5 servings of fruit per day. A serving of fruit is: 1 medium whole fruit.  cup of dried fruit.  cup of fresh, frozen, or canned fruit.  cup of 100% fruit juice. Eat more foods that have soluble fiber. These are apples, broccoli, carrots, beans, peas, and barley. Try to get 20-30 g of fiber per day. Eat 4-5 servings of nuts, legumes, and seeds per week: 1 serving of dried beans or legumes equals  cup after being cooked. 1 serving of nuts is  cup. 1 serving of seeds equals 1 tablespoon. General information Eat more home-cooked food. Eat less restaurant, buffet, and fast food. Limit or avoid alcohol. Limit foods that are high in starch and sugar. Avoid fried foods. Lose weight if you are overweight. Keep track of how much salt (sodium) you eat. This is important if you have high blood  pressure. Ask your doctor to tell you more about this. Try to add vegetarian meals each week. Fats Choose healthy fats. These include olive oil and canola oil, flaxseeds, walnuts, almonds, and seeds. Eat more omega-3 fats. These include salmon, mackerel, sardines, tuna, flaxseed oil, and ground flaxseeds. Try to eat fish at least 2 times each week. Check food labels. Avoid foods with trans fats or high amounts of saturated fat. Limit saturated fats. These are often found in animal products, such as meats, butter, and cream. These are also found in plant foods, such as palm oil, palm kernel oil, and coconut oil. Avoid foods with partially hydrogenated oils in them. These have trans fats. Examples are stick margarine, some tub margarines, cookies, crackers, and other baked goods. What foods can I eat? Fruits All fresh, canned (in natural juice), or frozen fruits. Vegetables Fresh or frozen vegetables (raw, steamed, roasted, or grilled). Green salads. Grains Most grains. Choose whole wheat and whole grains most of the time. Rice and pasta, including brown rice and pastas made with whole wheat. Meats and other proteins Lean, well-trimmed beef, veal, pork, and lamb. Chicken and turkey without skin. All fish and shellfish. Wild duck, rabbit, pheasant, and venison. Egg whites or low-cholesterol egg substitutes. Dried beans, peas, lentils, and tofu. Seeds and most nuts. Dairy Low-fat or nonfat cheeses, including ricotta and mozzarella. Skim or 1% milk that is liquid, powdered, or evaporated. Buttermilk that is made with low-fat milk. Nonfat or low-fat yogurt. Fats and oils Non-hydrogenated (trans-free) margarines. Vegetable oils, including   soybean, sesame, sunflower, olive, peanut, safflower, corn, canola, and cottonseed. Salad dressings or mayonnaise made with a vegetable oil. Beverages Mineral water. Coffee and tea. Diet carbonated beverages. Sweets and desserts Sherbet, gelatin, and fruit ice.  Small amounts of dark chocolate. Limit all sweets and desserts. Seasonings and condiments All seasonings and condiments. The items listed above may not be a complete list of foods and drinks you can eat. Contact a dietitian for more options. What foods should I avoid? Fruits Canned fruit in heavy syrup. Fruit in cream or butter sauce. Fried fruit. Limit coconut. Vegetables Vegetables cooked in cheese, cream, or butter sauce. Fried vegetables. Grains Breads that are made with saturated or trans fats, oils, or whole milk. Croissants. Sweet rolls. Donuts. High-fat crackers, such as cheese crackers. Meats and other proteins Fatty meats, such as hot dogs, ribs, sausage, bacon, rib-eye roast or steak. High-fat deli meats, such as salami and bologna. Caviar. Domestic duck and goose. Organ meats, such as liver. Dairy Cream, sour cream, cream cheese, and creamed cottage cheese. Whole-milk cheeses. Whole or 2% milk that is liquid, evaporated, or condensed. Whole buttermilk. Cream sauce or high-fat cheese sauce. Yogurt that is made from whole milk. Fats and oils Meat fat, or shortening. Cocoa butter, hydrogenated oils, palm oil, coconut oil, palm kernel oil. Solid fats and shortenings, including bacon fat, salt pork, lard, and butter. Nondairy cream substitutes. Salad dressings with cheese or sour cream. Beverages Regular sodas and juice drinks with added sugar. Sweets and desserts Frosting. Pudding. Cookies. Cakes. Pies. Milk chocolate or white chocolate. Buttered syrups. Full-fat ice cream or ice cream drinks. The items listed above may not be a complete list of foods and drinks to avoid. Contact a dietitian for more information. Summary Heart-healthy meal planning includes eating less unhealthy fats, eating more healthy fats, and making other changes in your diet. Eat a balanced diet. This includes fruits and vegetables, low-fat or nonfat dairy, lean protein, nuts and legumes, whole grains, and  heart-healthy oils and fats. This information is not intended to replace advice given to you by your health care provider. Make sure you discuss any questions you have with your health care provider. Document Revised: 07/01/2020 Document Reviewed: 07/01/2020 Elsevier Patient Education  2022 Elsevier Inc.  

## 2021-04-20 NOTE — Assessment & Plan Note (Signed)
Rx for Atorvastatin 10 mg daily Reinforced low-fat diet

## 2021-04-20 NOTE — Assessment & Plan Note (Signed)
Rx for Levothyroxine 100 mcg sent to pharmacy We will check TSH and free T4 in 1 month

## 2021-04-20 NOTE — Assessment & Plan Note (Signed)
Encourage diet and exercise for weight loss 

## 2021-04-20 NOTE — Assessment & Plan Note (Signed)
Improved but still not at goal Increase Losartan to 50 mg daily Reinforced DASH diet and exercise for weight loss

## 2021-05-05 ENCOUNTER — Other Ambulatory Visit: Payer: Self-pay | Admitting: Internal Medicine

## 2021-05-05 NOTE — Telephone Encounter (Signed)
Medication discontinued 04/19/21. Dose increased.  ?Requested Prescriptions  ?Refused Prescriptions Disp Refills  ?? losartan (COZAAR) 25 MG tablet [Pharmacy Med Name: LOSARTAN POTASSIUM 25 MG TAB] 30 tablet 0  ?  Sig: TAKE 1 TABLET (25 MG TOTAL) BY MOUTH DAILY.  ?  ? Cardiovascular:  Angiotensin Receptor Blockers Failed - 05/05/2021  2:31 PM  ?  ?  Failed - Last BP in normal range  ?  BP Readings from Last 1 Encounters:  ?04/19/21 (!) 142/96  ?   ?  ?  Passed - Cr in normal range and within 180 days  ?  Creat  ?Date Value Ref Range Status  ?03/15/2021 0.63 0.50 - 0.97 mg/dL Final  ? ?Creatinine, Urine  ?Date Value Ref Range Status  ?02/19/2008 57.3 mg/dL Final  ?   ?  ?  Passed - K in normal range and within 180 days  ?  Potassium  ?Date Value Ref Range Status  ?03/15/2021 4.0 3.5 - 5.3 mmol/L Final  ?   ?  ?  Passed - Patient is not pregnant  ?  ?  Passed - Valid encounter within last 6 months  ?  Recent Outpatient Visits   ?      ? 2 weeks ago Primary hypertension  ? [facility] [place], [place] W, NP  ? 1 month ago Primary hypertension  ? [facility] [place], Salvadore Oxford, NP  ?  ?  ?Future Appointments   ?        ? In 1 week Baity, Salvadore Oxford, NP [facility]  ?  ? ?  ?  ?  ? ?

## 2021-05-11 ENCOUNTER — Other Ambulatory Visit: Payer: Self-pay | Admitting: Internal Medicine

## 2021-05-12 NOTE — Telephone Encounter (Signed)
Requested too early, sent via Interface. Refill available. ?Requested Prescriptions  ?Pending Prescriptions Disp Refills  ?? levothyroxine (SYNTHROID) 100 MCG tablet [Pharmacy Med Name: LEVOTHYROXINE 100 MCG TABLET] 30 tablet 1  ?  Sig: TAKE 1 TABLET BY MOUTH EVERY DAY  ?  ? Endocrinology:  Hypothyroid Agents Failed - 05/11/2021 11:31 AM  ?  ?  Failed - TSH in normal range and within 360 days  ?  TSH  ?Date Value Ref Range Status  ?03/15/2021 >150.00 (H) mIU/L Final  ?  Comment:  ?            Reference Range ?. ?          > or = 20 Years  0.40-4.50 ?. ?               Pregnancy Ranges ?          First trimester    0.26-2.66 ?          Second trimester   0.55-2.73 ?          Third trimester    0.43-2.91 ?  ?   ?  ?  Passed - Valid encounter within last 12 months  ?  Recent Outpatient Visits   ?      ? 3 weeks ago Primary hypertension  ? [facility] [place], [place] W, NP  ? 1 month ago Primary hypertension  ? [facility] [place], Salvadore Oxford, NP  ?  ?  ?Future Appointments   ?        ? In 5 days Baity, Salvadore Oxford, NP [facility]  ?  ? ?  ?  ?  ?Signed Prescriptions Disp Refills  ? losartan (COZAAR) 50 MG tablet 30 tablet 0  ?  Sig: TAKE 1 TABLET BY MOUTH EVERY DAY  ?  ? Cardiovascular:  Angiotensin Receptor Blockers Failed - 05/11/2021 11:31 AM  ?  ?  Failed - Last BP in normal range  ?  BP Readings from Last 1 Encounters:  ?04/19/21 (!) 142/96  ?   ?  ?  Passed - Cr in normal range and within 180 days  ?  Creat  ?Date Value Ref Range Status  ?03/15/2021 0.63 0.50 - 0.97 mg/dL Final  ? ?Creatinine, Urine  ?Date Value Ref Range Status  ?02/19/2008 57.3 mg/dL Final  ?   ?  ?  Passed - K in normal range and within 180 days  ?  Potassium  ?Date Value Ref Range Status  ?03/15/2021 4.0 3.5 - 5.3 mmol/L Final  ?   ?  ?  Passed - Patient is not pregnant  ?  ?  Passed - Valid encounter within last 6 months  ?  Recent Outpatient Visits   ?      ? 3 weeks ago Primary hypertension  ? [facility] [place], [place] W, NP  ? 1 month ago Primary hypertension  ? [facility] [place], Salvadore Oxford, NP  ?  ?  ?Future Appointments   ?        ? In 5 days Baity, Salvadore Oxford, NP [facility], [facility]  ?  ? ?  ?  ?  ? ? ?

## 2021-05-12 NOTE — Telephone Encounter (Signed)
Requested Prescriptions  ?Pending Prescriptions Disp Refills  ?? levothyroxine (SYNTHROID) 100 MCG tablet [Pharmacy Med Name: LEVOTHYROXINE 100 MCG TABLET] 30 tablet 1  ?  Sig: TAKE 1 TABLET BY MOUTH EVERY DAY  ?  ? Endocrinology:  Hypothyroid Agents Failed - 05/11/2021 11:31 AM  ?  ?  Failed - TSH in normal range and within 360 days  ?  TSH  ?Date Value Ref Range Status  ?03/15/2021 >150.00 (H) mIU/L Final  ?  Comment:  ?            Reference Range ?. ?          > or = 20 Years  0.40-4.50 ?. ?               Pregnancy Ranges ?          First trimester    0.26-2.66 ?          Second trimester   0.55-2.73 ?          Third trimester    0.43-2.91 ?  ?   ?  ?  Passed - Valid encounter within last 12 months  ?  Recent Outpatient Visits   ?      ? 3 weeks ago Primary hypertension  ? [facility] [place], [place] W, NP  ? 1 month ago Primary hypertension  ? [facility] [place], Salvadore Oxford, NP  ?  ?  ?Future Appointments   ?        ? In 5 days Baity, Salvadore Oxford, NP [facility], [facility]  ?  ? ?  ?  ?  ?? losartan (COZAAR) 50 MG tablet [Pharmacy Med Name: LOSARTAN POTASSIUM 50 MG TAB] 30 tablet 0  ?  Sig: TAKE 1 TABLET BY MOUTH EVERY DAY  ?  ? Cardiovascular:  Angiotensin Receptor Blockers Failed - 05/11/2021 11:31 AM  ?  ?  Failed - Last BP in normal range  ?  BP Readings from Last 1 Encounters:  ?04/19/21 (!) 142/96  ?   ?  ?  Passed - Cr in normal range and within 180 days  ?  Creat  ?Date Value Ref Range Status  ?03/15/2021 0.63 0.50 - 0.97 mg/dL Final  ? ?Creatinine, Urine  ?Date Value Ref Range Status  ?02/19/2008 57.3 mg/dL Final  ?   ?  ?  Passed - K in normal range and within 180 days  ?  Potassium  ?Date Value Ref Range Status  ?03/15/2021 4.0 3.5 - 5.3 mmol/L Final  ?   ?  ?  Passed - Patient is not pregnant  ?  ?  Passed - Valid encounter within last 6 months  ?  Recent Outpatient Visits   ?      ? 3 weeks ago Primary hypertension  ? [facility] [place], [place] W, NP  ? 1  month ago Primary hypertension  ? [facility] [place], Salvadore Oxford, NP  ?  ?  ?Future Appointments   ?        ? In 5 days Baity, Salvadore Oxford, NP [facility], [facility]  ?  ? ?  ?  ?  ? ? ?

## 2021-05-17 ENCOUNTER — Ambulatory Visit (INDEPENDENT_AMBULATORY_CARE_PROVIDER_SITE_OTHER): Payer: 59 | Admitting: Internal Medicine

## 2021-05-17 ENCOUNTER — Encounter: Payer: Self-pay | Admitting: Internal Medicine

## 2021-05-17 ENCOUNTER — Other Ambulatory Visit: Payer: Self-pay

## 2021-05-17 DIAGNOSIS — I10 Essential (primary) hypertension: Secondary | ICD-10-CM

## 2021-05-17 MED ORDER — LOSARTAN POTASSIUM 50 MG PO TABS: 50.0000 mg | ORAL_TABLET | Freq: Every day | ORAL | 1 refills | Status: DC

## 2021-05-17 NOTE — Assessment & Plan Note (Signed)
Controlled on Losartan, refilled today ?Reinforced DASH diet and exercise for weight loss ?We will monitor ?

## 2021-05-17 NOTE — Patient Instructions (Signed)

## 2021-05-17 NOTE — Assessment & Plan Note (Signed)
Encourage diet and exercise for weight loss 

## 2021-05-17 NOTE — Progress Notes (Signed)
? ?Subjective:  ? ? Patient ID: Tara Woodward, female    DOB: 04/12/1990, 31 y.o.   MRN: [id number] ? ?HPI ? ?Patient presents to clinic today for 1 month follow-up of HTN.  At her last visit, her Losartan was increased to 50 mg daily.  She has been taking the medication as prescribed.  Her BP today is 123/89.  ECG from 02/2020 reviewed. ? ?Review of Systems ? ?   ?Past Medical History:  ?Diagnosis Date  ? Depression   ? HTN (hypertension)   ? Hyperthyroidism   ? Morbid obesity with BMI of 45.0-49.9, adult (HCC)   ? ? ?Current Outpatient Medications  ?Medication Sig Dispense Refill  ? acetaminophen (TYLENOL) 500 MG tablet Take 1,000 mg by mouth every 6 (six) hours as needed for moderate pain.    ? atorvastatin (LIPITOR) 10 MG tablet Take 1 tablet (10 mg total) by mouth daily. 90 tablet 0  ? citalopram (CELEXA) 20 MG tablet Take 1 tablet (20 mg total) by mouth daily. 90 tablet 0  ? levothyroxine (SYNTHROID) 100 MCG tablet Take 1 tablet (100 mcg total) by mouth daily. 30 tablet 1  ? losartan (COZAAR) 50 MG tablet TAKE 1 TABLET BY MOUTH EVERY DAY 30 tablet 0  ? ?Current Facility-Administered Medications  ?Medication Dose Route Frequency Provider Last Rate Last Admin  ? levonorgestrel (MIRENA) 20 MCG/24HR IUD 1 each  1 Intra Uterine Device Intrauterine Once Anyanwu, Jethro Bastos, MD      ? ? ?No Known Allergies ? ?Family History  ?Problem Relation Age of Onset  ? Depression Mother   ? Bipolar disorder Mother   ? Bipolar disorder Father   ? Diabetes Maternal Aunt   ? Cancer Maternal Aunt   ?     breast  ? Hypertension Maternal Aunt   ? Hypertension Maternal Grandmother   ? Diabetes Maternal Grandfather   ? Cancer Maternal Grandfather   ?     lung ca  ? ? ?Social History  ? ?Socioeconomic History  ? Marital status: Married  ?  Spouse name: Not on file  ? Number of children: Not on file  ? Years of education: Not on file  ? Highest education level: Not on file  ?Occupational History  ? Not on file  ?Tobacco Use  ? Smoking  status: Never  ? Smokeless tobacco: Never  ?Vaping Use  ? Vaping Use: Never used  ?Substance and Sexual Activity  ? Alcohol use: No  ? Drug use: No  ? Sexual activity: Yes  ?  Partners: Male  ?  Birth control/protection: I.U.D.  ?Other Topics Concern  ? Not on file  ?Social History Narrative  ? Not on file  ? ?Social Determinants of Health  ? ?Financial Resource Strain: Not on file  ?Food Insecurity: Not on file  ?Transportation Needs: Not on file  ?Physical Activity: Not on file  ?Stress: Not on file  ?Social Connections: Not on file  ?Intimate Partner Violence: Not on file  ? ? ? ?Constitutional: Denies fever, malaise, fatigue, headache or abrupt weight changes.  ?Respiratory: Denies difficulty breathing, shortness of breath, cough or sputum production.   ?Cardiovascular: Denies chest pain, chest tightness, palpitations or swelling in the hands or feet.  ?Neurological: Denies dizziness, difficulty with memory, difficulty with speech or problems with balance and coordination.  ? ? ?No other specific complaints in a complete review of systems (except as listed in HPI above). ? ?Objective:  ? Physical Exam ?BP 123/89 (BP Location:  Right Arm, Patient Position: Sitting, Cuff Size: Large)   Pulse 73   Temp (!) 97.1 ?F (36.2 ?C) (Temporal)   Wt 291 lb (132 kg)   SpO2 100%   BMI 48.42 kg/m?  ? ?Wt Readings from Last 3 Encounters:  ?04/19/21 292 lb (132.5 kg)  ?03/15/21 295 lb (133.8 kg)  ?03/05/20 257 lb (116.6 kg)  ? ? ?General: Appears her stated age, obese, in NAD. ?Cardiovascular: Normal rate and rhythm. S1,S2 noted.  No murmur, rubs or gallops noted.  ?Pulmonary/Chest: Normal effort and positive vesicular breath sounds. No respiratory distress. No wheezes, rales or ronchi noted.  ?Neurological: Alert and oriented. Coordination normal.  ? ? ?BMET ?   ?Component Value Date/Time  ? NA 136 03/15/2021 1034  ? K 4.0 03/15/2021 1034  ? CL 101 03/15/2021 1034  ? CO2 23 03/15/2021 1034  ? GLUCOSE 72 03/15/2021 1034  ?  BUN 11 03/15/2021 1034  ? CREATININE 0.63 03/15/2021 1034  ? CALCIUM 9.2 03/15/2021 1034  ? GFRNONAA >60 03/05/2020 1542  ? GFRAA >60 12/09/2019 0551  ? ? ?Lipid Panel  ?   ?Component Value Date/Time  ? CHOL 306 (H) 03/15/2021 1034  ? TRIG 331 (H) 03/15/2021 1034  ? HDL 51 03/15/2021 1034  ? CHOLHDL 6.0 (H) 03/15/2021 1034  ? LDLCALC 199 (H) 03/15/2021 1034  ? ? ?CBC ?   ?Component Value Date/Time  ? WBC 7.6 03/15/2021 1034  ? RBC 4.70 03/15/2021 1034  ? HGB 13.8 03/15/2021 1034  ? HCT 40.0 03/15/2021 1034  ? PLT 395 03/15/2021 1034  ? MCV 85.1 03/15/2021 1034  ? MCH 29.4 03/15/2021 1034  ? MCHC 34.5 03/15/2021 1034  ? RDW 13.9 03/15/2021 1034  ? LYMPHSABS 2.2 03/05/2020 1542  ? MONOABS 0.5 03/05/2020 1542  ? EOSABS 0.0 03/05/2020 1542  ? BASOSABS 0.0 03/05/2020 1542  ? ? ?Hgb A1C ?Lab Results  ?Component Value Date  ? HGBA1C 4.9 03/15/2021  ? ? ? ? ? ? ? ?   ?Assessment & Plan:  ? ? ? ?Nicki Reaper, NP ?This visit occurred during the SARS-CoV-2 public health emergency.  Safety protocols were in place, including screening questions prior to the visit, additional usage of staff PPE, and extensive cleaning of exam room while observing appropriate contact time as indicated for disinfecting solutions.  ? ?

## 2021-06-02 ENCOUNTER — Other Ambulatory Visit: Payer: Self-pay | Admitting: Internal Medicine

## 2021-06-02 NOTE — Telephone Encounter (Signed)
Requested Prescriptions  ?Pending Prescriptions Disp Refills  ?? levothyroxine (SYNTHROID) 100 MCG tablet [Pharmacy Med Name: LEVOTHYROXINE 100 MCG TABLET] 30 tablet 1  ?  Sig: TAKE 1 TABLET BY MOUTH EVERY DAY  ?  ? Endocrinology:  Hypothyroid Agents Failed - 06/02/2021  9:12 AM  ?  ?  Failed - TSH in normal range and within 360 days  ?  TSH  ?Date Value Ref Range Status  ?03/15/2021 >150.00 (H) mIU/L Final  ?  Comment:  ?            Reference Range ?. ?          > or = 20 Years  0.40-4.50 ?. ?               Pregnancy Ranges ?          First trimester    0.26-2.66 ?          Second trimester   0.55-2.73 ?          Third trimester    0.43-2.91 ?  ?   ?  ?  Passed - Valid encounter within last 12 months  ?  Recent Outpatient Visits   ?      ? 2 weeks ago Primary hypertension  ? [facility] [place], [place] W, NP  ? 1 month ago Primary hypertension  ? [facility] [place], [place] W, NP  ? 2 months ago Primary hypertension  ? [facility] [place], Coralie Keens, NP  ?  ?  ?Future Appointments   ?        ? In 3 months Baity, Coralie Keens, NP [place]  ?  ? ?  ?  ?  ?? citalopram (CELEXA) 20 MG tablet [Pharmacy Med Name: CITALOPRAM HBR 20 MG TABLET] 90 tablet 1  ?  Sig: TAKE 1 TABLET BY MOUTH EVERY DAY  ?  ? Psychiatry:  Antidepressants - SSRI Passed - 06/02/2021  9:12 AM  ?  ?  Passed - Completed PHQ-2 or PHQ-9 in the last 360 days  ?  ?  Passed - Valid encounter within last 6 months  ?  Recent Outpatient Visits   ?      ? 2 weeks ago Primary hypertension  ? [facility] [place], [place] W, NP  ? 1 month ago Primary hypertension  ? [facility] [place], [place] W, NP  ? 2 months ago Primary hypertension  ? [facility] [place], Coralie Keens, NP  ?  ?  ?Future Appointments   ?        ? In 3 months Baity, Coralie Keens, NP [facility], [place]  ?  ? ?  ?  ?  ? ?

## 2021-06-02 NOTE — Telephone Encounter (Signed)
Requested medications are due for refill today.  yes ? ?Requested medications are on the active medications list.  yes ? ?Last refill. 04/19/2021 #30 1 refill ? ?Future visit scheduled.   yes ? ?Notes to clinic.  Refill failed protocol d/t abnormal labs. ? ? ? ?Requested Prescriptions  ?Pending Prescriptions Disp Refills  ? levothyroxine (SYNTHROID) 100 MCG tablet [Pharmacy Med Name: LEVOTHYROXINE 100 MCG TABLET] 30 tablet 1  ?  Sig: TAKE 1 TABLET BY MOUTH EVERY DAY  ?  ? Endocrinology:  Hypothyroid Agents Failed - 06/02/2021  9:12 AM  ?  ?  Failed - TSH in normal range and within 360 days  ?  TSH  ?Date Value Ref Range Status  ?03/15/2021 >150.00 (H) mIU/L Final  ?  Comment:  ?            Reference Range ?. ?          > or = 20 Years  0.40-4.50 ?. ?               Pregnancy Ranges ?          First trimester    0.26-2.66 ?          Second trimester   0.55-2.73 ?          Third trimester    0.43-2.91 ?  ?  ?  ?  ?  Passed - Valid encounter within last 12 months  ?  Recent Outpatient Visits   ? ?      ? 2 weeks ago Primary hypertension  ? [facility] [place], [place] W, NP  ? 1 month ago Primary hypertension  ? [facility] [place], [place] W, NP  ? 2 months ago Primary hypertension  ? [facility] [place], Salvadore Oxford, NP  ? ?  ?  ?Future Appointments   ? ?        ? In 3 months Baity, Salvadore Oxford, NP [facility]  ? ?  ? ?  ?  ?  ?Signed Prescriptions Disp Refills  ? citalopram (CELEXA) 20 MG tablet 90 tablet 1  ?  Sig: TAKE 1 TABLET BY MOUTH EVERY DAY  ?  ? Psychiatry:  Antidepressants - SSRI Passed - 06/02/2021  9:12 AM  ?  ?  Passed - Completed PHQ-2 or PHQ-9 in the last 360 days  ?  ?  Passed - Valid encounter within last 6 months  ?  Recent Outpatient Visits   ? ?      ? 2 weeks ago Primary hypertension  ? [facility] [place], [place] W, NP  ? 1 month ago Primary hypertension  ? [facility] [place], [place] W, NP  ? 2 months ago Primary  hypertension  ? [place], Salvadore Oxford, NP  ? ?  ?  ?Future Appointments   ? ?        ? In 3 months Baity, Salvadore Oxford, NP [facility], [facility]  ? ?  ? ?  ?  ?  ?  ?

## 2021-06-03 ENCOUNTER — Telehealth: Payer: Self-pay | Admitting: Internal Medicine

## 2021-06-03 DIAGNOSIS — F330 Major depressive disorder, recurrent, mild: Secondary | ICD-10-CM

## 2021-06-03 NOTE — Addendum Note (Signed)
Addended by: Jearld Fenton on: 06/03/2021 10:29 PM ? ? Modules accepted: Orders ? ?

## 2021-06-03 NOTE — Telephone Encounter (Signed)
Referral placed.

## 2021-06-03 NOTE — Telephone Encounter (Signed)
Copied from CRM [id number]. Topic: Referral - Request for Referral ?>> Jun 03, 2021  1:18 PM Jaquita Rector A wrote: ?Has patient seen PCP for this complaint? Yes.   ?*If NO, is insurance requiring patient see PCP for this issue before PCP can refer them? ?Referral for which specialty: Behavioral Specialist  ?Preferred provider/office: none chosen  ?Reason for referral: Depression issues ?

## 2021-06-08 NOTE — Telephone Encounter (Signed)
Pt called  to follow up on referral / please advise pt where referral was placed  ?

## 2021-06-15 ENCOUNTER — Other Ambulatory Visit (HOSPITAL_COMMUNITY)
Admission: RE | Admit: 2021-06-15 | Discharge: 2021-06-15 | Disposition: A | Discharge status: Home / Self Care | Payer: Medicaid Other | Source: Ambulatory Visit | Attending: Family Medicine | Admitting: Family Medicine

## 2021-06-15 ENCOUNTER — Encounter: Payer: Self-pay | Admitting: Obstetrics and Gynecology

## 2021-06-15 ENCOUNTER — Telehealth: Payer: Self-pay | Admitting: Internal Medicine

## 2021-06-15 ENCOUNTER — Encounter: Payer: Medicaid Other | Admitting: Family Medicine

## 2021-06-15 ENCOUNTER — Ambulatory Visit (INDEPENDENT_AMBULATORY_CARE_PROVIDER_SITE_OTHER): Payer: Medicaid Other | Admitting: Obstetrics and Gynecology

## 2021-06-15 VITALS — BP 160/96 | HR 101 | Ht 65.0 in | Wt 296.0 lb

## 2021-06-15 DIAGNOSIS — A630 Anogenital (venereal) warts: Secondary | ICD-10-CM | POA: Diagnosis not present

## 2021-06-15 DIAGNOSIS — N911 Secondary amenorrhea: Secondary | ICD-10-CM

## 2021-06-15 DIAGNOSIS — E039 Hypothyroidism, unspecified: Secondary | ICD-10-CM

## 2021-06-15 DIAGNOSIS — I10 Essential (primary) hypertension: Secondary | ICD-10-CM

## 2021-06-15 DIAGNOSIS — Z30431 Encounter for routine checking of intrauterine contraceptive device: Secondary | ICD-10-CM

## 2021-06-15 DIAGNOSIS — Z6841 Body Mass Index (BMI) 40.0 and over, adult: Secondary | ICD-10-CM

## 2021-06-15 DIAGNOSIS — Z01419 Encounter for gynecological examination (general) (routine) without abnormal findings: Secondary | ICD-10-CM | POA: Diagnosis not present

## 2021-06-15 DIAGNOSIS — Z124 Encounter for screening for malignant neoplasm of cervix: Secondary | ICD-10-CM | POA: Insufficient documentation

## 2021-06-15 HISTORY — DX: Anogenital (venereal) warts: A63.0

## 2021-06-15 MED ORDER — IMIQUIMOD 5 % EX CREA: TOPICAL_CREAM | CUTANEOUS | 0 refills | Status: AC

## 2021-06-15 NOTE — Progress Notes (Addendum)
Obstetrics and Gynecology ?New Patient Evaluation ? ?Appointment Date: 06/15/2021 ? ?OBGYN Clinic: [facility] ? ?Primary Care Provider: Lorre Munroe ? ?Chief Complaint:  ?Chief Complaint  ?Patient presents with  ? Contraception  ? ? ?History of Present Illness: Tara Woodward is a 31 y.o. Caucasian [id number] (No LMP recorded. (Menstrual status: IUD).), seen for the above chief complaint. Her past medical history is significant for BMI 49, HTN, hypothyroidism, Mirena from 2014 in place, genital warts ? ?Patient would like to get a BTL. She has been amenorrheic since having the Mirena.  ? ?Review of Systems: Pertinent items noted in HPI and remainder of comprehensive ROS otherwise negative.  ? ? ?Patient Active Problem List  ? Diagnosis Date Noted  ? Genital warts 06/15/2021  ? HLD (hyperlipidemia) 04/20/2021  ? Morbid obesity (HCC) 03/15/2021  ? Acquired hypothyroidism 03/15/2021  ? Major depression, recurrent (HCC) 11/08/2016  ? HTN (hypertension) 01/24/2013  ? ? ? ?Past Medical History:  ?Past Medical History:  ?Diagnosis Date  ? Depression   ? HTN (hypertension)   ? Hypothyroidism   ? Morbid obesity with BMI of 45.0-49.9, adult (HCC)   ? ? ?Past Surgical History:  ?History reviewed. No pertinent surgical history. ? ?Past Obstetrical History:  ?OB History  ?Gravida Para Term Preterm AB Living  ?1 1 1  0 0 2  ?SAB IAB Ectopic Multiple Live Births  ?0 0 0 0 1  ?  ?# Outcome Date GA Lbr Len/2nd Weight Sex Delivery Anes PTL Lv  ?1 Term           ?  ?Obstetric Comments  ?Vag delivery x 2 (twins)  ? ? ?Past Gynecological History: As per HPI. ?Pap: 2019, negative ? ?Social History:  ?Social History  ? ?Socioeconomic History  ? Marital status: Married  ?  Spouse name: Not on file  ? Number of children: Not on file  ? Years of education: Not on file  ? Highest education level: Not on file  ?Occupational History  ? Not on file  ?Tobacco Use  ? Smoking status: Never  ? Smokeless tobacco: Never   ?Vaping Use  ? Vaping Use: Never used  ?Substance and Sexual Activity  ? Alcohol use: No  ? Drug use: No  ? Sexual activity: Yes  ?  Partners: Male  ?  Birth control/protection: I.U.D.  ?Other Topics Concern  ? Not on file  ?Social History Narrative  ? Not on file  ? ?Social Determinants of Health  ? ?Financial Resource Strain: Not on file  ?Food Insecurity: Not on file  ?Transportation Needs: Not on file  ?Physical Activity: Not on file  ?Stress: Not on file  ?Social Connections: Not on file  ?Intimate Partner Violence: Not on file  ? ? ?Family History:  ?Family History  ?Problem Relation Age of Onset  ? Depression Mother   ? Bipolar disorder Mother   ? Bipolar disorder Father   ? Diabetes Maternal Aunt   ? Cancer Maternal Aunt   ?     breast  ? Hypertension Maternal Aunt   ? Hypertension Maternal Grandmother   ? Diabetes Maternal Grandfather   ? Cancer Maternal Grandfather   ?     lung ca  ? ? ?Medications ?Tara Woodward had no medications administered during this visit. ?Current Outpatient Medications  ?Medication Sig Dispense Refill  ? acetaminophen (TYLENOL) 500 MG tablet Take 1,000 mg by mouth every 6 (six) hours as needed for moderate pain.    ?  atorvastatin (LIPITOR) 10 MG tablet Take 1 tablet (10 mg total) by mouth daily. 90 tablet 0  ? levothyroxine (SYNTHROID) 100 MCG tablet Take 1 tablet (100 mcg total) by mouth daily. 30 tablet 1  ? losartan (COZAAR) 50 MG tablet Take 1 tablet (50 mg total) by mouth daily. 90 tablet 1  ? ? ?Allergies ?Patient has no known allergies. ? ? ?Physical Exam:  ?BP (!) 160/96   Pulse (!) 101   Ht 5\' 5"  (1.651 m)   Wt 296 lb (134.3 kg)   BMI 49.26 kg/m?  Body mass index is 49.26 kg/m?. ?General appearance: Well nourished, well developed female in no acute distress.  ?Neck:  Supple, normal appearance, and no thyromegaly  ?Cardiovascular: normal s1 and s2.  No murmurs, rubs or gallops. ?Respiratory:  Clear to auscultation bilateral. Normal respiratory effort ?Abdomen:  positive bowel sounds and no masses, hernias; diffusely non tender to palpation, non distended ?Neuro/Psych:  Normal mood and affect.  ?Skin:  Warm and dry.  ?Lymphatic:  No inguinal lymphadenopathy.  ? ?Pelvic exam: is limited by body habitus ?EGBUS: within normal limits. Diffuse genital warts on bilateral vulva (1-3cm in size, and in clusters) and at the peri-anal area, too ?Vagina: within normal limits and with no blood or discharge in the vault ?Cervix: normal appearing cervix without tenderness, discharge or lesions. IUD strings 3-4cm ?Uterus:  nonenlarged and non tender ?Adnexa:  normal adnexa and no mass, fullness, tenderness ?Rectovaginal: deferred ? ?Laboratory: UPT negative ? ?Radiology: none ? ?Assessment: pt stable ? ?Plan:  ?1. Cervical cancer screening ?- Cytology - PAP ? ?2. Genital warts ?Never has had treatment before. Will try aldara MWF and see back in 2 months ? ?3. Well woman exam with routine gynecological exam ?Routine care ? ?4. BMI 45.0-49.9, adult (HCC) ?PCP follow up. Last seen January 2023 ? ?5. Primary hypertension ?See above. Patient has July PCP follow up ? ?6. Hypothyroidism, unspecified type ?See above ? ?7. Amenorrhea, secondary ?Likely due to Mirena. UPT negative ? ?8. Contraception ?Patient would like to get a BTL. D/w her re: various methods, r/b/a and surgical risks and post op recovery. She would like to do a bilateral salpingectomy. BTL papers signed today. Request sent for IUD removal and l/s bilateral salpingectomy. Pt do condoms in the interim.  ? ?RTC 44m to check genital warts ? ?3m MD ?Attending ?[facility] [employer]) ? ?

## 2021-06-15 NOTE — Progress Notes (Signed)
Would like to discuss getting a tubal and to switch IUD ?

## 2021-06-16 NOTE — Telephone Encounter (Signed)
Requested medications are due for refill today.  Provider to determine. ? ?Requested medications are on the active medications list.  yes ? ?Last refill. 04/19/2021 #30 1 refill ? ?Future visit scheduled.   yes ? ?Notes to clinic.  Failed protocol d/t abnormal labs. ? ? ? ?Requested Prescriptions  ?Pending Prescriptions Disp Refills  ? levothyroxine (SYNTHROID) 100 MCG tablet [Pharmacy Med Name: LEVOTHYROXINE 100 MCG TABLET] 30 tablet 1  ?  Sig: TAKE 1 TABLET BY MOUTH EVERY DAY  ?  ? Endocrinology:  Hypothyroid Agents Failed - 06/15/2021  5:16 PM  ?  ?  Failed - TSH in normal range and within 360 days  ?  TSH  ?Date Value Ref Range Status  ?03/15/2021 >150.00 (H) mIU/L Final  ?  Comment:  ?            Reference Range ?. ?          > or = 20 Years  0.40-4.50 ?. ?               Pregnancy Ranges ?          First trimester    0.26-2.66 ?          Second trimester   0.55-2.73 ?          Third trimester    0.43-2.91 ?  ?  ?  ?  ?  Passed - Valid encounter within last 12 months  ?  Recent Outpatient Visits   ? ?      ? 1 month ago Primary hypertension  ? [facility] [place], [place] W, NP  ? 1 month ago Primary hypertension  ? [facility] [place], [place] W, NP  ? 3 months ago Primary hypertension  ? [facility] [place], Salvadore Oxford, NP  ? ?  ?  ?Future Appointments   ? ?        ? In 3 months Baity, Salvadore Oxford, NP [facility], [facility]  ? ?  ? ?  ?  ?  ?  ?

## 2021-06-17 ENCOUNTER — Other Ambulatory Visit: Payer: Self-pay | Admitting: Internal Medicine

## 2021-06-17 DIAGNOSIS — E039 Hypothyroidism, unspecified: Secondary | ICD-10-CM

## 2021-06-17 MED ORDER — LEVOTHYROXINE SODIUM 100 MCG PO TABS: 100.0000 ug | ORAL_TABLET | Freq: Every day | ORAL | 0 refills | Status: DC

## 2021-06-17 NOTE — Telephone Encounter (Signed)
Patient states she's completely out of medication and would like PCP to expedite.  ?

## 2021-06-17 NOTE — Telephone Encounter (Signed)
Her last TSH was > 150. She never came back for her repeat lab. I will give her a 30 day supply but if she does not come get her labs done, I will not refill again ?

## 2021-06-17 NOTE — Telephone Encounter (Signed)
Pt advised.  Lab apt for 06/21/2021 ? ? ?Thanks,  ? ?-Vernona Rieger  ?

## 2021-06-20 LAB — CYTOLOGY - PAP
Comment: NEGATIVE
Diagnosis: NEGATIVE
High risk HPV: NEGATIVE

## 2021-06-21 ENCOUNTER — Other Ambulatory Visit: Payer: 59

## 2021-06-22 ENCOUNTER — Other Ambulatory Visit: Payer: 59

## 2021-06-22 DIAGNOSIS — E039 Hypothyroidism, unspecified: Secondary | ICD-10-CM

## 2021-06-23 LAB — TSH+FREE T4: TSH W/REFLEX TO FT4: 12.4 m[IU]/L — ABNORMAL HIGH

## 2021-06-23 LAB — T4, FREE: Free T4: 0.8 ng/dL (ref 0.8–1.8)

## 2021-06-24 ENCOUNTER — Other Ambulatory Visit: Payer: Self-pay

## 2021-06-24 MED ORDER — LEVOTHYROXINE SODIUM 125 MCG PO TABS: 125.0000 ug | ORAL_TABLET | Freq: Every day | ORAL | 0 refills | Status: DC

## 2021-07-09 ENCOUNTER — Other Ambulatory Visit: Payer: Self-pay | Admitting: Internal Medicine

## 2021-07-11 NOTE — Telephone Encounter (Signed)
Requested medication (s) are due for refill today: no ? ?Requested medication (s) are on the active medication list: no ? ?Last refill:  06/24/21 ? ?Future visit scheduled: yes ? ?Notes to clinic:Unable to refill per protocol, Rx expired. Medication was discontinued 06/24/21, dosage change. ? ? ?  ?Requested Prescriptions  ?Pending Prescriptions Disp Refills  ? levothyroxine (SYNTHROID) 100 MCG tablet [Pharmacy Med Name: LEVOTHYROXINE 100 MCG TABLET] 30 tablet 0  ?  Sig: TAKE 1 TABLET BY MOUTH EVERY DAY  ?  ? Endocrinology:  Hypothyroid Agents Failed - 07/09/2021 12:32 PM  ?  ?  Failed - TSH in normal range and within 360 days  ?  TSH  ?Date Value Ref Range Status  ?03/15/2021 >150.00 (H) mIU/L Final  ?  Comment:  ?            Reference Range ?. ?          > or = 20 Years  0.40-4.50 ?. ?               Pregnancy Ranges ?          First trimester    0.26-2.66 ?          Second trimester   0.55-2.73 ?          Third trimester    0.43-2.91 ?  ?  ?  ?  ?  Passed - Valid encounter within last 12 months  ?  Recent Outpatient Visits   ? ?      ? 1 month ago Primary hypertension  ? [facility] [place], [place] W, NP  ? 2 months ago Primary hypertension  ? [facility] [place], [place] W, NP  ? 3 months ago Primary hypertension  ? [facility] [place], Salvadore Oxford, NP  ? ?  ?  ?Future Appointments   ? ?        ? In 3 weeks Baity, Salvadore Oxford, NP [facility], [facility]  ? In 2 months Baity, Salvadore Oxford, NP [facility], [facility]  ? ?  ? ? ?  ?  ?  ? ? ?

## 2021-07-14 ENCOUNTER — Other Ambulatory Visit: Payer: Self-pay | Admitting: Internal Medicine

## 2021-07-14 NOTE — Telephone Encounter (Signed)
Requested Prescriptions  ?Pending Prescriptions Disp Refills  ?? atorvastatin (LIPITOR) 10 MG tablet [Pharmacy Med Name: ATORVASTATIN 10 MG TABLET] 90 tablet 0  ?  Sig: TAKE 1 TABLET BY MOUTH EVERY DAY  ?  ? Cardiovascular:  Antilipid - Statins Failed - 07/14/2021 11:20 AM  ?  ?  Failed - Lipid Panel in normal range within the last 12 months  ?  Cholesterol  ?Date Value Ref Range Status  ?03/15/2021 306 (H) <200 mg/dL Final  ? ?LDL Cholesterol (Calc)  ?Date Value Ref Range Status  ?03/15/2021 199 (H) mg/dL (calc) Final  ?  Comment:  ?  LDL-C levels > or = 190 mg/dL may indicate familial  ?hypercholesterolemia (FH). Clinical assessment and  ?measurement of blood lipid levels should be  ?considered for all first degree relatives of  ?patients with an FH diagnosis.  ?For questions about testing for familial ?hypercholesterolemia, please call [facility] ?Client Services at [email or web address]. ?Wardell Honour, et al. J National Lipid Association  ?Recommendations for Patient-Centered Management of  ?Dyslipidemia: Part 1 Journal of Clinical Lipidology  ?2015;9(2), 129-169. ?Reference range: <100 ?Marland Kitchen ?Desirable range <100 mg/dL for primary prevention;   ?<70 mg/dL for patients with CHD or diabetic patients  ?with > or = 2 CHD risk factors. ?. ?LDL-C is now calculated using the Martin-Hopkins  ?calculation, which is a validated novel method providing  ?better accuracy than the Friedewald equation in the  ?estimation of LDL-C.  ?Horald Pollen et al. Lenox Ahr. 5498;264(15): 2061-2068  ?(http://education.[facility].com/faq/FAQ164) ?  ? ?HDL  ?Date Value Ref Range Status  ?03/15/2021 51 > OR = 50 mg/dL Final  ? ?Triglycerides  ?Date Value Ref Range Status  ?03/15/2021 331 (H) <150 mg/dL Final  ?  Comment:  ?  . ?If a non-fasting specimen was collected, consider ?repeat triglyceride testing on a fasting specimen ?if clinically indicated.  ?Henrene Pastor al. J. of Clin. Lipidol. 2015;9:129-169. ?. ?  ? ?  ?  ?  Passed - Patient is not  pregnant  ?  ?  Passed - Valid encounter within last 12 months  ?  Recent Outpatient Visits   ?      ? 1 month ago Primary hypertension  ? [facility] [place], [place] W, NP  ? 2 months ago Primary hypertension  ? [facility] [place], [place] W, NP  ? 4 months ago Primary hypertension  ? [facility] [place], Salvadore Oxford, NP  ?  ?  ?Future Appointments   ?        ? In 2 weeks Baity, Salvadore Oxford, NP [facility], [facility]  ? In 2 months Baity, Salvadore Oxford, NP [facility], [facility]  ?  ? ?  ?  ?  ?Refused Prescriptions Disp Refills  ?? losartan (COZAAR) 25 MG tablet [Pharmacy Med Name: LOSARTAN POTASSIUM 25 MG TAB] 30 tablet 0  ?  Sig: TAKE 1 TABLET (25 MG TOTAL) BY MOUTH DAILY.  ?  ? Cardiovascular:  Angiotensin Receptor Blockers Failed - 07/14/2021 11:20 AM  ?  ?  Failed - Last BP in normal range  ?  BP Readings from Last 1 Encounters:  ?06/15/21 (!) 160/96  ?   ?  ?  Passed - Cr in normal range and within 180 days  ?  Creat  ?Date Value Ref Range Status  ?03/15/2021 0.63 0.50 - 0.97 mg/dL Final  ? ?Creatinine, Urine  ?Date Value Ref Range Status  ?02/19/2008 57.3 mg/dL Final  ?   ?  ?  Passed - K in normal range and within 180 days  ?  Potassium  ?Date Value Ref Range Status  ?03/15/2021 4.0 3.5 - 5.3 mmol/L Final  ?   ?  ?  Passed - Patient is not pregnant  ?  ?  Passed - Valid encounter within last 6 months  ?  Recent Outpatient Visits   ?      ? 1 month ago Primary hypertension  ? [facility] [place], [place] W, NP  ? 2 months ago Primary hypertension  ? [facility] [place], [place] W, NP  ? 4 months ago Primary hypertension  ? [facility] [place], Salvadore Oxford, NP  ?  ?  ?Future Appointments   ?        ? In 2 weeks Baity, Salvadore Oxford, NP [facility], [facility]  ? In 2 months Baity, Salvadore Oxford, NP [facility], [facility]  ?  ? ?  ?  ?  ? ?

## 2021-07-17 ENCOUNTER — Other Ambulatory Visit: Payer: Self-pay | Admitting: Internal Medicine

## 2021-07-19 NOTE — Telephone Encounter (Signed)
Patient no longer on this dose ?Requested Prescriptions  ?Pending Prescriptions Disp Refills  ?? levothyroxine (SYNTHROID) 125 MCG tablet [Pharmacy Med Name: LEVOTHYROXINE 125 MCG TABLET] 30 tablet 0  ?  Sig: TAKE 1 TABLET BY MOUTH EVERY DAY  ?  ? Endocrinology:  Hypothyroid Agents Failed - 07/17/2021  2:32 PM  ?  ?  Failed - TSH in normal range and within 360 days  ?  TSH  ?Date Value Ref Range Status  ?03/15/2021 >150.00 (H) mIU/L Final  ?  Comment:  ?            Reference Range ?. ?          > or = 20 Years  0.40-4.50 ?. ?               Pregnancy Ranges ?          First trimester    0.26-2.66 ?          Second trimester   0.55-2.73 ?          Third trimester    0.43-2.91 ?  ?   ?  ?  Passed - Valid encounter within last 12 months  ?  Recent Outpatient Visits   ?      ? 2 months ago Primary hypertension  ? [facility] [place], [place] W, NP  ? 3 months ago Primary hypertension  ? [facility] [place], [place] W, NP  ? 4 months ago Primary hypertension  ? [facility] [place], Salvadore Oxford, NP  ?  ?  ?Future Appointments   ?        ? In 2 weeks Baity, Salvadore Oxford, NP [facility], [facility]  ? In 2 months Baity, Salvadore Oxford, NP [facility], [facility]  ?  ? ?  ?  ?  ? ?

## 2021-07-21 ENCOUNTER — Other Ambulatory Visit: Payer: Self-pay | Admitting: Internal Medicine

## 2021-07-21 NOTE — Telephone Encounter (Signed)
Requested Prescriptions  Pending Prescriptions Disp Refills  . levothyroxine (SYNTHROID) 125 MCG tablet [Pharmacy Med Name: LEVOTHYROXINE 125 MCG TABLET] 30 tablet 0    Sig: TAKE 1 TABLET BY MOUTH EVERY DAY     Endocrinology:  Hypothyroid Agents Failed - 07/21/2021  9:33 AM      Failed - TSH in normal range and within 360 days    TSH  Date Value Ref Range Status  03/15/2021 >150.00 (H) mIU/L Final    Comment:              Reference Range .           > or = 20 Years  0.40-4.50 .                Pregnancy Ranges           First trimester    0.26-2.66           Second trimester   0.55-2.73           Third trimester    0.43-2.91          Passed - Valid encounter within last 12 months    Recent Outpatient Visits          2 months ago Primary hypertension   [facility] [place], Salvadore Oxford, NP   3 months ago Primary hypertension   [facility] [place], Salvadore Oxford, NP   4 months ago Primary hypertension   [facility] [place], Salvadore Oxford, NP      Future Appointments            In 1 week [name], Salvadore Oxford, NP [facility], [facility]   In 2 months [place], Salvadore Oxford, NP [facility], [facility]

## 2021-07-27 ENCOUNTER — Encounter (HOSPITAL_BASED_OUTPATIENT_CLINIC_OR_DEPARTMENT_OTHER): Payer: Self-pay

## 2021-07-27 ENCOUNTER — Encounter: Payer: Self-pay | Admitting: Family Medicine

## 2021-07-27 ENCOUNTER — Other Ambulatory Visit: Payer: Self-pay | Admitting: Family Medicine

## 2021-07-27 ENCOUNTER — Ambulatory Visit (HOSPITAL_BASED_OUTPATIENT_CLINIC_OR_DEPARTMENT_OTHER): Admit: 2021-07-27 | Payer: Medicaid Other | Admitting: Obstetrics and Gynecology

## 2021-07-27 ENCOUNTER — Ambulatory Visit (INDEPENDENT_AMBULATORY_CARE_PROVIDER_SITE_OTHER): Payer: Medicaid Other | Admitting: Family Medicine

## 2021-07-27 VITALS — BP 148/105 | HR 84 | Wt 290.0 lb

## 2021-07-27 DIAGNOSIS — A630 Anogenital (venereal) warts: Secondary | ICD-10-CM | POA: Diagnosis not present

## 2021-07-27 DIAGNOSIS — Z30432 Encounter for removal of intrauterine contraceptive device: Secondary | ICD-10-CM | POA: Diagnosis not present

## 2021-07-27 DIAGNOSIS — Z30011 Encounter for initial prescription of contraceptive pills: Secondary | ICD-10-CM

## 2021-07-27 SURGERY — SALPINGECTOMY, BILATERAL, LAPAROSCOPIC
Anesthesia: Choice

## 2021-07-27 MED ORDER — SLYND 4 MG PO TABS: 1.0000 | ORAL_TABLET | Freq: Every day | ORAL | 3 refills | Status: DC

## 2021-07-27 NOTE — Assessment & Plan Note (Signed)
Very large--to get Aldara and attempt--if not improved, would strongly recommend consideration of biopsy.

## 2021-07-27 NOTE — Progress Notes (Signed)
   Subjective:    Patient ID: Tara Woodward is a 31 y.o. female presenting with Contraception  on 07/27/2021  HPI: Here for IUD removal. Would like it out since she has no cycle for the last 14 years. Declines further IUD. Initially wanted BTL, but decided against this. Would like to have OCPs or Depo. Has poorly controlled BP and BMI > 40. Might want one more child. Has not started using the Aldara, as her insurance would not cover.  Review of Systems  Constitutional:  Negative for chills and fever.  Respiratory:  Negative for shortness of breath.   Cardiovascular:  Negative for chest pain.  Gastrointestinal:  Negative for abdominal pain, nausea and vomiting.  Genitourinary:  Negative for dysuria.  Skin:  Negative for rash.     Objective:    BP (!) 148/105   Pulse 84   Wt 290 lb (131.5 kg)   BMI 48.26 kg/m  Physical Exam Exam conducted with a chaperone present.  Constitutional:      General: She is not in acute distress.    Appearance: She is well-developed.  HENT:     Head: Normocephalic and atraumatic.  Eyes:     General: No scleral icterus. Cardiovascular:     Rate and Rhythm: Normal rate.  Pulmonary:     Effort: Pulmonary effort is normal.  Abdominal:     Palpations: Abdomen is soft.  Genitourinary:    Comments: Vulva with large verruca masses bilaterally, BUS normal, vagina is pink and rugated, cervix is parous without lesion  Musculoskeletal:     Cervical back: Neck supple.  Skin:    General: Skin is warm and dry.  Neurological:     Mental Status: She is alert and oriented to person, place, and time.   Procedure: Speculum placed inside vagina.  Cervix visualized.  Strings grasped with ring forceps.  IUD removed intact.      Assessment & Plan:   Problem List Items Addressed This Visit       Unprioritized   Genital warts    Very large--to get Aldara and attempt--if not improved, would strongly recommend consideration of biopsy.       Other Visit  Diagnoses     Oral contraception initiation    -  Primary   Slynd as POPs is safest for her--if wants pregnancy, please come in for optimization of meds and BP control prior to pursuing.   Relevant Medications   Drospirenone (SLYND) 4 MG TABS   Encounter for IUD removal       removed       Return if symptoms worsen or fail to improve.   Reva Bores, MD 07/27/2021 4:03 PM

## 2021-07-28 ENCOUNTER — Encounter: Payer: Self-pay | Admitting: Family Medicine

## 2021-08-02 ENCOUNTER — Ambulatory Visit (INDEPENDENT_AMBULATORY_CARE_PROVIDER_SITE_OTHER): Payer: 59 | Admitting: Internal Medicine

## 2021-08-02 ENCOUNTER — Encounter: Payer: Self-pay | Admitting: Internal Medicine

## 2021-08-02 VITALS — BP 122/84 | HR 92 | Temp 97.5°F | Ht 65.0 in | Wt 290.0 lb

## 2021-08-02 DIAGNOSIS — Z0001 Encounter for general adult medical examination with abnormal findings: Secondary | ICD-10-CM | POA: Diagnosis not present

## 2021-08-02 DIAGNOSIS — E039 Hypothyroidism, unspecified: Secondary | ICD-10-CM

## 2021-08-02 DIAGNOSIS — Z23 Encounter for immunization: Secondary | ICD-10-CM

## 2021-08-02 NOTE — Progress Notes (Signed)
Subjective:    Patient ID: Tara Woodward, female    DOB: [birth year], 31 y.o.   MRN: [id number]  HPI  Patient presents to clinic today for annual exam.  Flu: 03/2021 Tetanus: unsure COVID: Moderna x2 Pap smear: 06/2021 Dentist: as needed  Diet: She does eat meat. She consumes fruits and veggies. She does eat fried foods. She drinks mostly soda. Exercise: None  Review of Systems     Past Medical History:  Diagnosis Date   Depression    HTN (hypertension)    Hypothyroidism    Morbid obesity with BMI of 45.0-49.9, adult (HCC)     Current Outpatient Medications  Medication Sig Dispense Refill   acetaminophen (TYLENOL) 500 MG tablet Take 1,000 mg by mouth every 6 (six) hours as needed for moderate pain.     atorvastatin (LIPITOR) 10 MG tablet TAKE 1 TABLET BY MOUTH EVERY DAY 90 tablet 0   Drospirenone (SLYND) 4 MG TABS Take 1 tablet by mouth daily. 84 tablet 3   imiquimod (ALDARA) 5 % cream Apply topically 3 (three) times a week. 12 each 0   levothyroxine (SYNTHROID) 125 MCG tablet TAKE 1 TABLET BY MOUTH EVERY DAY 30 tablet 0   losartan (COZAAR) 50 MG tablet Take 1 tablet (50 mg total) by mouth daily. 90 tablet 1   Current Facility-Administered Medications  Medication Dose Route Frequency Provider Last Rate Last Admin   levonorgestrel (MIRENA) 20 MCG/24HR IUD 1 each  1 Intra Uterine Device Intrauterine Once Anyanwu, Ugonna A, MD        No Known Allergies  Family History  Problem Relation Age of Onset   Depression Mother    Bipolar disorder Mother    Bipolar disorder Father    Diabetes Maternal Aunt    Cancer Maternal Aunt        breast   Hypertension Maternal Aunt    Hypertension Maternal Grandmother    Diabetes Maternal Grandfather    Cancer Maternal Grandfather        lung ca    Social History   Socioeconomic History   Marital status: Married    Spouse name: Not on file   Number of children: Not on file   Years of education: Not on file   Highest  education level: Not on file  Occupational History   Not on file  Tobacco Use   Smoking status: Never   Smokeless tobacco: Never  Vaping Use   Vaping Use: Never used  Substance and Sexual Activity   Alcohol use: No   Drug use: No   Sexual activity: Yes    Partners: Male    Birth control/protection: I.U.D.  Other Topics Concern   Not on file  Social History Narrative   Not on file   Social Determinants of Health   Financial Resource Strain: Not on file  Food Insecurity: Not on file  Transportation Needs: Not on file  Physical Activity: Not on file  Stress: Not on file  Social Connections: Not on file  Intimate Partner Violence: Not on file     Constitutional: Denies fever, malaise, fatigue, headache or abrupt weight changes.  HEENT: Denies eye pain, eye redness, ear pain, ringing in the ears, wax buildup, runny nose, nasal congestion, bloody nose, or sore throat. Respiratory: Denies difficulty breathing, shortness of breath, cough or sputum production.   Cardiovascular: Denies chest pain, chest tightness, palpitations or swelling in the hands or feet.  Gastrointestinal: Pt reports intermittent reflux. Denies abdominal pain, bloating,  constipation, diarrhea or blood in the stool.  GU: Denies urgency, frequency, pain with urination, burning sensation, blood in urine, odor or discharge. Musculoskeletal: Pt reports intermittent left knee pain. Denies decrease in range of motion, difficulty with gait, muscle pain or joint swelling.  Skin: Denies redness, rashes, lesions or ulcercations.  Neurological: Denies dizziness, difficulty with memory, difficulty with speech or problems with balance and coordination.  Psych: Patient has a history of depression.  Denies anxiety, SI/HI.  No other specific complaints in a complete review of systems (except as listed in HPI above).  Objective:   Physical Exam  BP 122/84 (BP Location: Left Arm, Patient Position: Sitting, Cuff Size: Large)    Pulse 92   Temp (!) 97.5 F (36.4 C) (Temporal)   Ht $R'5\' 5"'pb$  (1.651 m)   Wt 290 lb (131.5 kg)   SpO2 99%   BMI 48.26 kg/m   Wt Readings from Last 3 Encounters:  07/27/21 290 lb (131.5 kg)  06/15/21 296 lb (134.3 kg)  05/17/21 291 lb (132 kg)    General: Appears her stated age, obese, in NAD. Skin: Warm, dry and intact.  HEENT: Head: normal shape and size; Eyes: sclera white, no icterus, conjunctiva pink, PERRLA and EOMs intact;  Neck:  Neck supple, trachea midline. No masses, lumps or thyromegaly present.  Cardiovascular: Normal rate and rhythm. S1,S2 noted.  No murmur, rubs or gallops noted. No JVD or BLE edema. Pulmonary/Chest: Normal effort and positive vesicular breath sounds. No respiratory distress. No wheezes, rales or ronchi noted.  Abdomen: Soft and nontender. Normal bowel sounds.  Musculoskeletal: Strength 5/5 BUE/BLE. No difficulty with gait.  Neurological: Alert and oriented. Cranial nerves II-XII grossly intact. Coordination normal.  Psychiatric: Mood and affect normal. Behavior is normal. Judgment and thought content normal.    BMET    Component Value Date/Time   NA 136 03/15/2021 1034   K 4.0 03/15/2021 1034   CL 101 03/15/2021 1034   CO2 23 03/15/2021 1034   GLUCOSE 72 03/15/2021 1034   BUN 11 03/15/2021 1034   CREATININE 0.63 03/15/2021 1034   CALCIUM 9.2 03/15/2021 1034   GFRNONAA >60 03/05/2020 1542   GFRAA >60 12/09/2019 0551    Lipid Panel     Component Value Date/Time   CHOL 306 (H) 03/15/2021 1034   TRIG 331 (H) 03/15/2021 1034   HDL 51 03/15/2021 1034   CHOLHDL 6.0 (H) 03/15/2021 1034   LDLCALC 199 (H) 03/15/2021 1034    CBC    Component Value Date/Time   WBC 7.6 03/15/2021 1034   RBC 4.70 03/15/2021 1034   HGB 13.8 03/15/2021 1034   HCT 40.0 03/15/2021 1034   PLT 395 03/15/2021 1034   MCV 85.1 03/15/2021 1034   MCH 29.4 03/15/2021 1034   MCHC 34.5 03/15/2021 1034   RDW 13.9 03/15/2021 1034   LYMPHSABS 2.2 03/05/2020 1542    MONOABS 0.5 03/05/2020 1542   EOSABS 0.0 03/05/2020 1542   BASOSABS 0.0 03/05/2020 1542    Hgb A1C Lab Results  Component Value Date   HGBA1C 4.9 03/15/2021           Assessment & Plan:   Preventative Health Maintenance:  Encouraged her to get a flu shot in the fall Tetanus today Encouraged her to get her COVID booster Pap smear UTD Encouraged her to consume a balanced diet and exercise regimen Advised her to see a dentist annually We will check CBC, c-Met, TSH, free T4, lipid, A1c today  RTC in 6  months, follow-up chronic conditions Webb Silversmith, NP

## 2021-08-02 NOTE — Assessment & Plan Note (Signed)
Encouraged diet and exercise for weight loss ?

## 2021-08-02 NOTE — Patient Instructions (Signed)

## 2021-08-03 LAB — COMPLETE METABOLIC PANEL WITH GFR
AG Ratio: 1.5 (calc) (ref 1.0–2.5)
ALT: 12 U/L (ref 6–29)
AST: 13 U/L (ref 10–30)
Albumin: 4.4 g/dL (ref 3.6–5.1)
Alkaline phosphatase (APISO): 88 U/L (ref 31–125)
BUN: 11 mg/dL (ref 7–25)
CO2: 23 mmol/L (ref 20–32)
Calcium: 9.3 mg/dL (ref 8.6–10.2)
Chloride: 103 mmol/L (ref 98–110)
Creat: 0.6 mg/dL (ref 0.50–0.97)
Globulin: 2.9 g/dL (calc) (ref 1.9–3.7)
Glucose, Bld: 92 mg/dL (ref 65–99)
Potassium: 4.2 mmol/L (ref 3.5–5.3)
Sodium: 137 mmol/L (ref 135–146)
Total Bilirubin: 0.4 mg/dL (ref 0.2–1.2)
Total Protein: 7.3 g/dL (ref 6.1–8.1)
eGFR: 122 mL/min/{1.73_m2} (ref >=60)

## 2021-08-03 LAB — LIPID PANEL
Cholesterol: 182 mg/dL (ref <200)
HDL: 50 mg/dL (ref >=50)
LDL Cholesterol (Calc): 87 mg/dL (calc)
Non-HDL Cholesterol (Calc): 132 mg/dL (calc) — ABNORMAL HIGH (ref <130)
Total CHOL/HDL Ratio: 3.6 (calc) (ref <5.0)
Triglycerides: 335 mg/dL — ABNORMAL HIGH (ref <150)

## 2021-08-03 LAB — CBC
HCT: 38.6 % (ref 35.0–45.0)
Hemoglobin: 13.4 g/dL (ref 11.7–15.5)
MCH: 29.6 pg (ref 27.0–33.0)
MCHC: 34.7 g/dL (ref 32.0–36.0)
MCV: 85.4 fL (ref 80.0–100.0)
MPV: 8.7 fL (ref 7.5–12.5)
Platelets: 504 10*3/uL — ABNORMAL HIGH (ref 140–400)
RBC: 4.52 10*6/uL (ref 3.80–5.10)
RDW: 11.8 % (ref 11.0–15.0)
WBC: 8.6 10*3/uL (ref 3.8–10.8)

## 2021-08-03 LAB — TSH: TSH: 1.98 mIU/L

## 2021-08-03 LAB — HEMOGLOBIN A1C
Hgb A1c MFr Bld: 4.9 % of total Hgb (ref <5.7)
Mean Plasma Glucose: 94 mg/dL
eAG (mmol/L): 5.2 mmol/L

## 2021-08-03 LAB — T4, FREE: Free T4: 1.1 ng/dL (ref 0.8–1.8)
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
## 2021-08-09 ENCOUNTER — Telehealth: Payer: Self-pay | Admitting: Psychiatry

## 2021-08-09 ENCOUNTER — Ambulatory Visit: Payer: Self-pay | Admitting: Psychiatry

## 2021-08-09 NOTE — Telephone Encounter (Addendum)
She came into the office this morning, and asked to switch to virtual visit.   Sent link for video visit through Epic. Patient did not sign in. Called the patient for appointment scheduled today. She states that there is some issues with Internet connection, and she was informed by the company that it will be fixed very soon. She agrees to try log in the link from the text message on her mobile. She did not log in (waited till 9:15). She did not answer the phone.  Left voice message to contact the office [phone]).
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
## 2021-08-10 ENCOUNTER — Ambulatory Visit: Payer: Self-pay | Admitting: Psychiatry

## 2021-08-10 ENCOUNTER — Telehealth: Payer: Self-pay | Admitting: Psychiatry

## 2021-08-10 NOTE — Telephone Encounter (Signed)
Sent link for video visit through [employer]. Patient did not sign in. Called the patient for appointment scheduled today. She states that she could not sign in. She agrees to schedule for in person visit.

## 2021-08-14 ENCOUNTER — Other Ambulatory Visit: Payer: Self-pay | Admitting: Internal Medicine

## 2021-08-16 NOTE — Telephone Encounter (Signed)
Requested Prescriptions  Pending Prescriptions Disp Refills  . levothyroxine (SYNTHROID) 125 MCG tablet [Pharmacy Med Name: LEVOTHYROXINE 125 MCG TABLET] 90 tablet 1    Sig: TAKE 1 TABLET BY MOUTH EVERY DAY     Endocrinology:  Hypothyroid Agents Passed - 08/14/2021  2:33 PM      Passed - TSH in normal range and within 360 days    TSH  Date Value Ref Range Status  08/02/2021 1.98 mIU/L Final    Comment:              Reference Range .           > or = 20 Years  0.40-4.50 .                Pregnancy Ranges           First trimester    0.26-2.66           Second trimester   0.55-2.73           Third trimester    0.43-2.91          Passed - Valid encounter within last 12 months    Recent Outpatient Visits          2 weeks ago Need for Tdap vaccination   [facility] [place], Coralie Keens, NP   3 months ago Primary hypertension   [place], NP   3 months ago Primary hypertension   [place], Coralie Keens, NP   5 months ago Primary hypertension   [facility] [place], Coralie Keens, NP      Future Appointments            In 1 month [place], Coralie Keens, NP [facility]

## 2021-08-17 ENCOUNTER — Ambulatory Visit: Payer: Self-pay

## 2021-08-17 NOTE — Telephone Encounter (Signed)
Disregard. Pt had looked at medication bottle incorrectly. It was the same medication, same dosage. No further triage needed.   Summary: questions about medication   Pt stated she was prescribed a medication for her thyroid and she needs to know if she is suppose to stop taking the previous thyroid medication. Pt requests call back to advise. Cb# [phone]

## 2021-09-08 ENCOUNTER — Emergency Department
Admission: EM | Admit: 2021-09-08 | Discharge: 2021-09-08 | Discharge status: Against Medical Advice | Payer: Medicaid Other

## 2021-09-08 NOTE — ED Notes (Signed)
No answer when called several times from lobby 

## 2021-09-13 ENCOUNTER — Emergency Department
Admission: EM | Admit: 2021-09-13 | Discharge: 2021-09-13 | Disposition: A | Discharge status: Other Acute Inpatient Hospital | Payer: 59 | Attending: Emergency Medicine | Admitting: Emergency Medicine

## 2021-09-13 ENCOUNTER — Encounter: Payer: Self-pay | Admitting: Psychiatry

## 2021-09-13 ENCOUNTER — Inpatient Hospital Stay
Admission: AD | Admit: 2021-09-13 | Discharge: 2021-09-15 | DRG: 885 | Disposition: A | Discharge status: Home / Self Care | Payer: 59 | Source: Intra-hospital | Attending: Psychiatry | Admitting: Psychiatry

## 2021-09-13 ENCOUNTER — Other Ambulatory Visit: Payer: Self-pay

## 2021-09-13 ENCOUNTER — Encounter: Payer: Self-pay | Admitting: Emergency Medicine

## 2021-09-13 DIAGNOSIS — F29 Unspecified psychosis not due to a substance or known physiological condition: Secondary | ICD-10-CM | POA: Diagnosis not present

## 2021-09-13 DIAGNOSIS — F339 Major depressive disorder, recurrent, unspecified: Secondary | ICD-10-CM | POA: Diagnosis not present

## 2021-09-13 DIAGNOSIS — F4010 Social phobia, unspecified: Secondary | ICD-10-CM | POA: Diagnosis present

## 2021-09-13 DIAGNOSIS — R45851 Suicidal ideations: Secondary | ICD-10-CM | POA: Insufficient documentation

## 2021-09-13 DIAGNOSIS — E039 Hypothyroidism, unspecified: Secondary | ICD-10-CM | POA: Diagnosis present

## 2021-09-13 DIAGNOSIS — F332 Major depressive disorder, recurrent severe without psychotic features: Principal | ICD-10-CM | POA: Diagnosis present

## 2021-09-13 DIAGNOSIS — Z818 Family history of other mental and behavioral disorders: Secondary | ICD-10-CM

## 2021-09-13 DIAGNOSIS — Z9152 Personal history of nonsuicidal self-harm: Secondary | ICD-10-CM | POA: Diagnosis not present

## 2021-09-13 DIAGNOSIS — Z20822 Contact with and (suspected) exposure to covid-19: Secondary | ICD-10-CM | POA: Insufficient documentation

## 2021-09-13 DIAGNOSIS — I10 Essential (primary) hypertension: Secondary | ICD-10-CM | POA: Diagnosis present

## 2021-09-13 DIAGNOSIS — Z046 Encounter for general psychiatric examination, requested by authority: Secondary | ICD-10-CM | POA: Diagnosis present

## 2021-09-13 DIAGNOSIS — E785 Hyperlipidemia, unspecified: Secondary | ICD-10-CM | POA: Diagnosis present

## 2021-09-13 DIAGNOSIS — F32A Depression, unspecified: Secondary | ICD-10-CM

## 2021-09-13 LAB — ETHANOL: Alcohol, Ethyl (B): <10 mg/dL (ref <10)

## 2021-09-13 LAB — CBC
HCT: 40.3 % (ref 36.0–46.0)
Hemoglobin: 13.6 g/dL (ref 12.0–15.0)
MCH: 28.2 pg (ref 26.0–34.0)
MCHC: 33.7 g/dL (ref 30.0–36.0)
MCV: 83.4 fL (ref 80.0–100.0)
Platelets: 448 10*3/uL — ABNORMAL HIGH (ref 150–400)
RBC: 4.83 MIL/uL (ref 3.87–5.11)
RDW: 12.6 % (ref 11.5–15.5)
WBC: 6.6 10*3/uL (ref 4.0–10.5)
nRBC: 0 % (ref 0.0–0.2)

## 2021-09-13 LAB — COMPREHENSIVE METABOLIC PANEL
ALT: 10 U/L (ref 0–44)
AST: 16 U/L (ref 15–41)
Albumin: 4.2 g/dL (ref 3.5–5.0)
Alkaline Phosphatase: 73 U/L (ref 38–126)
Anion gap: 6 (ref 5–15)
BUN: 10 mg/dL (ref 6–20)
CO2: 24 mmol/L (ref 22–32)
Calcium: 9.1 mg/dL (ref 8.9–10.3)
Chloride: 107 mmol/L (ref 98–111)
Creatinine, Ser: 0.66 mg/dL (ref 0.44–1.00)
GFR, Estimated: >60 mL/min (ref >60)
Glucose, Bld: 91 mg/dL (ref 70–99)
Potassium: 3.8 mmol/L (ref 3.5–5.1)
Sodium: 137 mmol/L (ref 135–145)
Total Bilirubin: 0.6 mg/dL (ref 0.3–1.2)
Total Protein: 8.1 g/dL (ref 6.5–8.1)

## 2021-09-13 LAB — URINE DRUG SCREEN, QUALITATIVE (ARMC ONLY)
Amphetamines, Ur Screen: NOT DETECTED
Barbiturates, Ur Screen: NOT DETECTED
Benzodiazepine, Ur Scrn: NOT DETECTED
Cannabinoid 50 Ng, Ur ~~LOC~~: NOT DETECTED
Cocaine Metabolite,Ur ~~LOC~~: NOT DETECTED
MDMA (Ecstasy)Ur Screen: NOT DETECTED
Methadone Scn, Ur: NOT DETECTED
Opiate, Ur Screen: NOT DETECTED
Phencyclidine (PCP) Ur S: NOT DETECTED
Tricyclic, Ur Screen: NOT DETECTED

## 2021-09-13 LAB — ACETAMINOPHEN LEVEL: Acetaminophen (Tylenol), Serum: <10 ug/mL — ABNORMAL LOW (ref 10–30)

## 2021-09-13 LAB — POC URINE PREG, ED: Preg Test, Ur: NEGATIVE

## 2021-09-13 LAB — RESP PANEL BY RT-PCR (FLU A&B, COVID) ARPGX2
Influenza A by PCR: NEGATIVE
Influenza B by PCR: NEGATIVE
SARS Coronavirus 2 by RT PCR: NEGATIVE

## 2021-09-13 LAB — SALICYLATE LEVEL: Salicylate Lvl: <7 mg/dL — ABNORMAL LOW (ref 7.0–30.0)

## 2021-09-13 MED ORDER — HYDROXYZINE HCL 50 MG PO TABS
50.0000 mg | ORAL_TABLET | Freq: Three times a day (TID) | ORAL | Status: DC | PRN
  Administered 2021-09-14 – 2021-09-15 (×2): 50 mg via ORAL
  Filled 2021-09-13 (×2): qty 1

## 2021-09-13 MED ORDER — ATORVASTATIN CALCIUM 20 MG PO TABS: 10.0000 mg | ORAL_TABLET | Freq: Every day | ORAL | Status: DC

## 2021-09-13 MED ORDER — ALUM & MAG HYDROXIDE-SIMETH 200-200-20 MG/5ML PO SUSP: 30.0000 mL | ORAL | Status: DC | PRN

## 2021-09-13 MED ORDER — TRAZODONE HCL 100 MG PO TABS: 100.0000 mg | ORAL_TABLET | Freq: Every evening | ORAL | Status: DC | PRN

## 2021-09-13 MED ORDER — FLUOXETINE HCL 20 MG PO CAPS
20.0000 mg | ORAL_CAPSULE | Freq: Every day | ORAL | Status: DC
  Administered 2021-09-14 – 2021-09-15 (×2): 20 mg via ORAL
  Filled 2021-09-13 (×2): qty 1

## 2021-09-13 MED ORDER — LEVOTHYROXINE SODIUM 50 MCG PO TABS
125.0000 ug | ORAL_TABLET | Freq: Every day | ORAL | Status: DC
  Administered 2021-09-14 – 2021-09-15 (×2): 125 ug via ORAL
  Filled 2021-09-13 (×2): qty 1

## 2021-09-13 MED ORDER — LEVOTHYROXINE SODIUM 75 MCG PO TABS: 125.0000 ug | ORAL_TABLET | Freq: Every day | ORAL | Status: DC

## 2021-09-13 MED ORDER — LOSARTAN POTASSIUM 50 MG PO TABS
50.0000 mg | ORAL_TABLET | Freq: Every day | ORAL | Status: DC
  Administered 2021-09-14 – 2021-09-15 (×2): 50 mg via ORAL
  Filled 2021-09-13 (×2): qty 1

## 2021-09-13 MED ORDER — MAGNESIUM HYDROXIDE 400 MG/5ML PO SUSP: 30.0000 mL | Freq: Every day | ORAL | Status: DC | PRN

## 2021-09-13 MED ORDER — HYDROXYZINE HCL 25 MG PO TABS
25.0000 mg | ORAL_TABLET | Freq: Three times a day (TID) | ORAL | Status: DC | PRN
  Administered 2021-09-13: 25 mg via ORAL
  Filled 2021-09-13: qty 1

## 2021-09-13 MED ORDER — LOSARTAN POTASSIUM 50 MG PO TABS
50.0000 mg | ORAL_TABLET | Freq: Every day | ORAL | Status: DC
  Administered 2021-09-13: 50 mg via ORAL
  Filled 2021-09-13: qty 1

## 2021-09-13 MED ORDER — ATORVASTATIN CALCIUM 20 MG PO TABS
10.0000 mg | ORAL_TABLET | Freq: Every day | ORAL | Status: DC
  Administered 2021-09-14 – 2021-09-15 (×2): 10 mg via ORAL
  Filled 2021-09-13 (×2): qty 1

## 2021-09-13 MED ORDER — FLUOXETINE HCL 20 MG PO CAPS
20.0000 mg | ORAL_CAPSULE | Freq: Every day | ORAL | Status: DC
  Administered 2021-09-13: 20 mg via ORAL
  Filled 2021-09-13: qty 1

## 2021-09-13 MED ORDER — HYDROXYZINE HCL 25 MG PO TABS: 25.0000 mg | ORAL_TABLET | Freq: Three times a day (TID) | ORAL | Status: DC | PRN

## 2021-09-13 MED ORDER — ACETAMINOPHEN 325 MG PO TABS: 650.0000 mg | ORAL_TABLET | Freq: Four times a day (QID) | ORAL | Status: DC | PRN

## 2021-09-13 NOTE — ED Notes (Signed)
Report to Florentina Addison, Charity fundraiser. Pt wheeled to BHU 4 via ED tech and security. Sent with all of belongings and paperwork.

## 2021-09-13 NOTE — ED Notes (Signed)
Hospital meal provided.  100% consumed, pt tolerated w/o complaints.  Waste discarded appropriately.   

## 2021-09-13 NOTE — ED Triage Notes (Signed)
Patient to ED via POV with mother for SI. Patient states she tried to choke herself because "she doesn't want to feel this way no more." Patient mother states hx of bipolar and that this "has been going on for a while."

## 2021-09-13 NOTE — ED Notes (Signed)
Used phone during phone hours to speak with mother.

## 2021-09-13 NOTE — ED Notes (Addendum)
Purse and cell phone sent home with mother.   Patient belongings:  1 Purple shirt 1 pajama pants 2 white sandals 1 underwear 1 nose ring

## 2021-09-13 NOTE — ED Notes (Signed)
Pt. Transferred to BHU , room# 4 from main ed .Patient was screened by security before entering the unit. Report to include Situation, Background, Assessment and Recommendations from Hillery Aldo, RN . Pt. Oriented to unit including Q15 minute rounds as well as the security cameras for their protection. Patient is alert and oriented, warm and dry in no acute distress.

## 2021-09-13 NOTE — BH Assessment (Signed)
Patient is scheduled to be admitted to [facility] tonight, if adequate staffing is available and if not, patient will be admitted on tomm 09/14/21.  

## 2021-09-13 NOTE — ED Notes (Signed)
[facility]  CONSULT  DONE  PENDING  PLACEMENT 

## 2021-09-13 NOTE — ED Provider Notes (Signed)
   [facility] Provider Note    Event Date/Time   First MD Initiated Contact with Patient 09/13/21 [phone]     (approximate)  History   Chief Complaint: Psychiatric Evaluation  HPI  Tara Woodward is a 31 y.o. female with a past medical history of depression, hypertension, hypothyroidism, obesity, presents to the emergency department for worsening depression and suicidal ideation/attempt.  According to the patient she has been experiencing worsening depression.  Patient denies any drug or alcohol use.  Patient states she attempted to choke herself this morning due to worsening suicidal thoughts.  Patient denies any medical complaints.  Physical Exam   Triage Vital Signs: ED Triage Vitals [09/13/21 0845]  Enc Vitals Group     BP (!) 157/105     Pulse Rate 95     Resp 18     Temp 98.2 F (36.8 C)     Temp Source Oral     SpO2 100 %     Weight 290 lb (131.5 kg)     Height 5\' 5"  (1.651 m)     Head Circumference      Peak Flow      Pain Score 0     Pain Loc      Pain Edu?      Excl. in GC?     Most recent vital signs: Vitals:   09/13/21 0845  BP: (!) 157/105  Pulse: 95  Resp: 18  Temp: 98.2 F (36.8 C)  SpO2: 100%    General: Awake, no distress.  CV:  Good peripheral perfusion.  Regular rate and rhythm  Resp:  Normal effort.  Equal breath sounds bilaterally.  Abd:  No distention.  Soft, nontender.  No rebound or guarding.   ED Results / Procedures / Treatments   MEDICATIONS ORDERED IN ED: Medications - No data to display   IMPRESSION / MDM / ASSESSMENT AND PLAN / ED COURSE  I reviewed the triage vital signs and the nursing notes.  Patient's presentation is most consistent with acute presentation with potential threat to life or bodily function.  Patient presents emergency department for worsening depression now with suicidal ideation/attempt.  Patient states she attempted to choke herself.  There is no signs of strangulation on physical  exam.  Reassuring physical exam.  We will check labs.  Given the patient's active suicidal thoughts and possible attempt we will place the patient under an IVC into the patient to be adequately evaluated by psychiatry.  Patient agreeable to plan.  Lab work shows a normal CBC with a normal white blood cell count.  Negative pregnancy test.  Negative urine drug screen.  Negative COVID.  Awaiting psych evaluation.  FINAL CLINICAL IMPRESSION(S) / ED DIAGNOSES   Suicidal ideation Depression  Note:  This document was prepared using Dragon voice recognition software and may include unintentional dictation errors.   11/14/21, MD 09/13/21 1515

## 2021-09-13 NOTE — ED Notes (Signed)
IVC PENDING  CONSULT ?

## 2021-09-13 NOTE — ED Notes (Signed)
Hospital meal provided, pt tolerated w/o complaints.  Waste discarded appropriately.  

## 2021-09-13 NOTE — BH Assessment (Addendum)
Patient is to be admitted to [facility] BMU either tonight 09/13/21 after 8:30pm or tomm 09/14/21 by Dr. Toni Amend.  Attending Physician will be Dr.  Toni Amend .   Patient has been assigned to room 320, by [facility] Charge Nurse Mason Dibiasio.    ER staff is aware of the admission: Misty Stanley, ER Secretary   Dr. Fuller Plan, ER MD  Florentina Addison, Patient's Nurse  Sterling Big, Patient Access.

## 2021-09-13 NOTE — ED Notes (Signed)

## 2021-09-13 NOTE — Progress Notes (Signed)
Patient arrived on unit with complaints of anxiety and depression. Patient reports that she has been diagnosed with depression but never took medication for it. Patient is alert and oriented. Denies pain. Denies having thoughts to harm herself or anyone else. Patient reports that recently her depression has become worse to the point that she does not want to get out of bed. Patient states that her goal while she is here to get on medication to stabilize her mood. Patient called her mother upon arrival to the unit.

## 2021-09-13 NOTE — Consult Note (Signed)
[facility] Face-to-Face Psychiatry Consult   Reason for Consult: Consult for 31 year old woman with a history of depression who came into the hospital with suicidal ideation Referring Physician: Alfred Levins Patient Identification: Tara Woodward MRN:  [id number] Principal Diagnosis: Major depression, recurrent (HCC) Diagnosis:  Principal Problem:   Major depression, recurrent (HCC) Active Problems:   HTN (hypertension)   Acquired hypothyroidism   Total Time spent with patient: 30 minutes  Subjective:   Tara Woodward is a 31 y.o. female patient admitted with "I have been really depressed".  HPI: Patient seen and chart reviewed.  Old notes from earlier today reviewed which is particularly important as the patient tried to abruptly change her story with me.  This is a 31 year old woman with a history of recurrent depression and anxiety reports that her mood has been very sad and depressed.  She feels down and negative and hopeless but also emphasizes that she gets anxious and panicky frequently.  She has not been able to hold a job regularly because of what sounds like serious social anxiety.  Today she tried to choke herself.  Family then insisted on her coming into the emergency room.  In earlier notes today the patient repeatedly said that she wanted to kill herself and even said that she thought she would if she went home.  Patient denies that she is drinking or abusing any drugs.  She claimed to me that she was currently taking psychiatric medicine.  I cannot find any evidence in the chart.  She missed 2 appointments with a psychiatrist in June and none of her regular doctor appointment notes mention anything about an antidepressant.  Past Psychiatric History: Past history of depression and self-injury chronic anxiety poor compliance with outpatient treatment.  Risk to Self:   Risk to Others:   Prior Inpatient Therapy:   Prior Outpatient Therapy:    Past Medical History:  Past Medical History:   Diagnosis Date   Depression    HTN (hypertension)    Hypothyroidism    Morbid obesity with BMI of 45.0-49.9, adult (HCC)    History reviewed. No pertinent surgical history. Family History:  Family History  Problem Relation Age of Onset   Depression Mother    Bipolar disorder Mother    Bipolar disorder Father    Healthy Sister    Hypertension Maternal Grandmother    Diabetes Maternal Grandfather    Lung cancer Maternal Grandfather    Diabetes Maternal Aunt    Breast cancer Maternal Aunt    Hypertension Maternal Aunt    Family Psychiatric  History: See previous Social History:  Social History   Substance and Sexual Activity  Alcohol Use No     Social History   Substance and Sexual Activity  Drug Use No    Social History   Socioeconomic History   Marital status: Married    Spouse name: Not on file   Number of children: Not on file   Years of education: Not on file   Highest education level: Not on file  Occupational History   Not on file  Tobacco Use   Smoking status: Never   Smokeless tobacco: Never  Vaping Use   Vaping Use: Never used  Substance and Sexual Activity   Alcohol use: No   Drug use: No   Sexual activity: Yes    Partners: Male    Birth control/protection: I.U.D.  Other Topics Concern   Not on file  Social History Narrative   Not on file  Social Determinants of Health   Financial Resource Strain: Not on file  Food Insecurity: Not on file  Transportation Needs: Not on file  Physical Activity: Not on file  Stress: Not on file  Social Connections: Not on file   Additional Social History:    Allergies:  No Known Allergies  Labs:  Results for orders placed or performed during the hospital encounter of 09/13/21 (from the past 48 hour(s))  Comprehensive metabolic panel     Status: None   Collection Time: 09/13/21  8:48 AM  Result Value Ref Range   Sodium 137 135 - 145 mmol/L   Potassium 3.8 3.5 - 5.1 mmol/L   Chloride 107 98 - 111  mmol/L   CO2 24 22 - 32 mmol/L   Glucose, Bld 91 70 - 99 mg/dL    Comment: Glucose reference range applies only to samples taken after fasting for at least 8 hours.   BUN 10 6 - 20 mg/dL   Creatinine, Ser 6.43 0.44 - 1.00 mg/dL   Calcium 9.1 8.9 - 32.9 mg/dL   Total Protein 8.1 6.5 - 8.1 g/dL   Albumin 4.2 3.5 - 5.0 g/dL   AST 16 15 - 41 U/L   ALT 10 0 - 44 U/L   Alkaline Phosphatase 73 38 - 126 U/L   Total Bilirubin 0.6 0.3 - 1.2 mg/dL   GFR, Estimated >51 >88 mL/min    Comment: (NOTE) Calculated using the CKD-EPI Creatinine Equation (2021)    Anion gap 6 5 - 15    Comment: Performed at [facility], [address] [place], [place] [address]  Salicylate level     Status: Abnormal   Collection Time: 09/13/21  8:48 AM  Result Value Ref Range   Salicylate Lvl <7.0 (L) 7.0 - 30.0 mg/dL    Comment: Performed at [facility], [address], [place], [place] [address]  Acetaminophen level     Status: Abnormal   Collection Time: 09/13/21  8:48 AM  Result Value Ref Range   Acetaminophen (Tylenol), Serum <10 (L) 10 - 30 ug/mL    Comment: (NOTE) Therapeutic concentrations vary significantly. A range of 10-30 ug/mL  may be an effective concentration for many patients. However, some  are best treated at concentrations outside of this range. Acetaminophen concentrations >150 ug/mL at 4 hours after ingestion  and >50 ug/mL at 12 hours after ingestion are often associated with  toxic reactions.  Performed at [facility], [address] [place], [place] [address]   cbc     Status: Abnormal   Collection Time: 09/13/21  8:48 AM  Result Value Ref Range   WBC 6.6 4.0 - 10.5 K/uL   RBC 4.83 3.87 - 5.11 MIL/uL   Hemoglobin 13.6 12.0 - 15.0 g/dL   HCT 23.5 57.3 - 22.0 %   MCV 83.4 80.0 - 100.0 fL   MCH 28.2 26.0 - 34.0 pg   MCHC 33.7 30.0 - 36.0 g/dL   RDW 25.4 27.0 - 62.3 %   Platelets 448 (H) 150 - 400 K/uL   nRBC 0.0 0.0 - 0.2 %    Comment: Performed  at [facility], [address] [place], [place] [address]  Ethanol     Status: None   Collection Time: 09/13/21  8:49 AM  Result Value Ref Range   Alcohol, Ethyl (B) <10 <10 mg/dL    Comment: (NOTE) Lowest detectable limit for serum alcohol is 10 mg/dL.  For medical purposes only.  Performed at [facility], [address] [place], [place] [address]   POC urine preg, ED     Status: None   Collection Time: 09/13/21  8:57 AM  Result Value Ref Range   Preg Test, Ur NEGATIVE NEGATIVE    Comment:        THE SENSITIVITY OF THIS METHODOLOGY IS >24 mIU/mL   Urine Drug Screen, Qualitative     Status: None   Collection Time: 09/13/21  8:58 AM  Result Value Ref Range   Tricyclic, Ur Screen NONE DETECTED NONE DETECTED   Amphetamines, Ur Screen NONE DETECTED NONE DETECTED   MDMA (Ecstasy)Ur Screen NONE DETECTED NONE DETECTED   Cocaine Metabolite,Ur [place] NONE DETECTED NONE DETECTED   Opiate, Ur Screen NONE DETECTED NONE DETECTED   Phencyclidine (PCP) Ur S NONE DETECTED NONE DETECTED   Cannabinoid 50 Ng, Ur [place] NONE DETECTED NONE DETECTED   Barbiturates, Ur Screen NONE DETECTED NONE DETECTED   Benzodiazepine, Ur Scrn NONE DETECTED NONE DETECTED   Methadone Scn, Ur NONE DETECTED NONE DETECTED    Comment: (NOTE) Tricyclics + metabolites, urine    Cutoff 1000 ng/mL Amphetamines + metabolites, urine  Cutoff 1000 ng/mL MDMA (Ecstasy), urine              Cutoff 500 ng/mL Cocaine Metabolite, urine          Cutoff 300 ng/mL Opiate + metabolites, urine        Cutoff 300 ng/mL Phencyclidine (PCP), urine         Cutoff 25 ng/mL Cannabinoid, urine                 Cutoff 50 ng/mL Barbiturates + metabolites, urine  Cutoff 200 ng/mL Benzodiazepine, urine              Cutoff 200 ng/mL Methadone, urine                   Cutoff 300 ng/mL  The urine drug screen provides only a preliminary, unconfirmed analytical test result and should not be used for non-medical purposes. Clinical  consideration and professional judgment should be applied to any positive drug screen result due to possible interfering substances. A more specific alternate chemical method must be used in order to obtain a confirmed analytical result. Gas chromatography / mass spectrometry (GC/MS) is the preferred confirm atory method. Performed at [facility], [address] [place], [place] [address]   Resp Panel by RT-PCR (Flu A&B, Covid) Anterior Nasal Swab     Status: None   Collection Time: 09/13/21  9:10 AM   Specimen: Anterior Nasal Swab  Result Value Ref Range   SARS Coronavirus 2 by RT PCR NEGATIVE NEGATIVE    Comment: (NOTE) SARS-CoV-2 target nucleic acids are NOT DETECTED.  The SARS-CoV-2 RNA is generally detectable in upper respiratory specimens during the acute phase of infection. The lowest concentration of SARS-CoV-2 viral copies this assay can detect is 138 copies/mL. A negative result does not preclude SARS-Cov-2 infection and should not be used as the sole basis for treatment or other patient management decisions. A negative result may occur with  improper specimen collection/handling, submission of specimen other than nasopharyngeal swab, presence of viral mutation(s) within the areas targeted by this assay, and inadequate number of viral copies(<138 copies/mL). A negative result must be combined with clinical observations, patient history, and epidemiological information. The expected result is Negative.  Fact Sheet for Patients:  BloggerCourse.comhttps://www.fda.gov/media/152166/download  Fact Sheet for Healthcare Providers:  SeriousBroker.it  This test is no t yet approved or cleared by the [place] and  has been authorized for detection and/or diagnosis of SARS-CoV-2 by FDA under an Emergency Use Authorization (EUA). This EUA will remain  in effect (meaning this test can be used) for the duration of the COVID-19 declaration under Section  564(b)(1) of the Act, 21 U.S.C.section 360bbb-3(b)(1), unless the authorization is terminated  or revoked sooner.       Influenza A by PCR NEGATIVE NEGATIVE   Influenza B by PCR NEGATIVE NEGATIVE    Comment: (NOTE) The Xpert Xpress SARS-CoV-2/FLU/RSV plus assay is intended as an aid in the diagnosis of influenza from Nasopharyngeal swab specimens and should not be used as a sole basis for treatment. Nasal washings and aspirates are unacceptable for Xpert Xpress SARS-CoV-2/FLU/RSV testing.  Fact Sheet for Patients: BloggerCourse.com  Fact Sheet for Healthcare Providers: SeriousBroker.it  This test is not yet approved or cleared by the [place] FDA and has been authorized for detection and/or diagnosis of SARS-CoV-2 by FDA under an Emergency Use Authorization (EUA). This EUA will remain in effect (meaning this test can be used) for the duration of the COVID-19 declaration under Section 564(b)(1) of the Act, 21 U.S.C. section 360bbb-3(b)(1), unless the authorization is terminated or revoked.  Performed at [facility], [address] [place], [place] [address]     Current Facility-Administered Medications  Medication Dose Route Frequency Provider Last Rate Last Admin   atorvastatin (LIPITOR) tablet 10 mg  10 mg Oral Daily Grey Rakestraw, Jackquline Denmark, MD       FLUoxetine (PROZAC) capsule 20 mg  20 mg Oral Daily Konnor Vondrasek, Jackquline Denmark, MD       hydrOXYzine (ATARAX) tablet 25 mg  25 mg Oral TID PRN Minna Antis, MD   25 mg at 09/13/21 1426   [START ON 09/14/2021] levothyroxine (SYNTHROID) tablet 125 mcg  125 mcg Oral Q0600 Charls Custer, Jackquline Denmark, MD       losartan (COZAAR) tablet 50 mg  50 mg Oral Daily Ivy Meriwether, Jackquline Denmark, MD       Current Outpatient Medications  Medication Sig Dispense Refill   acetaminophen (TYLENOL) 500 MG tablet Take 1,000 mg by mouth every 6 (six) hours as needed for moderate pain.     atorvastatin (LIPITOR) 10 MG  tablet TAKE 1 TABLET BY MOUTH EVERY DAY 90 tablet 0   Drospirenone (SLYND) 4 MG TABS Take 1 tablet by mouth daily. 84 tablet 3   levothyroxine (SYNTHROID) 125 MCG tablet TAKE 1 TABLET BY MOUTH EVERY DAY 90 tablet 1   losartan (COZAAR) 50 MG tablet Take 1 tablet (50 mg total) by mouth daily. 90 tablet 1    Musculoskeletal: Strength & Muscle Tone: within normal limits Gait & Station: normal Patient leans: N/A            Psychiatric Specialty Exam:  Presentation  General Appearance: No data recorded Eye Contact:No data recorded Speech:No data recorded Speech Volume:No data recorded Handedness:No data recorded  Mood and Affect  Mood:No data recorded Affect:No data recorded  Thought Process  Thought Processes:No data recorded Descriptions of Associations:No data recorded Orientation:No data recorded Thought Content:No data recorded History of Schizophrenia/Schizoaffective disorder:No  Duration of Psychotic Symptoms:No data recorded Hallucinations:No data recorded Ideas of Reference:No data recorded Suicidal Thoughts:No data recorded Homicidal Thoughts:No data recorded  Sensorium  Memory:No data recorded Judgment:No data recorded Insight:No data recorded  Executive Functions  Concentration:No data recorded Attention Span:No data recorded Recall:No data recorded [employer]  of Knowledge:No data recorded Language:No data recorded  Psychomotor Activity  Psychomotor Activity:No data recorded  Assets  Assets:No data recorded  Sleep  Sleep:No data recorded  Physical Exam: Physical Exam Vitals and nursing note reviewed.  Constitutional:      Appearance: Normal appearance.  HENT:     Head: Normocephalic and atraumatic.     Mouth/Throat:     Pharynx: Oropharynx is clear.  Eyes:     Pupils: Pupils are equal, round, and reactive to light.  Cardiovascular:     Rate and Rhythm: Normal rate and regular rhythm.  Pulmonary:     Effort: Pulmonary effort is normal.      Breath sounds: Normal breath sounds.  Abdominal:     General: Abdomen is flat.     Palpations: Abdomen is soft.  Musculoskeletal:        General: Normal range of motion.  Skin:    General: Skin is warm and dry.  Neurological:     General: No focal deficit present.     Mental Status: She is alert. Mental status is at baseline.  Psychiatric:        Attention and Perception: Attention normal.        Mood and Affect: Mood is anxious and depressed.        Speech: Speech normal.        Behavior: Behavior is cooperative.        Thought Content: Thought content includes suicidal ideation.        Cognition and Memory: Cognition normal.        Judgment: Judgment is impulsive.    Review of Systems  Constitutional: Negative.   HENT: Negative.    Eyes: Negative.   Respiratory: Negative.    Cardiovascular: Negative.   Gastrointestinal: Negative.   Musculoskeletal: Negative.   Skin: Negative.   Neurological: Negative.   Psychiatric/Behavioral:  Positive for depression and suicidal ideas. Negative for hallucinations and substance abuse. The patient is nervous/anxious.    Blood pressure (!) 157/105, pulse 95, temperature 98.2 F (36.8 C), temperature source Oral, resp. rate 18, height 5\' 5"  (1.651 m), weight 131.5 kg, SpO2 100 %. Body mass index is 48.26 kg/m.  Treatment Plan Summary: Medication management and Plan because of multiple documented reports of suicidal ideation as well as reports of trying to choke herself earlier today the patient meets commitment criteria and will be scheduled for admission to the psychiatric unit.  Recommend starting antidepressants with fluoxetine as that often is most effective in social anxiety disorder when doses are titrated up.  Restart antihypertensive and medicine for cholesterol.  Case reviewed with the ER physician  Disposition: Patient does not meet criteria for psychiatric inpatient admission.  , MD 09/13/2021 5:28 PM

## 2021-09-13 NOTE — ED Notes (Signed)
TTS at bedside. 

## 2021-09-13 NOTE — Plan of Care (Signed)
  Problem: Health Behavior/Discharge Planning: Goal: Ability to manage health-related needs will improve Outcome: Not Progressing   Problem: Coping: Goal: Level of anxiety will decrease Outcome: Not Progressing   Problem: Education: Goal: Emotional status will improve Outcome: Not Progressing Goal: Mental status will improve Outcome: Not Progressing   Problem: Self-Concept: Goal: Ability to identify factors that promote anxiety will improve Outcome: Not Progressing Goal: Level of anxiety will decrease Outcome: Not Progressing Goal: Ability to modify response to factors that promote anxiety will improve Outcome: Not Progressing

## 2021-09-13 NOTE — BH Assessment (Signed)
Comprehensive Clinical Assessment (CCA) Note  09/13/2021 Tara Woodward [id number]  Dustin Folks, 31 year old female who presents to [facility] ED involuntarily for treatment. Per triage note, Patient to ED via POV with mother for SI. Patient states she tried to choke herself because "she doesn't want to feel this way no more." Patient mother states hx of bipolar and that this "has been going on for a while."   During TTS assessment pt presents alert and oriented x 4, restless but cooperative, and mood-congruent with affect. The pt does not appear to be responding to internal or external stimuli. Neither is the pt presenting with any delusional thinking. Pt verified the information provided to triage RN.   Pt identifies her main complaint to be that she is severely depressed. Patient reports symptoms have been going on for a couple of months but worsened over the past couple of days. Patient reports she has been experiencing hate for others, numbness, not wanting to get out of bed and thinking that everyone would be better without her. Patient states she is compliant with her medications and takes them as prescribed; however, she thinks the psych meds are not working. Patient is unemployed and lives with her husband and 83 year old twin girl and boy. Patient states she was unable to work because she was paranoid that people were talking about her, nervous, anxious and did not want to be around others. Patient reports her husband is "somewhat supportive." Pt denies using any illicit substances and alcohol. Patient reports no prior INPT hx but is being followed by [facility] in [place], [place]. Patient states she was also seeing a therapist but stopped going for no reason. Patient reports poor eating and sleeping habits. "I didn't eat for 2 weeks." Pt denies HI/AH/VH. Pt is not able to contract for safety. "If I go home, I will try to kill myself again." Patient reports this was her 1st attempt.    Patient disposition  pending Provider consult.    Chief Complaint:  Chief Complaint  Patient presents with   Psychiatric Evaluation   Visit Diagnosis: Major depressive disorder    CCA Screening, Triage and Referral (STR)  Patient Reported Information How did you hear about [place]? Family/Friend  Referral name: No data recorded Referral phone number: No data recorded  Whom do you see for routine medical problems? No data recorded Practice/Facility Name: No data recorded Practice/Facility Phone Number: No data recorded Name of Contact: No data recorded Contact Number: No data recorded Contact Fax Number: No data recorded Prescriber Name: No data recorded Prescriber Address (if known): No data recorded  What Is the Reason for Your Visit/Call Today? Patient was brought to the ED by mom due to severe depression and suicidal attempt.  How Long Has This Been Causing You Problems? > than 6 months  What Do You Feel Would Help You the Most Today? Medication(s); Treatment for Depression or other mood problem   Have You Recently Been in Any Inpatient Treatment (Hospital/Detox/Crisis Center/28-Day Program)? No data recorded Name/Location of Program/Hospital:No data recorded How Long Were You There? No data recorded When Were You Discharged? No data recorded  Have You Ever Received Services From [facility] Before? No data recorded Who Do You See at [facility]? No data recorded  Have You Recently Had Any Thoughts About Hurting Yourself? Yes  Are You Planning to Commit Suicide/Harm Yourself At This time? No   Have you Recently Had Thoughts About Hurting Someone Karolee Ohs? No  Explanation: No data  recorded  Have You Used Any Alcohol or Drugs in the Past 24 Hours? No  How Long Ago Did You Use Drugs or Alcohol? No data recorded What Did You Use and How Much? No data recorded  Do You Currently Have a Therapist/Psychiatrist? No  Name of Therapist/Psychiatrist: No data recorded  Have You Been Recently  Discharged From Any Office Practice or Programs? No  Explanation of Discharge From Practice/Program: No data recorded    CCA Screening Triage Referral Assessment Type of Contact: Face-to-Face  Is this Initial or Reassessment? No data recorded Date Telepsych consult ordered in [facility]:  No data recorded Time Telepsych consult ordered in [facility]:  No data recorded  Patient Reported Information Reviewed? No data recorded Patient Left Without Being Seen? No data recorded Reason for Not Completing Assessment: No data recorded  Collateral Involvement: None provided   Does Patient Have a Court Appointed Legal Guardian? No data recorded Name and Contact of Legal Guardian: No data recorded If Minor and Not Living with Parent(s), Who has Custody? n/a  Is CPS involved or ever been involved? Never  Is APS involved or ever been involved? Never   Patient Determined To Be At Risk for Harm To Self or Others Based on Review of Patient Reported Information or Presenting Complaint? Yes, for Self-Harm  Method: No data recorded Availability of Means: No data recorded Intent: No data recorded Notification Required: No data recorded Additional Information for Danger to Others Potential: No data recorded Additional Comments for Danger to Others Potential: No data recorded Are There Guns or Other Weapons in Your Home? No data recorded Types of Guns/Weapons: No data recorded Are These Weapons Safely Secured?                            No data recorded Who Could Verify You Are Able To Have These Secured: No data recorded Do You Have any Outstanding Charges, Pending Court Dates, Parole/Probation? No data recorded Contacted To Inform of Risk of Harm To Self or Others: No data recorded  Location of Assessment: [facility] ED   Does Patient Present under Involuntary Commitment? Yes  IVC Papers Initial File Date: 09/13/21   [place] of Residence: [place]   Patient Currently Receiving the Following Services:  Medication Management   Determination of Need: Emergent (2 hours)   Options For Referral: ED Visit; Inpatient Hospitalization; Medication Management     CCA Biopsychosocial Intake/Chief Complaint:  No data recorded Current Symptoms/Problems: No data recorded  Patient Reported Schizophrenia/Schizoaffective Diagnosis in Past: No   Strengths: Patient able to communicate and verbalize needs.  Preferences: No data recorded Abilities: No data recorded  Type of Services Patient Feels are Needed: No data recorded  Initial Clinical Notes/Concerns: No data recorded  Mental Health Symptoms Depression:   Change in energy/activity; Sleep (too much or little); Hopelessness; Increase/decrease in appetite; Irritability; Difficulty Concentrating   Duration of Depressive symptoms:  Greater than two weeks   Mania:   None   Anxiety:    Difficulty concentrating; Sleep; Worrying   Psychosis:   None   Duration of Psychotic symptoms: No data recorded  Trauma:   None   Obsessions:   None   Compulsions:   None   Inattention:   None   Hyperactivity/Impulsivity:   None   Oppositional/Defiant Behaviors:   None   Emotional Irregularity:   Potentially harmful impulsivity; Chronic feelings of emptiness; Intense/inappropriate anger; Recurrent suicidal behaviors/gestures/threats   Other Mood/Personality Symptoms:  No data recorded   Mental Status Exam Appearance and self-care  Stature:   Average   Weight:   Overweight   Clothing:   Disheveled   Grooming:   Neglected   Cosmetic use:   None   Posture/gait:   Slumped   Motor activity:   Not Remarkable   Sensorium  Attention:   Normal   Concentration:   Anxiety interferes   Orientation:   X5   Recall/memory:   Normal   Affect and Mood  Affect:   Anxious; Depressed; Flat   Mood:   Anxious; Depressed   Relating  Eye contact:   Normal   Facial expression:   Anxious; Depressed; Sad   Attitude  toward examiner:   Cooperative   Thought and Language  Speech flow:  Clear and Coherent   Thought content:   Appropriate to Mood and Circumstances   Preoccupation:   None   Hallucinations:   None   Organization:  No data recorded  [employer] of Knowledge:   Average   Intelligence:   Average   Abstraction:   Functional   Judgement:   Fair   Reality Testing:   Adequate   Insight:   Fair   Decision Making:   Impulsive   Social Functioning  Social Maturity:   Isolates   Social Judgement:   Victimized   Stress  Stressors:   Work; Illness   Coping Ability:   Human resources officer Deficits:   Decision making   Supports:   Family     Religion:    Leisure/Recreation:    Exercise/Diet: Exercise/Diet Do You Follow a Special Diet?: No Do You Have Any Trouble Sleeping?: Yes Explanation of Sleeping Difficulties: Patient reports she has difficulty sleeping.   CCA Employment/Education Employment/Work Situation: Employment / Work Situation Employment Situation: Unemployed Patient's Job has Been Impacted by Current Illness: Yes Describe how Patient's Job has Been Impacted: Patient states she is too anxious and paranoid to work.  Education:     CCA Family/Childhood History Family and Relationship History: Family history Marital status: Married Does patient have children?: Yes How many children?: 2  Childhood History:     Child/Adolescent Assessment:     CCA Substance Use Alcohol/Drug Use: Alcohol / Drug Use Pain Medications: See PTA Prescriptions: See PTA Over the Counter: See PTA History of alcohol / drug use?: No history of alcohol / drug abuse                         ASAM's:  Six Dimensions of Multidimensional Assessment  Dimension 1:  Acute Intoxication and/or Withdrawal Potential:      Dimension 2:  Biomedical Conditions and Complications:      Dimension 3:  Emotional, Behavioral, or Cognitive  Conditions and Complications:     Dimension 4:  Readiness to Change:     Dimension 5:  Relapse, Continued use, or Continued Problem Potential:     Dimension 6:  Recovery/Living Environment:     ASAM Severity Score:    ASAM Recommended Level of Treatment:     Substance use Disorder (SUD)    Recommendations for Services/Supports/Treatments:    DSM5 Diagnoses: Patient Active Problem List   Diagnosis Date Noted   Genital warts 06/15/2021   HLD (hyperlipidemia) 04/20/2021   Morbid obesity (HCC) 03/15/2021   Acquired hypothyroidism 03/15/2021   Major depression, recurrent (HCC) 11/08/2016   HTN (hypertension) 01/24/2013    Patient Centered Plan: Patient  is on the following Treatment Plan(s):  Anxiety and Depression   Referrals to Alternative Service(s): Referred to Alternative Service(s):   Place:   Date:   Time:    Referred to Alternative Service(s):   Place:   Date:   Time:    Referred to Alternative Service(s):   Place:   Date:   Time:    Referred to Alternative Service(s):   Place:   Date:   Time:      @BHCOLLABOFCARE @  , Counselor, LCAS-A

## 2021-09-13 NOTE — BH IP Treatment Plan (Unsigned)
Interdisciplinary Treatment and Diagnostic Plan Update  09/13/2021 Time of Session: *** Tara Woodward MRN: [id number]  Principal Diagnosis: Severe recurrent major depression without psychotic features [facility])  Secondary Diagnoses: Principal Problem:   Severe recurrent major depression without psychotic features (HCC)   Current Medications:  Current Facility-Administered Medications  Medication Dose Route Frequency Provider Last Rate Last Admin   acetaminophen (TYLENOL) tablet 650 mg  650 mg Oral Q6H PRN Clapacs, Jackquline Denmark, MD       alum & mag hydroxide-simeth (MAALOX/MYLANTA) 200-200-20 MG/5ML suspension 30 mL  30 mL Oral Q4H PRN Clapacs, Jackquline Denmark, MD       [START ON 09/14/2021] atorvastatin (LIPITOR) tablet 10 mg  10 mg Oral Daily Clapacs, Jackquline Denmark, MD       [START ON 09/14/2021] FLUoxetine (PROZAC) capsule 20 mg  20 mg Oral Daily Clapacs, Jackquline Denmark, MD       hydrOXYzine (ATARAX) tablet 50 mg  50 mg Oral TID PRN Clapacs, Jackquline Denmark, MD       [START ON 09/14/2021] levothyroxine (SYNTHROID) tablet 125 mcg  125 mcg Oral Q0600 Clapacs, Jackquline Denmark, MD       [START ON 09/14/2021] losartan (COZAAR) tablet 50 mg  50 mg Oral Daily Clapacs, John T, MD       magnesium hydroxide (MILK OF MAGNESIA) suspension 30 mL  30 mL Oral Daily PRN Clapacs, Jackquline Denmark, MD       traZODone (DESYREL) tablet 100 mg  100 mg Oral QHS PRN Clapacs, Jackquline Denmark, MD       PTA Medications: Medications Prior to Admission  Medication Sig Dispense Refill Last Dose   acetaminophen (TYLENOL) 500 MG tablet Take 1,000 mg by mouth every 6 (six) hours as needed for moderate pain.      atorvastatin (LIPITOR) 10 MG tablet TAKE 1 TABLET BY MOUTH EVERY DAY 90 tablet 0    Drospirenone (SLYND) 4 MG TABS Take 1 tablet by mouth daily. 84 tablet 3    levothyroxine (SYNTHROID) 125 MCG tablet TAKE 1 TABLET BY MOUTH EVERY DAY 90 tablet 1    losartan (COZAAR) 50 MG tablet Take 1 tablet (50 mg total) by mouth daily. 90 tablet 1     Patient Stressors:    Patient  Strengths:    Treatment Modalities: Medication Management, Group therapy, Case management,  1 to 1 session with clinician, Psychoeducation, Recreational therapy.   Physician Treatment Plan for Primary Diagnosis: Severe recurrent major depression without psychotic features (HCC) Long Term Goal(s):     Short Term Goals:    Medication Management: Evaluate patient's response, side effects, and tolerance of medication regimen.  Therapeutic Interventions: 1 to 1 sessions, Unit Group sessions and Medication administration.  Evaluation of Outcomes: {BHH Tx Plan Outcomes:30414004}  Physician Treatment Plan for Secondary Diagnosis: Principal Problem:   Severe recurrent major depression without psychotic features (HCC)  Long Term Goal(s):     Short Term Goals:       Medication Management: Evaluate patient's response, side effects, and tolerance of medication regimen.  Therapeutic Interventions: 1 to 1 sessions, Unit Group sessions and Medication administration.  Evaluation of Outcomes: {BHH Tx Plan Outcomes:30414004}   RN Treatment Plan for Primary Diagnosis: Severe recurrent major depression without psychotic features (HCC) Long Term Goal(s): {BHH RN Tx Plan Long Term Goals:30414009::"Knowledge of disease and therapeutic regimen to maintain health will improve"}  Short Term Goals: Ability to remain free from injury will improve, Ability to verbalize feelings will improve, Ability to disclose and discuss  suicidal ideas, Ability to identify and develop effective coping behaviors will improve, and Compliance with prescribed medications will improve  Medication Management: RN will administer medications as ordered by provider, will assess and evaluate patient's response and provide education to patient for prescribed medication. RN will report any adverse and/or side effects to prescribing provider.  Therapeutic Interventions: 1 on 1 counseling sessions, Psychoeducation, Medication  administration, Evaluate responses to treatment, Monitor vital signs and CBGs as ordered, Perform/monitor CIWA, COWS, AIMS and Fall Risk screenings as ordered, Perform wound care treatments as ordered.  Evaluation of Outcomes: Not Progressing   LCSW Treatment Plan for Primary Diagnosis: Severe recurrent major depression without psychotic features (HCC) Long Term Goal(s): Safe transition to appropriate next level of care at discharge, Engage patient in therapeutic group addressing interpersonal concerns.  Short Term Goals: {BHH LCSW [place] PLAN SHORT TERM GOALS:30414010::"Engage patient in aftercare planning with referrals and resources"}  Therapeutic Interventions: Assess for all discharge needs, 1 to 1 time with Social worker, Explore available resources and support systems, Assess for adequacy in community support network, Educate family and significant other(s) on suicide prevention, Complete Psychosocial Assessment, Interpersonal group therapy.  Evaluation of Outcomes: {BHH Tx Plan Outcomes:30414004}   Progress in Treatment: Attending groups: {BHH ADULT:22608} Participating in groups: {BHH ADULT:22608} Taking medication as prescribed: {BHH ADULT:22608} Toleration medication: {BHH ADULT:22608} Family/Significant other contact made: {YES/NO/CONTACT:22665} Patient understands diagnosis: {BHH YTKPT:46568} Discussing patient identified problems/goals with staff: {BHH LEXNT:70017} Medical problems stabilized or resolved: {BHH ADULT:22608} Denies suicidal/homicidal ideation: {BHH ADULT:22608} Issues/concerns per patient self-inventory: {BHH ADULT:22608} Other: ***  New problem(s) identified: {BHH NEW PROBLEMS:22609}  New Short Term/Long Term Goal(s):  Patient Goals:    Discharge Plan or Barriers:   Reason for Continuation of Hospitalization: {BHH Reasons for continued hospitalization:22604}  Estimated Length of Stay:  Last 3 [place] Suicide Severity Risk Score: Flowsheet Row  Admission (Current) from 09/13/2021 in [facility] INPATIENT BEHAVIORAL MEDICINE Most recent reading at 09/13/2021 10:00 PM ED from 09/13/2021 in [facility] Most recent reading at 09/13/2021  8:47 AM ED from 12/09/2019 in [facility] Most recent reading at 12/09/2019  5:54 AM  C-SSRS RISK CATEGORY No Risk High Risk Low Risk       Last PHQ 2/9 Scores:    08/02/2021    1:35 PM 05/17/2021    8:47 AM 04/19/2021    8:51 AM  Depression screen PHQ 2/9  Decreased Interest 0 0 1  Down, Depressed, Hopeless 1 0 0  PHQ - 2 Score 1 0 1  Altered sleeping 1 2 2   Tired, decreased energy 1 2 1   Change in appetite 0 0 0  Feeling bad or failure about yourself  0 0 0  Trouble concentrating 0 0 0  Moving slowly or fidgety/restless 0 0 0  Suicidal thoughts 0 0 0  PHQ-9 Score 3 4 4   Difficult doing work/chores Somewhat difficult Not difficult at all Not difficult at all    Scribe for Treatment Team: , RN 09/13/2021 11:25 PM

## 2021-09-13 NOTE — ED Notes (Signed)
Patient provided snack at appropriate snack time.  Pt consumed 100% of snack provided, tolerated well w/o complaints   Trash disposted of appropriately by patient.  

## 2021-09-14 DIAGNOSIS — F332 Major depressive disorder, recurrent severe without psychotic features: Secondary | ICD-10-CM | POA: Diagnosis not present

## 2021-09-14 DIAGNOSIS — F4010 Social phobia, unspecified: Secondary | ICD-10-CM

## 2021-09-14 NOTE — Plan of Care (Signed)
  Problem: Education: Goal: Knowledge of [place] [facility] Education information/materials will improve Outcome: Progressing Goal: Emotional status will improve Outcome: Progressing Goal: Mental status will improve Outcome: Progressing Goal: Verbalization of understanding the information provided will improve Outcome: Progressing   Problem: Health Behavior/Discharge Planning: Goal: Compliance with treatment plan for underlying cause of condition will improve Outcome: Progressing   Problem: Safety: Goal: Periods of time without injury will increase Outcome: Progressing   Problem: Education: Goal: Knowledge of the prescribed therapeutic regimen will improve Outcome: Progressing

## 2021-09-14 NOTE — Group Note (Signed)
[facility] LCSW Group Therapy Note   Group Date: 09/14/2021 Start Time: 1300 End Time: 1400   Type of Therapy/Topic:  Group Therapy:  Emotion Regulation  Participation Level:  Active    Description of Group:    The purpose of this group is to assist patients in learning to regulate negative emotions and experience positive emotions. Patients will be guided to discuss ways in which they have been vulnerable to their negative emotions. These vulnerabilities will be juxtaposed with experiences of positive emotions or situations, and patients challenged to use positive emotions to combat negative ones. Special emphasis will be placed on coping with negative emotions in conflict situations, and patients will process healthy conflict resolution skills.  Therapeutic Goals: Patient will identify two positive emotions or experiences to reflect on in order to balance out negative emotions:  Patient will label two or more emotions that they find the most difficult to experience:  Patient will be able to demonstrate positive conflict resolution skills through discussion or role plays:   Summary of Patient Progress: Patient was present for the entirety of the group. She was actively involved in the discussion and presented with slight insight regarding the topic. Pt identified depression/really deep sadness and anxiety as some strong emotions that she felt prior to coming to the hospital. She stated that life in general can be triggering, citing finances as a point of worry. Pt identified feeling like she cannot breathe and tremors as physical symptoms that let her know that she is anxious and overwhelmed. She stated that she used to work at [employer] but experienced this which made her realize that she has difficulty with large crowds. Pt shared that when she began to feel like this she went to the bathroom, shut herself in the stall, and began to focus on her breathing while rocking back and forth. She stated that  this helped her calm down enough to finish her shift but acknowledged that she did not return to work after that shift because of her anxiety. Pt also stated that her suicide attempt was a bad response to her negative emotions. She expressed regret around the decision and acknowledged that she should have talked to someone instead. Pt talked about feeling that she needed to reconnect with a therapist but endorsed difficulty doing this due to insurance limitations. She also endorsed utilizing games on her phone to help calm her down when experiencing strong emotions. Pt appeared open and receptive to feedback/comments from facilitator.    Therapeutic Modalities:   Cognitive Behavioral Therapy Feelings Identification Dialectical Behavioral Therapy   Glenis Smoker, LCSW

## 2021-09-14 NOTE — H&P (Signed)
Psychiatric Admission Assessment Adult  Patient Identification: Tara Woodward MRN:  [id number] Date of Evaluation:  09/14/2021 Chief Complaint:  Severe recurrent major depression without psychotic features (HCC) [F33.2] Principal Diagnosis: Severe recurrent major depression without psychotic features (HCC) Diagnosis:  Principal Problem:   Severe recurrent major depression without psychotic features (HCC) Active Problems:   HTN (hypertension)   Acquired hypothyroidism   HLD (hyperlipidemia)   Social anxiety disorder  History of Present Illness: Patient seen and chart reviewed.  31 year old woman with a past history of depression and anxiety came to the emergency room stating she would having suicidal thoughts and also that she had tried to choke herself earlier in the day.  Patient made multiple statements in the ER about having suicidal ideation.  Patient states that her mood has been bad for quite a while it.  It goes up and down but has recently been worse with more suicidal ideation and impulsive behaviors.  Also describes chronic anxiety specifically social anxiety with particular focus on feeling that people are judging her whenever she is in a work situation.  Patient denies alcohol or drug abuse.  Claims that she was taking antidepressant medication although she could not remember what it was and there was no evidence that I could find in the chart of any recent prescription. Associated Signs/Symptoms: Depression Symptoms:  depressed mood, anhedonia, difficulty concentrating, suicidal attempt, anxiety, Duration of Depression Symptoms: Greater than two weeks  (Hypo) Manic Symptoms:  Impulsivity, Anxiety Symptoms:  Agoraphobia, Psychotic Symptoms:  Paranoia, PTSD Symptoms: NA Total Time spent with patient: 1 hour  Past Psychiatric History: Past history of longstanding depression.  Previous treatment Long interrupted.  Past history of self-injury.  No history of substance abuse  identified  Is the patient at risk to self? Yes.    Has the patient been a risk to self in the past 6 months? Yes.    Has the patient been a risk to self within the distant past? Yes.    Is the patient a risk to others? No.  Has the patient been a risk to others in the past 6 months? No.  Has the patient been a risk to others within the distant past? No.   Prior Inpatient Therapy:   Prior Outpatient Therapy:    Alcohol Screening: 1. How often do you have a drink containing alcohol?: Never 2. How many drinks containing alcohol do you have on a typical day when you are drinking?: 1 or 2 3. How often do you have six or more drinks on one occasion?: Never AUDIT-C Score: 0 4. How often during the last year have you found that you were not able to stop drinking once you had started?: Never 5. How often during the last year have you failed to do what was normally expected from you because of drinking?: Never 6. How often during the last year have you needed a first drink in the morning to get yourself going after a heavy drinking session?: Never 7. How often during the last year have you had a feeling of guilt of remorse after drinking?: Never 8. How often during the last year have you been unable to remember what happened the night before because you had been drinking?: Never 9. Have you or someone else been injured as a result of your drinking?: No 10. Has a relative or friend or a doctor or another health worker been concerned about your drinking or suggested you cut down?: No Alcohol Use Disorder Identification  Test Final Score (AUDIT): 0 Substance Abuse History in the last 12 months:  No. Consequences of Substance Abuse: Negative Previous Psychotropic Medications: Yes  Psychological Evaluations: Yes  Past Medical History:  Past Medical History:  Diagnosis Date   Depression    HTN (hypertension)    Hypothyroidism    Morbid obesity with BMI of 45.0-49.9, adult (HCC)    History  reviewed. No pertinent surgical history. Family History:  Family History  Problem Relation Age of Onset   Depression Mother    Bipolar disorder Mother    Bipolar disorder Father    Healthy Sister    Hypertension Maternal Grandmother    Diabetes Maternal Grandfather    Lung cancer Maternal Grandfather    Diabetes Maternal Aunt    Breast cancer Maternal Aunt    Hypertension Maternal Aunt    Family Psychiatric  History: See previous Tobacco Screening:   Social History:  Social History   Substance and Sexual Activity  Alcohol Use No     Social History   Substance and Sexual Activity  Drug Use No    Additional Social History: Marital status: Married Number of Years Married: 14 What types of issues is patient dealing with in the relationship?: "marriage is no cake walk but we get through it" Does patient have children?: Yes How many children?: 2 How is patient's relationship with their children?: "I love them"                         Allergies:  No Known Allergies Lab Results:  Results for orders placed or performed during the hospital encounter of 09/13/21 (from the past 48 hour(s))  Comprehensive metabolic panel     Status: None   Collection Time: 09/13/21  8:48 AM  Result Value Ref Range   Sodium 137 135 - 145 mmol/L   Potassium 3.8 3.5 - 5.1 mmol/L   Chloride 107 98 - 111 mmol/L   CO2 24 22 - 32 mmol/L   Glucose, Bld 91 70 - 99 mg/dL    Comment: Glucose reference range applies only to samples taken after fasting for at least 8 hours.   BUN 10 6 - 20 mg/dL   Creatinine, Ser 6.73 0.44 - 1.00 mg/dL   Calcium 9.1 8.9 - 41.9 mg/dL   Total Protein 8.1 6.5 - 8.1 g/dL   Albumin 4.2 3.5 - 5.0 g/dL   AST 16 15 - 41 U/L   ALT 10 0 - 44 U/L   Alkaline Phosphatase 73 38 - 126 U/L   Total Bilirubin 0.6 0.3 - 1.2 mg/dL   GFR, Estimated >37 >90 mL/min    Comment: (NOTE) Calculated using the CKD-EPI Creatinine Equation (2021)    Anion gap 6 5 - 15    Comment:  Performed at [facility], [address], [place], [place] [address]  Salicylate level     Status: Abnormal   Collection Time: 09/13/21  8:48 AM  Result Value Ref Range   Salicylate Lvl <7.0 (L) 7.0 - 30.0 mg/dL    Comment: Performed at [facility], [address] [place], [place] [address]  Acetaminophen level     Status: Abnormal   Collection Time: 09/13/21  8:48 AM  Result Value Ref Range   Acetaminophen (Tylenol), Serum <10 (L) 10 - 30 ug/mL    Comment: (NOTE) Therapeutic concentrations vary significantly. A range of 10-30 ug/mL  may be an effective concentration for many patients. However, some  are best treated at concentrations outside of this range. Acetaminophen concentrations >150 ug/mL at 4 hours after ingestion  and >50 ug/mL at 12 hours after ingestion are often associated with  toxic reactions.  Performed at [facility], [address] [place], [place] [address]   cbc     Status: Abnormal   Collection Time: 09/13/21  8:48 AM  Result Value Ref Range   WBC 6.6 4.0 - 10.5 K/uL   RBC 4.83 3.87 - 5.11 MIL/uL   Hemoglobin 13.6 12.0 - 15.0 g/dL   HCT 86.7 67.2 - 09.4 %   MCV 83.4 80.0 - 100.0 fL   MCH 28.2 26.0 - 34.0 pg   MCHC 33.7 30.0 - 36.0 g/dL   RDW 70.9 62.8 - 36.6 %   Platelets 448 (H) 150 - 400 K/uL   nRBC 0.0 0.0 - 0.2 %    Comment: Performed at [facility], [address] [place], [place] [address]  Ethanol     Status: None   Collection Time: 09/13/21  8:49 AM  Result Value Ref Range   Alcohol, Ethyl (B) <10 <10 mg/dL    Comment: (NOTE) Lowest detectable limit for serum alcohol is 10 mg/dL.  For medical purposes only. Performed at [facility] [address] [place], [place] [address]   POC urine preg, ED     Status: None   Collection Time: 09/13/21  8:57 AM  Result Value Ref Range   Preg Test, Ur NEGATIVE NEGATIVE    Comment:        THE SENSITIVITY OF THIS METHODOLOGY IS >24 mIU/mL    Urine Drug Screen, Qualitative     Status: None   Collection Time: 09/13/21  8:58 AM  Result Value Ref Range   Tricyclic, Ur Screen NONE DETECTED NONE DETECTED   Amphetamines, Ur Screen NONE DETECTED NONE DETECTED   MDMA (Ecstasy)Ur Screen NONE DETECTED NONE DETECTED   Cocaine Metabolite,Ur [place] NONE DETECTED NONE DETECTED   Opiate, Ur Screen NONE DETECTED NONE DETECTED   Phencyclidine (PCP) Ur S NONE DETECTED NONE DETECTED   Cannabinoid 50 Ng, Ur [place] NONE DETECTED NONE DETECTED   Barbiturates, Ur Screen NONE DETECTED NONE DETECTED   Benzodiazepine, Ur Scrn NONE DETECTED NONE DETECTED   Methadone Scn, Ur NONE DETECTED NONE DETECTED    Comment: (NOTE) Tricyclics + metabolites, urine    Cutoff 1000 ng/mL Amphetamines + metabolites, urine  Cutoff 1000 ng/mL MDMA (Ecstasy), urine              Cutoff 500 ng/mL Cocaine Metabolite, urine          Cutoff 300 ng/mL Opiate + metabolites, urine        Cutoff 300 ng/mL Phencyclidine (PCP), urine         Cutoff 25 ng/mL Cannabinoid, urine                 Cutoff 50 ng/mL Barbiturates + metabolites, urine  Cutoff 200 ng/mL Benzodiazepine, urine              Cutoff 200 ng/mL Methadone, urine                   Cutoff 300 ng/mL  The urine drug screen provides only a preliminary, unconfirmed analytical test result and should not be used for non-medical purposes. Clinical consideration and professional judgment should be applied to any positive drug screen result due to possible interfering substances. A more specific alternate chemical method must be used  in order to obtain a confirmed analytical result. Gas chromatography / mass spectrometry (GC/MS) is the preferred confirm atory method. Performed at [facility], [address] [place], [place] [address]   Resp Panel by RT-PCR (Flu A&B, Covid) Anterior Nasal Swab     Status: None   Collection Time: 09/13/21  9:10 AM   Specimen: Anterior Nasal Swab  Result Value Ref Range   SARS  Coronavirus 2 by RT PCR NEGATIVE NEGATIVE    Comment: (NOTE) SARS-CoV-2 target nucleic acids are NOT DETECTED.  The SARS-CoV-2 RNA is generally detectable in upper respiratory specimens during the acute phase of infection. The lowest concentration of SARS-CoV-2 viral copies this assay can detect is 138 copies/mL. A negative result does not preclude SARS-Cov-2 infection and should not be used as the sole basis for treatment or other patient management decisions. A negative result may occur with  improper specimen collection/handling, submission of specimen other than nasopharyngeal swab, presence of viral mutation(s) within the areas targeted by this assay, and inadequate number of viral copies(<138 copies/mL). A negative result must be combined with clinical observations, patient history, and epidemiological information. The expected result is Negative.  Fact Sheet for Patients:  BloggerCourse.com  Fact Sheet for Healthcare Providers:  SeriousBroker.it  This test is no t yet approved or cleared by the [place] FDA and  has been authorized for detection and/or diagnosis of SARS-CoV-2 by FDA under an Emergency Use Authorization (EUA). This EUA will remain  in effect (meaning this test can be used) for the duration of the COVID-19 declaration under Section 564(b)(1) of the Act, 21 U.S.C.section 360bbb-3(b)(1), unless the authorization is terminated  or revoked sooner.       Influenza A by PCR NEGATIVE NEGATIVE   Influenza B by PCR NEGATIVE NEGATIVE    Comment: (NOTE) The Xpert Xpress SARS-CoV-2/FLU/RSV plus assay is intended as an aid in the diagnosis of influenza from Nasopharyngeal swab specimens and should not be used as a sole basis for treatment. Nasal washings and aspirates are unacceptable for Xpert Xpress SARS-CoV-2/FLU/RSV testing.  Fact Sheet for Patients: BloggerCourse.com  Fact Sheet  for Healthcare Providers: SeriousBroker.it  This test is not yet approved or cleared by the [place] FDA and has been authorized for detection and/or diagnosis of SARS-CoV-2 by FDA under an Emergency Use Authorization (EUA). This EUA will remain in effect (meaning this test can be used) for the duration of the COVID-19 declaration under Section 564(b)(1) of the Act, 21 U.S.C. section 360bbb-3(b)(1), unless the authorization is terminated or revoked.  Performed at [facility], [address] [place], [place] [address]     Blood Alcohol level:  Lab Results  Component Value Date   [facility] <10 09/13/2021   ETH <10 03/05/2020    Metabolic Disorder Labs:  Lab Results  Component Value Date   HGBA1C 4.9 08/02/2021   MPG 94 08/02/2021   MPG 94 03/15/2021   Lab Results  Component Value Date   PROLACTIN 20.0 12/15/2009   Lab Results  Component Value Date   CHOL 182 08/02/2021   TRIG 335 (H) 08/02/2021   HDL 50 08/02/2021   CHOLHDL 3.6 08/02/2021   LDLCALC 87 08/02/2021   LDLCALC 199 (H) 03/15/2021    Current Medications: Current Facility-Administered Medications  Medication Dose Route Frequency Provider Last Rate Last Admin   acetaminophen (TYLENOL) tablet 650 mg  650 mg Oral Q6H PRN Briony Parveen, Jackquline Denmark, MD       alum & mag hydroxide-simeth (  MAALOX/MYLANTA) 200-200-20 MG/5ML suspension 30 mL  30 mL Oral Q4H PRN Elane Peabody T, MD       atorvastatin (LIPITOR) tablet 10 mg  10 mg Oral Daily Camdan Burdi, Jackquline Denmark, MD   10 mg at 09/14/21 0806   FLUoxetine (PROZAC) capsule 20 mg  20 mg Oral Daily Randi Poullard, Jackquline Denmark, MD   20 mg at 09/14/21 6599   hydrOXYzine (ATARAX) tablet 50 mg  50 mg Oral TID PRN Ruben Pyka, Jackquline Denmark, MD   50 mg at 09/14/21 3570   levothyroxine (SYNTHROID) tablet 125 mcg  125 mcg Oral Q0600 Dimitriy Carreras T, MD   125 mcg at 09/14/21 1779   losartan (COZAAR) tablet 50 mg  50 mg Oral Daily Aylee Littrell, Jackquline Denmark, MD   50 mg at 09/14/21 3903    magnesium hydroxide (MILK OF MAGNESIA) suspension 30 mL  30 mL Oral Daily PRN Mikhaela Zaugg, Jackquline Denmark, MD       traZODone (DESYREL) tablet 100 mg  100 mg Oral QHS PRN Ariely Riddell, Jackquline Denmark, MD       PTA Medications: Medications Prior to Admission  Medication Sig Dispense Refill Last Dose   acetaminophen (TYLENOL) 500 MG tablet Take 1,000 mg by mouth every 6 (six) hours as needed for moderate pain.   Past Month   atorvastatin (LIPITOR) 10 MG tablet TAKE 1 TABLET BY MOUTH EVERY DAY 90 tablet 0 Past Week   citalopram (CELEXA) 20 MG tablet Take 20 mg by mouth daily.   Past Week   Drospirenone (SLYND) 4 MG TABS Take 1 tablet by mouth daily. 84 tablet 3 Past Week   levothyroxine (SYNTHROID) 125 MCG tablet TAKE 1 TABLET BY MOUTH EVERY DAY 90 tablet 1 Past Week   losartan (COZAAR) 50 MG tablet Take 1 tablet (50 mg total) by mouth daily. 90 tablet 1 Past Week    Musculoskeletal: Strength & Muscle Tone: within normal limits Gait & Station: normal Patient leans: N/A            Psychiatric Specialty Exam:  Presentation  General Appearance: No data recorded Eye Contact:No data recorded Speech:No data recorded Speech Volume:No data recorded Handedness:No data recorded  Mood and Affect  Mood:No data recorded Affect:No data recorded  Thought Process  Thought Processes:No data recorded Duration of Psychotic Symptoms: No data recorded Past Diagnosis of Schizophrenia or Psychoactive disorder: No  Descriptions of Associations:No data recorded Orientation:No data recorded Thought Content:No data recorded Hallucinations:No data recorded Ideas of Reference:No data recorded Suicidal Thoughts:No data recorded Homicidal Thoughts:No data recorded  Sensorium  Memory:No data recorded Judgment:No data recorded Insight:No data recorded  Executive Functions  Concentration:No data recorded Attention Span:No data recorded Recall:No data recorded Fund of Knowledge:No data recorded Language:No data  recorded  Psychomotor Activity  Psychomotor Activity:No data recorded  Assets  Assets:No data recorded  Sleep  Sleep:No data recorded   Physical Exam: Physical Exam Vitals and nursing note reviewed.  Constitutional:      Appearance: Normal appearance.  HENT:     Head: Normocephalic and atraumatic.     Mouth/Throat:     Pharynx: Oropharynx is clear.  Eyes:     Pupils: Pupils are equal, round, and reactive to light.  Cardiovascular:     Rate and Rhythm: Normal rate and regular rhythm.  Pulmonary:     Effort: Pulmonary effort is normal.     Breath sounds: Normal breath sounds.  Abdominal:     General: Abdomen is flat.     Palpations: Abdomen is soft.  Musculoskeletal:        General: Normal range of motion.  Skin:    General: Skin is warm and dry.  Neurological:     General: No focal deficit present.     Mental Status: She is alert. Mental status is at baseline.  Psychiatric:        Mood and Affect: Mood normal.        Thought Content: Thought content normal.    Review of Systems  Constitutional: Negative.   HENT: Negative.    Eyes: Negative.   Respiratory: Negative.    Cardiovascular: Negative.   Gastrointestinal: Negative.   Musculoskeletal: Negative.   Skin: Negative.   Neurological: Negative.   Psychiatric/Behavioral: Negative.     Blood pressure 130/90, pulse 84, temperature 99.5 F (37.5 C), temperature source Oral, resp. rate 20, height  (1.651 m), weight 128.1 kg, SpO2 100 %. Body mass index is 47.01 kg/m.  Treatment Plan Summary: Medication management and Plan recommended medication management by addition of fluoxetine 20 mg a day.  Continue outpatient medicines for hypothyroidism and blood pressure.  Engage in individual and group therapy regularly.  Continue 15-minute checks.  Ongoing reassessment with the possibility of discharge as early as tomorrow if patient is stable.  She will need referral to appropriate outpatient providers at  discharge  Observation Level/Precautions:  15 minute checks  Laboratory:  Chemistry Profile  Psychotherapy:    Medications:    Consultations:    Discharge Concerns:    Estimated LOS:  Other:     Physician Treatment Plan for Primary Diagnosis: Severe recurrent major depression without psychotic features (HCC) Long Term Goal(s): Improvement in symptoms so as ready for discharge  Short Term Goals: Ability to disclose and discuss suicidal ideas and Ability to demonstrate self-control will improve  Physician Treatment Plan for Secondary Diagnosis: Principal Problem:   Severe recurrent major depression without psychotic features (HCC) Active Problems:   HTN (hypertension)   Acquired hypothyroidism   HLD (hyperlipidemia)   Social anxiety disorder  Long Term Goal(s): Improvement in symptoms so as ready for discharge  Short Term Goals: Ability to identify and develop effective coping behaviors will improve, Ability to maintain clinical measurements within normal limits will improve, and Compliance with prescribed medications will improve  I certify that inpatient services furnished can reasonably be expected to improve the patient's condition.    Mordecai Rasmussen, MD 7/12/20232:00 PM

## 2021-09-14 NOTE — Plan of Care (Signed)
  Problem: Education: Goal: Knowledge of General Education information will improve Description: Including pain rating scale, medication(s)/side effects and non-pharmacologic comfort measures Outcome: Progressing   Problem: Health Behavior/Discharge Planning: Goal: Ability to manage health-related needs will improve Outcome: Progressing   Problem: Clinical Measurements: Goal: Ability to maintain clinical measurements within normal limits will improve Outcome: Progressing Goal: Will remain free from infection Outcome: Progressing Goal: Diagnostic test results will improve Outcome: Progressing Goal: Respiratory complications will improve Outcome: Progressing Goal: Cardiovascular complication will be avoided Outcome: Progressing   Problem: Education: Goal: Ability to make informed decisions regarding treatment will improve Outcome: Progressing   Problem: Self-Concept: Goal: Ability to identify factors that promote anxiety will improve Outcome: Progressing Goal: Level of anxiety will decrease Outcome: Progressing Goal: Ability to modify response to factors that promote anxiety will improve Outcome: Progressing

## 2021-09-14 NOTE — [facility] Suicide Risk Assessment (Signed)
[facility] INPATIENT:  Family/Significant Other Suicide Prevention Education  Suicide Prevention Education:  Education Completed; Desmond Tufano, husband, [phone] has been identified by the patient as the family member/significant other with whom the patient will be residing, and identified as the person(s) who will aid the patient in the event of a mental health crisis (suicidal ideations/suicide attempt).  With written consent from the patient, the family member/significant other has been provided the following suicide prevention education, prior to the and/or following the discharge of the patient.  The suicide prevention education provided includes the following: Suicide risk factors Suicide prevention and interventions National Suicide Hotline telephone number [facility] assessment telephone number [facility] Emergency Assistance 911 [facility] and/or Residential Mobile Crisis Unit telephone number  Request made of family/significant other to: Remove weapons (e.g., guns, rifles, knives), all items previously/currently identified as safety concern.   Remove drugs/medications (over-the-counter, prescriptions, illicit drugs), all items previously/currently identified as a safety concern.  The family member/significant other verbalizes understanding of the suicide prevention education information provided.  The family member/significant other agrees to remove the items of safety concern listed above.  CSW spoke with the patient's husband.  Husband reports that "every little thing will set her off". He reports "if the air stays on longer than she thinks it should or noises like the neighbor bouncing his basketball outside" will set the patient off.  Husband explained that they have an older AC unit so when the temperature outside increases the unit takes awhile to turn off.  He reports that "set off" looks like "trying to go and punch holes in the wall, hitting at stuff.  She has  cursed the thermostat out for coming on."  He reports that the patient's behaviors have been going on for "a while", clarifying that the behaviors have been occurring for 13 years but have gotten worse since "2018".  He reports that "it was not as bad when we were living with her Memaw and mom but that soured and it became and it became constant yelling between her and her grandmother.  He denies any weapons in the home. He reports patient is a danger to self "when she gets upset, with the rages she has, trying to punch holes in the wall".     Harden Mo 09/14/2021, 1:24 PM

## 2021-09-14 NOTE — BH IP Treatment Plan (Signed)
Interdisciplinary Treatment and Diagnostic Plan Update  09/14/2021 Time of Session: 9:30AM Tara Woodward MRN: [id number]  Principal Diagnosis: Severe recurrent major depression without psychotic features (HCC)  Secondary Diagnoses: Principal Problem:   Severe recurrent major depression without psychotic features (HCC)   Current Medications:  Current Facility-Administered Medications  Medication Dose Route Frequency Provider Last Rate Last Admin   acetaminophen (TYLENOL) tablet 650 mg  650 mg Oral Q6H PRN Clapacs, John T, MD       alum & mag hydroxide-simeth (MAALOX/MYLANTA) 200-200-20 MG/5ML suspension 30 mL  30 mL Oral Q4H PRN Clapacs, John T, MD       atorvastatin (LIPITOR) tablet 10 mg  10 mg Oral Daily Clapacs, Jackquline Denmark, MD   10 mg at 09/14/21 0806   FLUoxetine (PROZAC) capsule 20 mg  20 mg Oral Daily Clapacs, Jackquline Denmark, MD   20 mg at 09/14/21 6606   hydrOXYzine (ATARAX) tablet 50 mg  50 mg Oral TID PRN Clapacs, Jackquline Denmark, MD   50 mg at 09/14/21 0811   levothyroxine (SYNTHROID) tablet 125 mcg  125 mcg Oral Q0600 Clapacs, John T, MD   125 mcg at 09/14/21 3016   losartan (COZAAR) tablet 50 mg  50 mg Oral Daily Clapacs, Jackquline Denmark, MD   50 mg at 09/14/21 0806   magnesium hydroxide (MILK OF MAGNESIA) suspension 30 mL  30 mL Oral Daily PRN Clapacs, Jackquline Denmark, MD       traZODone (DESYREL) tablet 100 mg  100 mg Oral QHS PRN Clapacs, Jackquline Denmark, MD       PTA Medications: Medications Prior to Admission  Medication Sig Dispense Refill Last Dose   acetaminophen (TYLENOL) 500 MG tablet Take 1,000 mg by mouth every 6 (six) hours as needed for moderate pain.   Past Month   atorvastatin (LIPITOR) 10 MG tablet TAKE 1 TABLET BY MOUTH EVERY DAY 90 tablet 0 Past Week   citalopram (CELEXA) 20 MG tablet Take 20 mg by mouth daily.   Past Week   Drospirenone (SLYND) 4 MG TABS Take 1 tablet by mouth daily. 84 tablet 3 Past Week   levothyroxine (SYNTHROID) 125 MCG tablet TAKE 1 TABLET BY MOUTH EVERY DAY 90 tablet 1 Past Week    losartan (COZAAR) 50 MG tablet Take 1 tablet (50 mg total) by mouth daily. 90 tablet 1 Past Week    Patient Stressors:    Patient Strengths:    Treatment Modalities: Medication Management, Group therapy, Case management,  1 to 1 session with clinician, Psychoeducation, Recreational therapy.   Physician Treatment Plan for Primary Diagnosis: Severe recurrent major depression without psychotic features (HCC) Long Term Goal(s):     Short Term Goals:    Medication Management: Evaluate patient's response, side effects, and tolerance of medication regimen.  Therapeutic Interventions: 1 to 1 sessions, Unit Group sessions and Medication administration.  Evaluation of Outcomes: Progressing  Physician Treatment Plan for Secondary Diagnosis: Principal Problem:   Severe recurrent major depression without psychotic features (HCC)  Long Term Goal(s):     Short Term Goals:       Medication Management: Evaluate patient's response, side effects, and tolerance of medication regimen.  Therapeutic Interventions: 1 to 1 sessions, Unit Group sessions and Medication administration.  Evaluation of Outcomes: Progressing   RN Treatment Plan for Primary Diagnosis: Severe recurrent major depression without psychotic features (HCC) Long Term Goal(s): Knowledge of disease and therapeutic regimen to maintain health will improve  Short Term Goals: Ability to remain free from injury  will improve, Ability to verbalize frustration and anger appropriately will improve, Ability to demonstrate self-control, Ability to participate in decision making will improve, Ability to verbalize feelings will improve, Ability to disclose and discuss suicidal ideas, Ability to identify and develop effective coping behaviors will improve, and Compliance with prescribed medications will improve  Medication Management: RN will administer medications as ordered by provider, will assess and evaluate patient's response and provide  education to patient for prescribed medication. RN will report any adverse and/or side effects to prescribing provider.  Therapeutic Interventions: 1 on 1 counseling sessions, Psychoeducation, Medication administration, Evaluate responses to treatment, Monitor vital signs and CBGs as ordered, Perform/monitor CIWA, COWS, AIMS and Fall Risk screenings as ordered, Perform wound care treatments as ordered.  Evaluation of Outcomes: Progressing   LCSW Treatment Plan for Primary Diagnosis: Severe recurrent major depression without psychotic features (HCC) Long Term Goal(s): Safe transition to appropriate next level of care at discharge, Engage patient in therapeutic group addressing interpersonal concerns.  Short Term Goals: Engage patient in aftercare planning with referrals and resources, Increase social support, Increase ability to appropriately verbalize feelings, Increase emotional regulation, Facilitate acceptance of mental health diagnosis and concerns, and Increase skills for wellness and recovery  Therapeutic Interventions: Assess for all discharge needs, 1 to 1 time with Social worker, Explore available resources and support systems, Assess for adequacy in community support network, Educate family and significant other(s) on suicide prevention, Complete Psychosocial Assessment, Interpersonal group therapy.  Evaluation of Outcomes: Progressing   Progress in Treatment: Attending groups: No. Participating in groups: No. Taking medication as prescribed: Yes. Toleration medication: Yes. Family/Significant other contact made: No, will contact:  when given permission. Patient understands diagnosis: Yes. Discussing patient identified problems/goals with staff: Yes. Medical problems stabilized or resolved: Yes. Denies suicidal/homicidal ideation: Yes. Issues/concerns per patient self-inventory: No. Other: none.  New problem(s) identified: No, Describe:  none identified.  New Short Term/Long  Term Goal(s): medication management for mood stabilization; elimination of SI thoughts; development of comprehensive mental wellness plan.  Patient Goals:  "See a therapist and see what my insurance can accept. Getting my medication worked out so I can go home to my kids."  Discharge Plan or Barriers: CSW will assist pt with development of an appropriate aftercare/discharge plan.   Reason for Continuation of Hospitalization: Depression Medication stabilization Suicidal ideation  Estimated Length of Stay: 1-7 days  Last 3 [place] Suicide Severity Risk Score: Flowsheet Row Admission (Current) from 09/13/2021 in [facility] INPATIENT BEHAVIORAL MEDICINE Most recent reading at 09/13/2021 10:00 PM ED from 09/13/2021 [facility] Most recent reading at 09/13/2021  8:47 AM ED from 12/09/2019 in [facility] Most recent reading at 12/09/2019  5:54 AM  C-SSRS RISK CATEGORY No Risk High Risk Low Risk       Last PHQ 2/9 Scores:    08/02/2021    1:35 PM 05/17/2021    8:47 AM 04/19/2021    8:51 AM  Depression screen PHQ 2/9  Decreased Interest 0 0 1  Down, Depressed, Hopeless 1 0 0  PHQ - 2 Score 1 0 1  Altered sleeping 1 2 2   Tired, decreased energy 1 2 1   Change in appetite 0 0 0  Feeling bad or failure about yourself  0 0 0  Trouble concentrating 0 0 0  Moving slowly or fidgety/restless 0 0 0  Suicidal thoughts 0 0 0  PHQ-9 Score 3 4 4   Difficult doing work/chores Somewhat  difficult Not difficult at all Not difficult at all    Scribe for Treatment Team: Glenis Smoker, LCSW 09/14/2021 10:16 AM

## 2021-09-14 NOTE — Progress Notes (Signed)
Patient is pleasant and easy to engage in conversation. Her mother just left visitation and she is wanting the clothes that her mother brought in scanned so she can take back to the room.  Clothes scanned and given to patient.  She reports that she may be able to leave tomorrow. She states that overall she is doing well here. She denies si/hi/avh and depression. Has some  mild situational anxiety as this is the first time she has been hospitalized here. Reminded patient that snack woyld be coming up.  Also made patient aware that she had no QHS medications to take tonight and would not need to come to med pass unless there was something that she needed.  Tara Woodward verbalized understanding. Encouraged her to seek staff with any concerns.  Q15 minute safety checks in place.   C Butler-Nicholson, LPN
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
## 2021-09-14 NOTE — Progress Notes (Signed)
D: Patient alert and oriented. Patient denies pain. Patient denies depression. Patient endorses anxiety 4/10. Stating that she is anxious because this is her first time on the unit and she is worried about her kids. PRN hydroxyzine was provided to patient. Patient rated anxiety 0/10 when reassessed. Patient denies SI/HI/AVH. Patient has been isolative to room but has occasionally came out to the dayroom and colored. Patient also came out to make some phone calls.   A: Scheduled medications administered to patient, per MD orders.  Support and encouragement provided to patient.  Q15 minute safety checks maintained.   R: Patient compliant with medication administration and treatment plan. No adverse drug reactions noted. Patient remains safe on the unit at this time.

## 2021-09-14 NOTE — Progress Notes (Signed)
   09/13/21 2200  Psych Admission Type (Psych Patients Only)  Admission Status Involuntary  Psychosocial Assessment  Patient Complaints Anxiety;Depression  Eye Contact Fair  Facial Expression Worried  Affect Depressed  Speech Soft  Interaction Cautious  Motor Activity Slow  Appearance/Hygiene In scrubs  Behavior Characteristics Cooperative;Calm  Mood Depressed;Anxious  Aggressive Behavior  Effect No apparent injury  Thought Process  Coherency WDL  Content WDL  Delusions None reported or observed  Perception WDL  Hallucination None reported or observed  Judgment WDL  Confusion WDL  Danger to Self  Current suicidal ideation? Denies  Danger to Others  Danger to Others None reported or observed

## 2021-09-14 NOTE — Progress Notes (Signed)
Recreation Therapy Notes   Date: 09/14/2021  Time: 10:40 am    Location: Courtyard       Behavioral response: N/A   Intervention Topic: Leisure    Discussion/Intervention: Patient refused to attend group.   Clinical Observations/Feedback:  Patient refused to attend group.    Aryana Wonnacott LRT/CTRS        [place] 09/14/2021 12:21 PM

## 2021-09-14 NOTE — BHH Suicide Risk Assessment (Signed)
[facility] Admission Suicide Risk Assessment   Nursing information obtained from:  Patient Demographic factors:  Adolescent or young adult, Caucasian Current Mental Status:  NA Loss Factors:  NA Historical Factors:  NA Risk Reduction Factors:  Living with another person, especially a relative  Total Time spent with patient: 1 hour Principal Problem: Severe recurrent major depression without psychotic features (HCC) Diagnosis:  Principal Problem:   Severe recurrent major depression without psychotic features (HCC) Active Problems:   HTN (hypertension)   Acquired hypothyroidism   HLD (hyperlipidemia)   Social anxiety disorder  Subjective Data: Patient seen and chart reviewed.  Patient seen yesterday in the ER.  Patient attended treatment team.  31 year old woman came to the emergency room stating she was having suicidal ideation and in fact stating that she had tried to choke herself earlier in the day.  Patient today says she is feeling better.  Denies suicidal thought intent or plan.  Denies psychotic symptoms.  Open and cooperative to treatment  Continued Clinical Symptoms:  Alcohol Use Disorder Identification Test Final Score (AUDIT): 0 The "Alcohol Use Disorders Identification Test", Guidelines for Use in Primary Care, Second Edition.  World Science writer [facility]). Score between 0-7:  no or low risk or alcohol related problems. Score between 8-15:  moderate risk of alcohol related problems. Score between 16-19:  high risk of alcohol related problems. Score 20 or above:  warrants further diagnostic evaluation for alcohol dependence and treatment.   CLINICAL FACTORS:   Severe Anxiety and/or Agitation Depression:   Impulsivity   Musculoskeletal: Strength & Muscle Tone: within normal limits Gait & Station: normal Patient leans: N/A  Psychiatric Specialty Exam:  Presentation  General Appearance: No data recorded Eye Contact:No data recorded Speech:No data recorded Speech  Volume:No data recorded Handedness:No data recorded  Mood and Affect  Mood:No data recorded Affect:No data recorded  Thought Process  Thought Processes:No data recorded Descriptions of Associations:No data recorded Orientation:No data recorded Thought Content:No data recorded History of Schizophrenia/Schizoaffective disorder:No  Duration of Psychotic Symptoms:No data recorded Hallucinations:No data recorded Ideas of Reference:No data recorded Suicidal Thoughts:No data recorded Homicidal Thoughts:No data recorded  Sensorium  Memory:No data recorded Judgment:No data recorded Insight:No data recorded  Executive Functions  Concentration:No data recorded Attention Span:No data recorded Recall:No data recorded Fund of Knowledge:No data recorded Language:No data recorded  Psychomotor Activity  Psychomotor Activity:No data recorded  Assets  Assets:No data recorded  Sleep  Sleep:No data recorded   Physical Exam: Physical Exam Vitals and nursing note reviewed.  Constitutional:      Appearance: Normal appearance.  HENT:     Head: Normocephalic and atraumatic.     Mouth/Throat:     Pharynx: Oropharynx is clear.  Eyes:     Pupils: Pupils are equal, round, and reactive to light.  Cardiovascular:     Rate and Rhythm: Normal rate and regular rhythm.  Pulmonary:     Effort: Pulmonary effort is normal.     Breath sounds: Normal breath sounds.  Abdominal:     General: Abdomen is flat.     Palpations: Abdomen is soft.  Musculoskeletal:        General: Normal range of motion.  Skin:    General: Skin is warm and dry.  Neurological:     General: No focal deficit present.     Mental Status: She is alert. Mental status is at baseline.  Psychiatric:        Attention and Perception: Attention normal.  Mood and Affect: Mood normal.        Speech: Speech normal.        Behavior: Behavior normal.        Thought Content: Thought content normal.        Cognition and  Memory: Cognition normal.    Review of Systems  Constitutional: Negative.   HENT: Negative.    Eyes: Negative.   Respiratory: Negative.    Cardiovascular: Negative.   Gastrointestinal: Negative.   Musculoskeletal: Negative.   Skin: Negative.   Neurological: Negative.   Psychiatric/Behavioral: Negative.     Blood pressure 130/90, pulse 84, temperature 99.5 F (37.5 C), temperature source Oral, resp. rate 20, height 5\' 5"  (1.651 m), weight 128.1 kg, SpO2 100 %. Body mass index is 47.01 kg/m.   COGNITIVE FEATURES THAT CONTRIBUTE TO RISK:  None    SUICIDE RISK:   Minimal: No identifiable suicidal ideation.  Patients presenting with no risk factors but with morbid ruminations; may be classified as minimal risk based on the severity of the depressive symptoms  PLAN OF CARE: Continue 15-minute checks.  Continue current medicine.  Engage in individual and group therapy.  Ongoing reassessment of dangerousness prior to discharge planning  I certify that inpatient services furnished can reasonably be expected to improve the patient's condition.   , MD 09/14/2021, 1:57 PM

## 2021-09-15 ENCOUNTER — Other Ambulatory Visit: Payer: Self-pay | Admitting: Internal Medicine

## 2021-09-15 DIAGNOSIS — F332 Major depressive disorder, recurrent severe without psychotic features: Secondary | ICD-10-CM | POA: Diagnosis not present

## 2021-09-15 MED ORDER — FLUOXETINE HCL 20 MG PO CAPS: 20.0000 mg | ORAL_CAPSULE | Freq: Every day | ORAL | 1 refills | Status: DC

## 2021-09-15 MED ORDER — LOSARTAN POTASSIUM 50 MG PO TABS
50.0000 mg | ORAL_TABLET | Freq: Every day | ORAL | 1 refills | Status: DC
Start: 2021-09-15 — End: 2021-10-10

## 2021-09-15 MED ORDER — LEVOTHYROXINE SODIUM 125 MCG PO TABS
125.0000 ug | ORAL_TABLET | Freq: Every day | ORAL | 1 refills | Status: DC
Start: 2021-09-16 — End: 2022-05-03

## 2021-09-15 MED ORDER — ATORVASTATIN CALCIUM 10 MG PO TABS
10.0000 mg | ORAL_TABLET | Freq: Every day | ORAL | 1 refills | Status: DC
Start: 2021-09-15 — End: 2021-10-10

## 2021-09-15 MED ORDER — TRAZODONE HCL 100 MG PO TABS: 100.0000 mg | ORAL_TABLET | Freq: Every evening | ORAL | 1 refills | Status: DC | PRN

## 2021-09-15 NOTE — Progress Notes (Signed)
  [facility] Adult Case Management Discharge Plan :  Will you be returning to the same living situation after discharge:  Yes,  pt reports that she is returning home.  At discharge, do you have transportation home?: Yes,  pt reports that her mother will provide transportation.  Do you have the ability to pay for your medications: Yes,  [employer]  Release of information consent forms completed and in the chart;  Patient's signature needed at discharge.  Patient to Follow up at:  Follow-up Information     [facility] Follow up.   Specialty: Behavioral Health Why: Your appointment is scheduled for 09/20/2021 at 9:45AM.  Please note that if you cancel or no show this appointment you are no longer eligible to see this providers office. Contact information: [address] [facility] [place] [address] [phone]                Next level of care provider has access to [facility]:yes  Safety Planning and Suicide Prevention discussed: Yes,  SPE completed with the patient's husband.      Has patient been referred to the Quitline?: N/A patient is not a smoker  Patient has been referred for addiction treatment: Pt. refused referral  Harden Mo, LCSW 09/15/2021, 11:40 AM

## 2021-09-15 NOTE — Telephone Encounter (Signed)
Requested medication (s) are due for refill today:   Prescribed today by another provider from [facility] Dr. Mordecai Rasmussen  Requested medication (s) are on the active medication list:   Yes for both  Future visit scheduled:   Yes in 5 days with Rene Kocher   Last ordered: Today 09/15/2021 #30, 1 refill each   Returned because prescribed by another provider  Requested Prescriptions  Pending Prescriptions Disp Refills   atorvastatin (LIPITOR) 10 MG tablet [Pharmacy Med Name: ATORVASTATIN 10 MG TABLET] 90 tablet     Sig: TAKE 1 TABLET BY MOUTH EVERY DAY     Cardiovascular:  Antilipid - Statins Failed - 09/15/2021 12:37 PM      Failed - Lipid Panel in normal range within the last 12 months    Cholesterol  Date Value Ref Range Status  08/02/2021 182 <200 mg/dL Final   LDL Cholesterol (Calc)  Date Value Ref Range Status  08/02/2021 87 mg/dL (calc) Final    Comment:    Reference range: <100 . Desirable range <100 mg/dL for primary prevention;   <70 mg/dL for patients with CHD or diabetic patients  with > or = 2 CHD risk factors. Marland Kitchen LDL-C is now calculated using the Martin-Hopkins  calculation, which is a validated novel method providing  better accuracy than the Friedewald equation in the  estimation of LDL-C.  Horald Pollen et al. Lenox Ahr. 5188;416(60): 2061-2068  (http://education.[employer].com/faq/FAQ164)    HDL  Date Value Ref Range Status  08/02/2021 50 > OR = 50 mg/dL Final   Triglycerides  Date Value Ref Range Status  08/02/2021 335 (H) <150 mg/dL Final    Comment:    . If a non-fasting specimen was collected, consider repeat triglyceride testing on a fasting specimen if clinically indicated.  Perry Mount et al. J. of Clin. Lipidol. 2015;9:129-169. Marland Kitchen          Passed - Patient is not pregnant      Passed - Valid encounter within last 12 months    Recent Outpatient Visits           1 month ago Need for Tdap vaccination   [facility] [place],  Salvadore Oxford, NP   4 months ago Primary hypertension   [facility] [place], Salvadore Oxford, NP   4 months ago Primary hypertension   [facility] [place], Salvadore Oxford, NP   6 months ago Primary hypertension   [facility] [place], Salvadore Oxford, NP       Future Appointments             In 5 days [place], Salvadore Oxford, NP [facility]             losartan (COZAAR) 50 MG tablet [Pharmacy Med Name: LOSARTAN POTASSIUM 50 MG TAB] 90 tablet 1    Sig: TAKE 1 TABLET BY MOUTH EVERY DAY     Cardiovascular:  Angiotensin Receptor Blockers Failed - 09/15/2021 12:37 PM      Failed - Last BP in normal range    BP Readings from Last 1 Encounters:  09/13/21 (!) 131/97         Passed - Cr in normal range and within 180 days    Creat  Date Value Ref Range Status  08/02/2021 0.60 0.50 - 0.97 mg/dL Final   Creatinine, Ser  Date Value Ref Range Status  09/13/2021 0.66 0.44 - 1.00 mg/dL Final   Creatinine, Urine  Date Value Ref Range  Status  02/19/2008 57.3 mg/dL Final         Passed - K in normal range and within 180 days    Potassium  Date Value Ref Range Status  09/13/2021 3.8 3.5 - 5.1 mmol/L Final         Passed - Patient is not pregnant      Passed - Valid encounter within last 6 months    Recent Outpatient Visits           1 month ago Need for Tdap vaccination   [facility] [place], Salvadore Oxford, NP   4 months ago Primary hypertension   [facility] [place], Salvadore Oxford, NP   4 months ago Primary hypertension   [facility] [place], Salvadore Oxford, NP   6 months ago Primary hypertension   [facility] [place], Salvadore Oxford, NP       Future Appointments             In 5 days [place], Salvadore Oxford, NP [facility], [facility]

## 2021-09-15 NOTE — Progress Notes (Signed)
Pt denies SI, HI and AVH. Pt was given belongings, AVS, transition record and prescriptions. Pt was educuated on dc plan and verbalizes understanding. Torrie Mayers RN

## 2021-09-15 NOTE — Progress Notes (Signed)
Recreation Therapy Notes  Date: 09/15/2021   Time: 10:20 am     Location: Craft room         Behavioral response: N/A   Intervention Topic: Strengths    Discussion/Intervention: Patient refused to attend group.    Clinical Observations/Feedback:  Patient refused to attend group.    Owin Vignola LRT/CTRS          Tyria Springer 09/15/2021 10:57 AM

## 2021-09-15 NOTE — BHH Suicide Risk Assessment (Signed)
[facility] Discharge Suicide Risk Assessment   Principal Problem: Severe recurrent major depression without psychotic features [facility]) Discharge Diagnoses: Principal Problem:   Severe recurrent major depression without psychotic features (HCC) Active Problems:   HTN (hypertension)   Acquired hypothyroidism   HLD (hyperlipidemia)   Social anxiety disorder   Total Time spent with patient: 30 minutes  Musculoskeletal: Strength & Muscle Tone: within normal limits Gait & Station: normal Patient leans: N/A  Psychiatric Specialty Exam  Presentation  General Appearance: No data recorded Eye Contact:No data recorded Speech:No data recorded Speech Volume:No data recorded Handedness:No data recorded  Mood and Affect  Mood:No data recorded Duration of Depression Symptoms: Greater than two weeks  Affect:No data recorded  Thought Process  Thought Processes:No data recorded Descriptions of Associations:No data recorded Orientation:No data recorded Thought Content:No data recorded History of Schizophrenia/Schizoaffective disorder:No  Duration of Psychotic Symptoms:No data recorded Hallucinations:No data recorded Ideas of Reference:No data recorded Suicidal Thoughts:No data recorded Homicidal Thoughts:No data recorded  Sensorium  Memory:No data recorded Judgment:No data recorded Insight:No data recorded  Executive Functions  Concentration:No data recorded Attention Span:No data recorded Recall:No data recorded Fund of Knowledge:No data recorded Language:No data recorded  Psychomotor Activity  Psychomotor Activity:No data recorded  Assets  Assets:No data recorded  Sleep  Sleep:No data recorded  Physical Exam: Physical Exam Vitals and nursing note reviewed.  Constitutional:      Appearance: Normal appearance.  HENT:     Head: Normocephalic and atraumatic.     Mouth/Throat:     Pharynx: Oropharynx is clear.  Eyes:     Pupils: Pupils are equal, round, and reactive to  light.  Cardiovascular:     Rate and Rhythm: Normal rate and regular rhythm.  Pulmonary:     Effort: Pulmonary effort is normal.     Breath sounds: Normal breath sounds.  Abdominal:     General: Abdomen is flat.     Palpations: Abdomen is soft.  Musculoskeletal:        General: Normal range of motion.  Skin:    General: Skin is warm and dry.  Neurological:     General: No focal deficit present.     Mental Status: She is alert. Mental status is at baseline.  Psychiatric:        Attention and Perception: Attention normal.        Mood and Affect: Mood normal.        Speech: Speech normal.        Behavior: Behavior normal.        Thought Content: Thought content normal.        Cognition and Memory: Cognition normal.        Judgment: Judgment normal.    Review of Systems  Constitutional: Negative.   HENT: Negative.    Eyes: Negative.   Respiratory: Negative.    Cardiovascular: Negative.   Gastrointestinal: Negative.   Musculoskeletal: Negative.   Skin: Negative.   Neurological: Negative.   Psychiatric/Behavioral: Negative.     Blood pressure (!) 154/102, pulse 96, temperature 98.5 F (36.9 C), temperature source Oral, resp. rate 18, height 5\' 5"  (1.651 m), weight 128.1 kg, SpO2 99 %. Body mass index is 47.01 kg/m.  Mental Status Per Nursing Assessment::   On Admission:  NA  Demographic Factors:  Caucasian  Loss Factors: Financial problems/change in socioeconomic status  Historical Factors: Impulsivity  Risk Reduction Factors:   Responsible for children under 22 years of age, Sense of responsibility to family, Positive social support, and  Positive therapeutic relationship  Continued Clinical Symptoms:  Depression:   Impulsivity  Cognitive Features That Contribute To Risk:  None    Suicide Risk:  Minimal: No identifiable suicidal ideation.  Patients presenting with no risk factors but with morbid ruminations; may be classified as minimal risk based on the  severity of the depressive symptoms   Follow-up Information     [facility] Follow up.   Specialty: Behavioral Health Why: Your appointment is scheduled for 09/20/2021 at 9:45AM.  Please note that if you cancel or no show this appointment you are no longer eligible to see this providers office. Contact information: [address] [facility] [address] [place] [address] [phone]                Plan Of Care/Follow-up recommendations:  Patient can be discharged today with follow-up recommended with [facility].  Reviewed with patient the importance of following up and reviewed with her the location of the office.  Continue current medicine.  Patient is not reporting any suicidal thoughts at all and appears upbeat and lucid.  Mordecai Rasmussen, MD 09/15/2021, 11:20 AM

## 2021-09-15 NOTE — Discharge Summary (Signed)
Physician Discharge Summary Note  Patient:  Tara Woodward is an 31 y.o., female MRN:  [id number] DOB:  06/16/1990 Patient phone:  [phone] (home)  Patient address:   [address]  [address] [place] [address],  Total Time spent with patient: 30 minutes  Date of Admission:  09/13/2021 Date of Discharge: 09/15/2021  Reason for Admission: Admitted through the emergency room where she presented after reporting depression and suicidal thoughts and attempted self-injury  Principal Problem: Severe recurrent major depression without psychotic features [facility]) Discharge Diagnoses: Principal Problem:   Severe recurrent major depression without psychotic features (HCC) Active Problems:   HTN (hypertension)   Acquired hypothyroidism   HLD (hyperlipidemia)   Social anxiety disorder   Past Psychiatric History: History of anxiety and depression with recent lack of outpatient treatment  Past Medical History:  Past Medical History:  Diagnosis Date   Depression    HTN (hypertension)    Hypothyroidism    Morbid obesity with BMI of 45.0-49.9, adult (HCC)    History reviewed. No pertinent surgical history. Family History:  Family History  Problem Relation Age of Onset   Depression Mother    Bipolar disorder Mother    Bipolar disorder Father    Healthy Sister    Hypertension Maternal Grandmother    Diabetes Maternal Grandfather    Lung cancer Maternal Grandfather    Diabetes Maternal Aunt    Breast cancer Maternal Aunt    Hypertension Maternal Aunt    Family Psychiatric  History: Depression and bipolar disorder in several family members Social History:  Social History   Substance and Sexual Activity  Alcohol Use No     Social History   Substance and Sexual Activity  Drug Use No    Social History   Socioeconomic History   Marital status: Married    Spouse name: Not on file   Number of children: Not on file   Years of education: Not on file   Highest education  level: Not on file  Occupational History   Not on file  Tobacco Use   Smoking status: Never   Smokeless tobacco: Never  Vaping Use   Vaping Use: Never used  Substance and Sexual Activity   Alcohol use: No   Drug use: No   Sexual activity: Yes    Partners: Male    Birth control/protection: I.U.D.  Other Topics Concern   Not on file  Social History Narrative   Not on file   Social Determinants of Health   Financial Resource Strain: Not on file  Food Insecurity: Not on file  Transportation Needs: Not on file  Physical Activity: Not on file  Stress: Not on file  Social Connections: Not on file    Hospital Course: Admitted to the psychiatric unit.  Patient had been started on fluoxetine 20 mg a day after I was unable to confirm whether she had been on any antidepressant previously.  Patient participated in evaluation in groups and attended treatment team.  She did not display any dangerous or suicidal behavior on the unit.  At the time of discharge patient expresses her mood as being very good.  She says she is feeling much better.  Completely denies suicidal thoughts.  Articulates multiple positive things in her life.  Patient will be given prescriptions for her current medicine and it has been emphasized to her that she needs to keep the appointment with [facility] here at the hospital because if she does not she will  not be able to reschedule given that she is already missed some appointments there.  Physical Findings: AIMS:  , ,  ,  ,    CIWA:    COWS:     Musculoskeletal: Strength & Muscle Tone: within normal limits Gait & Station: normal Patient leans: N/A   Psychiatric Specialty Exam:  Presentation  General Appearance: No data recorded Eye Contact:No data recorded Speech:No data recorded Speech Volume:No data recorded Handedness:No data recorded  Mood and Affect  Mood:No data recorded Affect:No data recorded  Thought Process  Thought  Processes:No data recorded Descriptions of Associations:No data recorded Orientation:No data recorded Thought Content:No data recorded History of Schizophrenia/Schizoaffective disorder:No  Duration of Psychotic Symptoms:No data recorded Hallucinations:No data recorded Ideas of Reference:No data recorded Suicidal Thoughts:No data recorded Homicidal Thoughts:No data recorded  Sensorium  Memory:No data recorded Judgment:No data recorded Insight:No data recorded  Executive Functions  Concentration:No data recorded Attention Span:No data recorded Recall:No data recorded Fund of Knowledge:No data recorded Language:No data recorded  Psychomotor Activity  Psychomotor Activity:No data recorded  Assets  Assets:No data recorded  Sleep  Sleep:No data recorded   Physical Exam: Physical Exam Vitals and nursing note reviewed.  Constitutional:      Appearance: Normal appearance.  HENT:     Head: Normocephalic and atraumatic.     Mouth/Throat:     Pharynx: Oropharynx is clear.  Eyes:     Pupils: Pupils are equal, round, and reactive to light.  Cardiovascular:     Rate and Rhythm: Normal rate and regular rhythm.  Pulmonary:     Effort: Pulmonary effort is normal.     Breath sounds: Normal breath sounds.  Abdominal:     General: Abdomen is flat.     Palpations: Abdomen is soft.  Musculoskeletal:        General: Normal range of motion.  Skin:    General: Skin is warm and dry.  Neurological:     General: No focal deficit present.     Mental Status: She is alert. Mental status is at baseline.  Psychiatric:        Attention and Perception: Attention normal.        Mood and Affect: Mood normal.        Speech: Speech normal.        Behavior: Behavior normal.        Thought Content: Thought content normal.        Cognition and Memory: Cognition normal.        Judgment: Judgment normal.    Review of Systems  Constitutional: Negative.   HENT: Negative.    Eyes: Negative.    Respiratory: Negative.    Cardiovascular: Negative.   Gastrointestinal: Negative.   Musculoskeletal: Negative.   Skin: Negative.   Neurological: Negative.   Psychiatric/Behavioral: Negative.     Blood pressure (!) 154/102, pulse 96, temperature 98.5 F (36.9 C), temperature source Oral, resp. rate 18, height 5\' 5"  (1.651 m), weight 128.1 kg, SpO2 99 %. Body mass index is 47.01 kg/m.   Social History   Tobacco Use  Smoking Status Never  Smokeless Tobacco Never   Tobacco Cessation:  N/A, patient does not currently use tobacco products   Blood Alcohol level:  Lab Results  Component Value Date   ETH <10 09/13/2021   ETH <10 03/05/2020    Metabolic Disorder Labs:  Lab Results  Component Value Date   HGBA1C 4.9 08/02/2021   MPG 94 08/02/2021   MPG 94 03/15/2021  Lab Results  Component Value Date   PROLACTIN 20.0 12/15/2009   Lab Results  Component Value Date   CHOL 182 08/02/2021   TRIG 335 (H) 08/02/2021   HDL 50 08/02/2021   CHOLHDL 3.6 08/02/2021   LDLCALC 87 08/02/2021   LDLCALC 199 (H) 03/15/2021    See Psychiatric Specialty Exam and Suicide Risk Assessment completed by Attending Physician prior to discharge.  Discharge destination:  Home  Is patient on multiple antipsychotic therapies at discharge:  No   Has Patient had three or more failed trials of antipsychotic monotherapy by history:  No  Recommended Plan for Multiple Antipsychotic Therapies: NA  Discharge Instructions     Diet - low sodium heart healthy   Complete by: As directed    Increase activity slowly   Complete by: As directed       Allergies as of 09/15/2021   No Known Allergies      Medication List     STOP taking these medications    acetaminophen 500 MG tablet Commonly known as: TYLENOL   citalopram 20 MG tablet Commonly known as: CELEXA       TAKE these medications      Indication  atorvastatin 10 MG tablet Commonly known as: LIPITOR Take 1 tablet (10 mg  total) by mouth daily.  Indication: High Amount of Fats in the Blood   FLUoxetine 20 MG capsule Commonly known as: PROZAC Take 1 capsule (20 mg total) by mouth daily. Start taking on: September 16, 2021  Indication: Depression   levothyroxine 125 MCG tablet Commonly known as: SYNTHROID Take 1 tablet (125 mcg total) by mouth daily at 6 (six) AM. Start taking on: September 16, 2021 What changed: when to take this  Indication: Underactive Thyroid   losartan 50 MG tablet Commonly known as: COZAAR Take 1 tablet (50 mg total) by mouth daily.  Indication: High Blood Pressure Disorder   Slynd 4 MG Tabs Generic drug: Drospirenone Take 1 tablet by mouth daily.  Indication: Birth Control Treatment   traZODone 100 MG tablet Commonly known as: DESYREL Take 1 tablet (100 mg total) by mouth at bedtime as needed for sleep.  Indication: [facility] Follow up.   Specialty: Behavioral Health Why: Your appointment is scheduled for 09/20/2021 at 9:45AM.  Please note that if you cancel or no show this appointment you are no longer eligible to see this providers office. Contact information: [place] [phone]                Follow-up recommendations: Follow-up outpatient treatment at [facility] Comments: Prescription provided  Signed: Alethia Berthold, MD 09/15/2021, 11:24 AM

## 2021-09-15 NOTE — Plan of Care (Signed)
  Problem: Education: Goal: Knowledge of General Education information will improve Description: Including pain rating scale, medication(s)/side effects and non-pharmacologic comfort measures 09/15/2021 0846 by Chalmers Cater, RN Outcome: Adequate for Discharge 09/15/2021 0845 by Chalmers Cater, RN Outcome: Adequate for Discharge   Problem: Health Behavior/Discharge Planning: Goal: Ability to manage health-related needs will improve 09/15/2021 0846 by Chalmers Cater, RN Outcome: Adequate for Discharge 09/15/2021 0845 by Chalmers Cater, RN Outcome: Adequate for Discharge   Problem: Clinical Measurements: Goal: Ability to maintain clinical measurements within normal limits will improve 09/15/2021 0846 by Chalmers Cater, RN Outcome: Adequate for Discharge 09/15/2021 0845 by Chalmers Cater, RN Outcome: Adequate for Discharge Goal: Will remain free from infection 09/15/2021 0846 by Chalmers Cater, RN Outcome: Adequate for Discharge 09/15/2021 0845 by Chalmers Cater, RN Outcome: Adequate for Discharge Goal: Diagnostic test results will improve 09/15/2021 0846 by Chalmers Cater, RN Outcome: Adequate for Discharge 09/15/2021 0845 by Chalmers Cater, RN Outcome: Adequate for Discharge Goal: Respiratory complications will improve 09/15/2021 0846 by Chalmers Cater, RN Outcome: Adequate for Discharge 09/15/2021 0845 by Chalmers Cater, RN Outcome: Adequate for Discharge Goal: Cardiovascular complication will be avoided 09/15/2021 0846 by Chalmers Cater, RN Outcome: Adequate for Discharge 09/15/2021 0845 by Chalmers Cater, RN Outcome: Adequate for Discharge   Problem: Activity: Goal: Risk for activity intolerance will decrease 09/15/2021 0846 by Chalmers Cater, RN Outcome: Adequate for Discharge 09/15/2021 0845 by Chalmers Cater, RN Outcome: Adequate for Discharge   Problem: Nutrition: Goal: Adequate nutrition will be maintained 09/15/2021 0846 by Chalmers Cater, RN Outcome:  Adequate for Discharge 09/15/2021 0845 by Chalmers Cater, RN Outcome: Adequate for Discharge   Problem: Coping: Goal: Level of anxiety will decrease 09/15/2021 0846 by Chalmers Cater, RN Outcome: Adequate for Discharge 09/15/2021 0845 by Chalmers Cater, RN Outcome: Adequate for Discharge   Problem: Elimination: Goal: Will not experience complications related to bowel motility 09/15/2021 0846 by Chalmers Cater, RN Outcome: Adequate for Discharge 09/15/2021 0845 by Chalmers Cater, RN Outcome: Adequate for Discharge Goal: Will not experience complications related to urinary retention 09/15/2021 0846 by Chalmers Cater, RN Outcome: Adequate for Discharge 09/15/2021 0845 by Chalmers Cater, RN Outcome: Adequate for Discharge   Problem: Pain Managment: Goal: General experience of comfort will improve 09/15/2021 0846 by Chalmers Cater, RN Outcome: Adequate for Discharge 09/15/2021 0845 by Chalmers Cater, RN Outcome: Adequate for Discharge   Problem: Safety: Goal: Ability to remain free from injury will improve 09/15/2021 0846 by Chalmers Cater, RN Outcome: Adequate for Discharge 09/15/2021 0845 by Chalmers Cater, RN Outcome: Adequate for Discharge   Problem: Skin Integrity: Goal: Risk for impaired skin integrity will decrease 09/15/2021 0846 by Chalmers Cater, RN Outcome: Adequate for Discharge 09/15/2021 0845 by Chalmers Cater, RN Outcome: Adequate for Discharge

## 2021-09-16 ENCOUNTER — Other Ambulatory Visit: Payer: Self-pay | Admitting: *Deleted

## 2021-09-16 NOTE — Patient Outreach (Signed)
  Care Coordination TOC Note Transition Care Management Unsuccessful Follow-up Telephone Call  Date of discharge and from where:  09/15/21 [place]  Attempts:  1st Attempt  Reason for unsuccessful TCM follow-up call:  Left voice message.  Blanchie Serve RN, BSN [facility] Care Management [facility] [phone] [email or web address]
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
## 2021-09-19 ENCOUNTER — Other Ambulatory Visit: Payer: Self-pay | Admitting: *Deleted

## 2021-09-19 NOTE — Patient Outreach (Signed)
  Care Coordination [facility] Note Transition Care Management Unsuccessful Follow-up Telephone Call  Date of discharge and from where:  09/15/21 [facility]  Attempts:  2nd Attempt  Reason for unsuccessful TCM follow-up call:  Left voice message.  Blanchie Serve RN, BSN [facility] Care Management [facility] [phone] [email or web address]

## 2021-09-20 ENCOUNTER — Encounter: Payer: 59 | Admitting: Internal Medicine

## 2021-09-20 ENCOUNTER — Encounter: Payer: Self-pay | Admitting: Psychiatry

## 2021-09-20 ENCOUNTER — Ambulatory Visit (INDEPENDENT_AMBULATORY_CARE_PROVIDER_SITE_OTHER): Payer: 59 | Admitting: Psychiatry

## 2021-09-20 ENCOUNTER — Other Ambulatory Visit: Payer: Self-pay | Admitting: *Deleted

## 2021-09-20 VITALS — BP 144/96 | HR 93 | Temp 98.1°F | Wt 287.0 lb

## 2021-09-20 DIAGNOSIS — F4010 Social phobia, unspecified: Secondary | ICD-10-CM | POA: Diagnosis not present

## 2021-09-20 DIAGNOSIS — F4310 Post-traumatic stress disorder, unspecified: Secondary | ICD-10-CM | POA: Diagnosis not present

## 2021-09-20 DIAGNOSIS — F331 Major depressive disorder, recurrent, moderate: Secondary | ICD-10-CM

## 2021-09-20 MED ORDER — LORAZEPAM 0.5 MG PO TABS
0.5000 mg | ORAL_TABLET | Freq: Every day | ORAL | 0 refills | Status: AC | PRN
Start: 2021-09-20 — End: 2021-10-20

## 2021-09-20 NOTE — Patient Instructions (Addendum)
Continue fluoxetine 20 mg daily  Start lorazepam 0.5 mg daily as needed for anxiety  Continue Trazodone 100 mg at night as needed for insomnia Please contact your insurance company for available therapist Next appointment: 8/8 at 8:30 in person   CONTACT INFORMATION  What to do if you need to get in touch with someone regarding a psychiatric issue:  1. EMERGENCY: For psychiatric emergencies (if you are suicidal or if there are any other safety issues) call 911 and/or go to your nearest Emergency Room immediately.   2. IF YOU NEED SOMEONE TO TALK TO RIGHT NOW: Given my clinical responsibilities, I may not be able to speak with you over the phone for a prolonged period of time.  A. You may always call The National Suicide Prevention Lifeline at 1-800-273-TALK [phone]).   B. You may walk in to [facility]  Address: [address] [place], [place] [address], Phone: [phone].  Open 24/7, No appointment required.  C. Your county of residence will also have local crisis services.   For [facility]:  [facility] [phone] or for TTY - contact [place] [employer] at 711) (24 Hour Crisis Hotline) Customer Service Specialists will assist you to find a crisis provider that is well-matched with your needs.   [facility] [phone])   Paulo Fruit, Fri 8 AM-2PM. [address], [facility].  You can walk in for a crisis assessment and referrals to additional services. Appointments are not needed.

## 2021-09-20 NOTE — Patient Outreach (Signed)
  Care Coordination [facility] Note Transition Care Management Follow-up Telephone Call Date of discharge and from where: 09/15/21 [facility] How have you been since you were released from the hospital? 'I am doing alright". Any questions or concerns? No  Items Reviewed: Did the pt receive and understand the discharge instructions provided? Yes  Medications obtained and verified? Yes  Other? No  Any new allergies since your discharge? No  Dietary orders reviewed? No Do you have support at home? Yes   Home Care and Equipment/Supplies: Were home health services ordered? no If so, what is the name of the agency? N/A  Has the agency set up a time to come to the patient's home? not applicable Were any new equipment or medical supplies ordered?  No What is the name of the medical supply agency? N/A Were you able to get the supplies/equipment? not applicable Do you have any questions related to the use of the equipment or supplies? No  Functional Questionnaire: (I = Independent and D = Dependent) ADLs: I  Bathing/Dressing- I  Meal Prep- I  Eating- I  Maintaining continence- I  Transferring/Ambulation- I  Managing Meds- I  Follow up appointments reviewed:  PCP Hospital f/u appt confirmed? Yes  Scheduled to see NP Baity on 10/04/21 @ 0920. Specialist Hospital f/u appt confirmed? Yes  Scheduled to see Dr. Vanetta Shawl  on 09/20/21 @ 0945 reports she had visit. Are transportation arrangements needed? No  If their condition worsens, is the pt aware to call PCP or go to the Emergency Dept.? Yes Was the patient provided with contact information for the PCP's office or ED? Yes Was to pt encouraged to call back with questions or concerns? Yes  SDOH assessments and interventions completed:   No  Care Coordination Interventions Activated:  No Care Coordination Interventions:   N/A  Encounter Outcome:  Pt. Visit Completed  Blanchie Serve RN, BSN [facility] Care Management [facility] [phone] [email or web address]

## 2021-10-04 ENCOUNTER — Ambulatory Visit (INDEPENDENT_AMBULATORY_CARE_PROVIDER_SITE_OTHER): Payer: 59 | Admitting: Internal Medicine

## 2021-10-04 ENCOUNTER — Encounter: Payer: Self-pay | Admitting: Internal Medicine

## 2021-10-04 VITALS — BP 126/84 | HR 93 | Temp 97.8°F | Wt 284.0 lb

## 2021-10-04 DIAGNOSIS — E782 Mixed hyperlipidemia: Secondary | ICD-10-CM

## 2021-10-04 DIAGNOSIS — F5104 Psychophysiologic insomnia: Secondary | ICD-10-CM

## 2021-10-04 DIAGNOSIS — I10 Essential (primary) hypertension: Secondary | ICD-10-CM

## 2021-10-04 DIAGNOSIS — K219 Gastro-esophageal reflux disease without esophagitis: Secondary | ICD-10-CM

## 2021-10-04 DIAGNOSIS — E039 Hypothyroidism, unspecified: Secondary | ICD-10-CM

## 2021-10-04 DIAGNOSIS — D75839 Thrombocytosis, unspecified: Secondary | ICD-10-CM | POA: Insufficient documentation

## 2021-10-04 DIAGNOSIS — F332 Major depressive disorder, recurrent severe without psychotic features: Secondary | ICD-10-CM | POA: Diagnosis not present

## 2021-10-04 DIAGNOSIS — G4700 Insomnia, unspecified: Secondary | ICD-10-CM | POA: Insufficient documentation

## 2021-10-04 MED ORDER — OMEPRAZOLE 20 MG PO CPDR: 20.0000 mg | DELAYED_RELEASE_CAPSULE | Freq: Every day | ORAL | 1 refills | Status: DC

## 2021-10-04 NOTE — Assessment & Plan Note (Signed)
Controlled on losartan Reinforced DASH diet and exercise for weight loss C-Met today

## 2021-10-04 NOTE — Assessment & Plan Note (Signed)
Encourage diet and exercise for weight loss 

## 2021-10-04 NOTE — Assessment & Plan Note (Signed)
CBC today.  

## 2021-10-04 NOTE — Assessment & Plan Note (Signed)
Avoid foods that trigger reflux Encourage weight loss as this can produce reflux symptoms We will trial omeprazole

## 2021-10-04 NOTE — Progress Notes (Signed)
Subjective:    Patient ID: Tara Woodward, female    DOB: 01/18/1991, 31 y.o.   MRN: [id number]  HPI  Patient presents to clinic today for follow up of chronic conditions.  HTN: Her BP today is 126/84.  She is taking Losartan as prescribed.  ECG from 02/2020 reviewed.  HLD: Her last LDL was 87, triglycerides 503, 07/2021.  She denies myalgias on Atorvastatin.  She does not consume a low-fat diet.  Hypothyroidism: She denies any issues on her current dose of Levothyroxine.  She does not follow with endocrinology.  Depression: Chronic, managed on Fluoxetine and Lorazepam.  She is currently seeing a therapist and a psychiatrist.  She denies anxiety, SI/HI.  Insomnia: She has difficulty falling asleep.  She is taking Trazodone as prescribed.  There is no sleep study on file.  Thrombocytosis: Her last platelet count was 448, 09/2021.  She does not follow with hematology.  GERD: Triggered by tomato based foods. She is not taking any cholesterol lowering medication at this time. There is no upper GI on file.   Review of Systems     Past Medical History:  Diagnosis Date   Depression    HTN (hypertension)    Hypothyroidism    Morbid obesity with BMI of 45.0-49.9, adult (HCC)     Current Outpatient Medications  Medication Sig Dispense Refill   atorvastatin (LIPITOR) 10 MG tablet Take 1 tablet (10 mg total) by mouth daily. 30 tablet 1   Drospirenone (SLYND) 4 MG TABS Take 1 tablet by mouth daily. 84 tablet 3   FLUoxetine (PROZAC) 20 MG capsule Take 1 capsule (20 mg total) by mouth daily. 30 capsule 1   levothyroxine (SYNTHROID) 125 MCG tablet Take 1 tablet (125 mcg total) by mouth daily at 6 (six) AM. 30 tablet 1   LORazepam (ATIVAN) 0.5 MG tablet Take 1 tablet (0.5 mg total) by mouth daily as needed (extreme anxiety). 30 tablet 0   losartan (COZAAR) 50 MG tablet Take 1 tablet (50 mg total) by mouth daily. 30 tablet 1   traZODone (DESYREL) 100 MG tablet Take 1 tablet (100 mg total) by  mouth at bedtime as needed for sleep. 30 tablet 1   No current facility-administered medications for this visit.    No Known Allergies  Family History  Problem Relation Age of Onset   Drug abuse Mother    Depression Mother    Bipolar disorder Mother    Alcohol abuse Father    Drug abuse Father    Bipolar disorder Father    Healthy Sister    Diabetes Maternal Aunt    Breast cancer Maternal Aunt    Hypertension Maternal Aunt    Diabetes Maternal Grandfather    Lung cancer Maternal Grandfather    Hypertension Maternal Grandmother     Social History   Socioeconomic History   Marital status: Married    Spouse name: Dorene Sorrow   Number of children: 2   Years of education: Not on file   Highest education level: High school graduate  Occupational History   Not on file  Tobacco Use   Smoking status: Never   Smokeless tobacco: Never  Vaping Use   Vaping Use: Never used  Substance and Sexual Activity   Alcohol use: No   Drug use: No   Sexual activity: Yes    Partners: Male    Birth control/protection: I.U.D.  Other Topics Concern   Not on file  Social History Narrative   Not on  file   Social Determinants of Health   Financial Resource Strain: Not on file  Food Insecurity: Not on file  Transportation Needs: Not on file  Physical Activity: Not on file  Stress: Not on file  Social Connections: Not on file  Intimate Partner Violence: Not on file     Constitutional: Denies fever, malaise, fatigue, headache or abrupt weight changes.  HEENT: Denies eye pain, eye redness, ear pain, ringing in the ears, wax buildup, runny nose, nasal congestion, bloody nose, or sore throat. Respiratory: Denies difficulty breathing, shortness of breath, cough or sputum production.   Cardiovascular: Denies chest pain, chest tightness, palpitations or swelling in the hands or feet.  Gastrointestinal: Denies abdominal pain, bloating, constipation, diarrhea or blood in the stool.  GU: Denies  urgency, frequency, pain with urination, burning sensation, blood in urine, odor or discharge. Musculoskeletal: Denies decrease in range of motion, difficulty with gait, muscle pain or joint pain and swelling.  Skin: Denies redness, rashes, lesions or ulcercations.  Neurological: Patient reports insomnia.  Denies dizziness, difficulty with memory, difficulty with speech or problems with balance and coordination.  Psych: Patient has a history of depression.  Denies anxiety, SI/HI.  No other specific complaints in a complete review of systems (except as listed in HPI above).  Objective:   Physical Exam BP 126/84 (BP Location: Left Arm, Patient Position: Sitting, Cuff Size: Large)   Pulse 93   Temp 97.8 F (36.6 C) (Temporal)   Wt 284 lb (128.8 kg)   SpO2 97%   BMI 47.26 kg/m   Wt Readings from Last 3 Encounters:  09/13/21 290 lb (131.5 kg)  08/02/21 290 lb (131.5 kg)  07/27/21 290 lb (131.5 kg)    General: Appears her stated age, obese, in NAD. Skin: Warm, dry and intact.  HEENT: Head: normal shape and size; Eyes: sclera white, no icterus, conjunctiva pink, PERRLA and EOMs intact;  Neck:  Neck supple, trachea midline. No masses, lumps or thyromegaly present.  Cardiovascular: Normal rate and rhythm. S1,S2 noted.  No murmur, rubs or gallops noted. No JVD or BLE edema. N Pulmonary/Chest: Normal effort and positive vesicular breath sounds. No respiratory distress. No wheezes, rales or ronchi noted.  Abdomen: Normal bowel sounds.  Musculoskeletal: No difficulty with gait.  Neurological: Alert and oriented.  Psychiatric: Mood and affect normal. Behavior is normal. Judgment and thought content normal.    BMET    Component Value Date/Time   NA 137 09/13/2021 0848   K 3.8 09/13/2021 0848   CL 107 09/13/2021 0848   CO2 24 09/13/2021 0848   GLUCOSE 91 09/13/2021 0848   BUN 10 09/13/2021 0848   CREATININE 0.66 09/13/2021 0848   CREATININE 0.60 08/02/2021 1341   CALCIUM 9.1  09/13/2021 0848   GFRNONAA >60 09/13/2021 0848   GFRAA >60 12/09/2019 0551    Lipid Panel     Component Value Date/Time   CHOL 182 08/02/2021 1341   TRIG 335 (H) 08/02/2021 1341   HDL 50 08/02/2021 1341   CHOLHDL 3.6 08/02/2021 1341   LDLCALC 87 08/02/2021 1341    CBC    Component Value Date/Time   WBC 6.6 09/13/2021 0848   RBC 4.83 09/13/2021 0848   HGB 13.6 09/13/2021 0848   HCT 40.3 09/13/2021 0848   PLT 448 (H) 09/13/2021 0848   MCV 83.4 09/13/2021 0848   MCH 28.2 09/13/2021 0848   MCHC 33.7 09/13/2021 0848   RDW 12.6 09/13/2021 0848   LYMPHSABS 2.2 03/05/2020 1542  MONOABS 0.5 03/05/2020 1542   EOSABS 0.0 03/05/2020 1542   BASOSABS 0.0 03/05/2020 1542    Hgb A1C Lab Results  Component Value Date   HGBA1C 4.9 08/02/2021            Assessment & Plan:     Nicki Reaper, NP

## 2021-10-04 NOTE — Assessment & Plan Note (Signed)
TSH and free T4 today We will adjust levothyroxine if needed based on labs 

## 2021-10-04 NOTE — Assessment & Plan Note (Signed)
Continue fluoxetine and lorazepam as prescribed by psychiatry She will continue meet with her therapist Support offered

## 2021-10-04 NOTE — Assessment & Plan Note (Signed)
Continue trazodone We will monitor 

## 2021-10-04 NOTE — Assessment & Plan Note (Signed)
C-Met and lipid profile today Encouraged her to consume a low-fat diet Continue atorvastatin 

## 2021-10-04 NOTE — Patient Instructions (Signed)

## 2021-10-05 LAB — CBC
HCT: 38.5 % (ref 35.0–45.0)
Hemoglobin: 13.2 g/dL (ref 11.7–15.5)
MCH: 29.3 pg (ref 27.0–33.0)
MCHC: 34.3 g/dL (ref 32.0–36.0)
MCV: 85.4 fL (ref 80.0–100.0)
MPV: 8.4 fL (ref 7.5–12.5)
Platelets: 429 10*3/uL — ABNORMAL HIGH (ref 140–400)
RBC: 4.51 10*6/uL (ref 3.80–5.10)
RDW: 13.8 % (ref 11.0–15.0)
WBC: 7.3 10*3/uL (ref 3.8–10.8)

## 2021-10-05 LAB — T4, FREE: Free T4: 1 ng/dL (ref 0.8–1.8)

## 2021-10-05 LAB — COMPLETE METABOLIC PANEL WITH GFR
AG Ratio: 1.5 (calc) (ref 1.0–2.5)
ALT: 12 U/L (ref 6–29)
AST: 14 U/L (ref 10–30)
Albumin: 4.3 g/dL (ref 3.6–5.1)
Alkaline phosphatase (APISO): 91 U/L (ref 31–125)
BUN: 11 mg/dL (ref 7–25)
CO2: 23 mmol/L (ref 20–32)
Calcium: 9.1 mg/dL (ref 8.6–10.2)
Chloride: 102 mmol/L (ref 98–110)
Creat: 0.59 mg/dL (ref 0.50–0.97)
Globulin: 2.9 g/dL (calc) (ref 1.9–3.7)
Glucose, Bld: 88 mg/dL (ref 65–99)
Potassium: 3.8 mmol/L (ref 3.5–5.3)
Sodium: 136 mmol/L (ref 135–146)
Total Bilirubin: 0.6 mg/dL (ref 0.2–1.2)
Total Protein: 7.2 g/dL (ref 6.1–8.1)
eGFR: 123 mL/min/{1.73_m2} (ref >=60)

## 2021-10-05 LAB — LIPID PANEL
Cholesterol: 190 mg/dL (ref <200)
HDL: 46 mg/dL — ABNORMAL LOW (ref >=50)
LDL Cholesterol (Calc): 112 mg/dL (calc) — ABNORMAL HIGH
Non-HDL Cholesterol (Calc): 144 mg/dL (calc) — ABNORMAL HIGH (ref <130)
Total CHOL/HDL Ratio: 4.1 (calc) (ref <5.0)
Triglycerides: 204 mg/dL — ABNORMAL HIGH (ref <150)

## 2021-10-05 LAB — TSH: TSH: 5.39 mIU/L — ABNORMAL HIGH
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
## 2021-10-08 ENCOUNTER — Other Ambulatory Visit: Payer: Self-pay | Admitting: Internal Medicine

## 2021-10-10 NOTE — Telephone Encounter (Signed)
Requested Prescriptions  Pending Prescriptions Disp Refills  . atorvastatin (LIPITOR) 10 MG tablet [Pharmacy Med Name: ATORVASTATIN 10 MG TABLET] 90 tablet 1    Sig: TAKE 1 TABLET BY MOUTH EVERY DAY     Cardiovascular:  Antilipid - Statins Failed - 10/08/2021  8:08 AM      Failed - Lipid Panel in normal range within the last 12 months    Cholesterol  Date Value Ref Range Status  10/04/2021 190 <200 mg/dL Final   LDL Cholesterol (Calc)  Date Value Ref Range Status  10/04/2021 112 (H) mg/dL (calc) Final    Comment:    Reference range: <100 . Desirable range <100 mg/dL for primary prevention;   <70 mg/dL for patients with CHD or diabetic patients  with > or = 2 CHD risk factors. Marland Kitchen LDL-C is now calculated using the Martin-Hopkins  calculation, which is a validated novel method providing  better accuracy than the Friedewald equation in the  estimation of LDL-C.  Horald Pollen et al. Lenox Ahr. 4580;998(33): 2061-2068  (http://education.[employer].com/faq/FAQ164)    HDL  Date Value Ref Range Status  10/04/2021 46 (L) > OR = 50 mg/dL Final   Triglycerides  Date Value Ref Range Status  10/04/2021 204 (H) <150 mg/dL Final    Comment:    . If a non-fasting specimen was collected, consider repeat triglyceride testing on a fasting specimen if clinically indicated.  Perry Mount et al. J. of Clin. Lipidol. 2015;9:129-169. Marland Kitchen          Passed - Patient is not pregnant      Passed - Valid encounter within last 12 months    Recent Outpatient Visits          6 days ago Mixed hyperlipidemia   [facility] [place], [place] W, NP   2 months ago Need for Tdap vaccination   [facility] [place], Salvadore Oxford, NP   4 months ago Primary hypertension   [facility] [place], Salvadore Oxford, NP   5 months ago Primary hypertension   [facility] [place], Salvadore Oxford, NP   6 months ago Primary hypertension   [facility] [place], Salvadore Oxford, NP       Future Appointments            In 6 months Baity, Salvadore Oxford, NP [facility]           . losartan (COZAAR) 50 MG tablet [Pharmacy Med Name: LOSARTAN POTASSIUM 50 MG TAB] 90 tablet 1    Sig: TAKE 1 TABLET BY MOUTH EVERY DAY     Cardiovascular:  Angiotensin Receptor Blockers Passed - 10/08/2021  8:08 AM      Passed - Cr in normal range and within 180 days    Creat  Date Value Ref Range Status  10/04/2021 0.59 0.50 - 0.97 mg/dL Final   Creatinine, Urine  Date Value Ref Range Status  02/19/2008 57.3 mg/dL Final         Passed - K in normal range and within 180 days    Potassium  Date Value Ref Range Status  10/04/2021 3.8 3.5 - 5.3 mmol/L Final         Passed - Patient is not pregnant      Passed - Last BP in normal range    BP Readings from Last 1 Encounters:  10/04/21 126/84         Passed - Valid encounter within last 6 months  Recent Outpatient Visits          6 days ago Mixed hyperlipidemia   [facility] [place], Salvadore Oxford, NP   2 months ago Need for Tdap vaccination   [facility] [place], Salvadore Oxford, NP   4 months ago Primary hypertension   [facility] [place], Salvadore Oxford, NP   5 months ago Primary hypertension   [facility] [place], Salvadore Oxford, NP   6 months ago Primary hypertension   [facility] [place], Salvadore Oxford, NP      Future Appointments            In 6 months Baity, Salvadore Oxford, NP [facility], [facility]

## 2021-10-11 ENCOUNTER — Encounter: Payer: Self-pay | Admitting: Psychiatry

## 2021-10-11 ENCOUNTER — Ambulatory Visit (INDEPENDENT_AMBULATORY_CARE_PROVIDER_SITE_OTHER): Payer: 59 | Admitting: Psychiatry

## 2021-10-11 VITALS — BP 137/88 | HR 85 | Temp 97.8°F | Wt 288.6 lb

## 2021-10-11 DIAGNOSIS — G4700 Insomnia, unspecified: Secondary | ICD-10-CM | POA: Diagnosis not present

## 2021-10-11 DIAGNOSIS — F4010 Social phobia, unspecified: Secondary | ICD-10-CM

## 2021-10-11 DIAGNOSIS — F4310 Post-traumatic stress disorder, unspecified: Secondary | ICD-10-CM

## 2021-10-11 DIAGNOSIS — F3341 Major depressive disorder, recurrent, in partial remission: Secondary | ICD-10-CM | POA: Diagnosis not present

## 2021-10-11 MED ORDER — FLUOXETINE HCL 40 MG PO CAPS: 40.0000 mg | ORAL_CAPSULE | Freq: Every day | ORAL | 1 refills | Status: DC

## 2021-10-11 MED ORDER — TRAZODONE HCL 100 MG PO TABS: 100.0000 mg | ORAL_TABLET | Freq: Every day | ORAL | 0 refills | Status: DC

## 2021-10-19 ENCOUNTER — Ambulatory Visit: Payer: Medicaid Other | Admitting: Family Medicine

## 2021-11-03 ENCOUNTER — Other Ambulatory Visit: Payer: Self-pay | Admitting: Psychiatry

## 2021-11-25 ENCOUNTER — Other Ambulatory Visit: Payer: Self-pay | Admitting: Internal Medicine

## 2021-11-25 NOTE — Telephone Encounter (Signed)
Requested by interface surescripts . Medication dose discontinued 06/24/21.  Requested Prescriptions  Refused Prescriptions Disp Refills  . levothyroxine (SYNTHROID) 100 MCG tablet [Pharmacy Med Name: LEVOTHYROXINE 100 MCG TABLET] 30 tablet 0    Sig: TAKE 1 TABLET BY MOUTH EVERY DAY     Endocrinology:  Hypothyroid Agents Failed - 11/25/2021  9:10 AM      Failed - TSH in normal range and within 360 days    TSH  Date Value Ref Range Status  10/04/2021 5.39 (H) mIU/L Final    Comment:              Reference Range .           > or = 20 Years  0.40-4.50 .                Pregnancy Ranges           First trimester    0.26-2.66           Second trimester   0.55-2.73           Third trimester    0.43-2.91          Passed - Valid encounter within last 12 months    Recent Outpatient Visits          1 month ago Mixed hyperlipidemia   [place], Coralie Keens, NP   3 months ago Need for Tdap vaccination   [facility] [place], Coralie Keens, NP   6 months ago Primary hypertension   [place], Coralie Keens, NP   7 months ago Primary hypertension   [facility] [place], Coralie Keens, NP   8 months ago Primary hypertension   [facility] [place], Coralie Keens, NP      Future Appointments            In 4 months Baity, Coralie Keens, NP [facility], [facility]
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
## 2021-11-29 ENCOUNTER — Ambulatory Visit (INDEPENDENT_AMBULATORY_CARE_PROVIDER_SITE_OTHER): Payer: 59 | Admitting: Psychiatry

## 2021-11-29 ENCOUNTER — Encounter: Payer: Self-pay | Admitting: Psychiatry

## 2021-11-29 VITALS — BP 113/77 | HR 74 | Temp 98.3°F | Ht 65.0 in | Wt 286.0 lb

## 2021-11-29 DIAGNOSIS — G4700 Insomnia, unspecified: Secondary | ICD-10-CM | POA: Diagnosis not present

## 2021-11-29 DIAGNOSIS — R69 Illness, unspecified: Secondary | ICD-10-CM | POA: Diagnosis not present

## 2021-11-29 DIAGNOSIS — F4010 Social phobia, unspecified: Secondary | ICD-10-CM | POA: Diagnosis not present

## 2021-11-29 DIAGNOSIS — F3341 Major depressive disorder, recurrent, in partial remission: Secondary | ICD-10-CM

## 2021-11-29 DIAGNOSIS — F4310 Post-traumatic stress disorder, unspecified: Secondary | ICD-10-CM | POA: Diagnosis not present

## 2021-11-29 MED ORDER — TRAZODONE HCL 100 MG PO TABS: 100.0000 mg | ORAL_TABLET | Freq: Every day | ORAL | 0 refills | Status: DC

## 2021-11-29 NOTE — Patient Instructions (Addendum)
Continue fluoxetine 40 mg daily  Continue lorazepam 0.5 mg daily as needed for anxiety  Continue Trazodone 100 mg at night as needed for insomnia Please contact insurance company for available therapist Next appointment: 11/6 at 10:30, in person

## 2021-12-06 ENCOUNTER — Other Ambulatory Visit: Payer: Self-pay | Admitting: Psychiatry

## 2021-12-23 ENCOUNTER — Other Ambulatory Visit: Payer: Self-pay | Admitting: Internal Medicine

## 2021-12-23 NOTE — Telephone Encounter (Signed)
Requested Prescriptions  Pending Prescriptions Disp Refills  . levothyroxine (SYNTHROID) 100 MCG tablet [Pharmacy Med Name: LEVOTHYROXINE 100 MCG TABLET] 30 tablet 0    Sig: TAKE 1 TABLET BY MOUTH EVERY DAY     Endocrinology:  Hypothyroid Agents Failed - 12/23/2021  8:52 AM      Failed - TSH in normal range and within 360 days    TSH  Date Value Ref Range Status  10/04/2021 5.39 (H) mIU/L Final    Comment:              Reference Range .           > or = 20 Years  0.40-4.50 .                Pregnancy Ranges           First trimester    0.26-2.66           Second trimester   0.55-2.73           Third trimester    0.43-2.91          Passed - Valid encounter within last 12 months    Recent Outpatient Visits          2 months ago Mixed hyperlipidemia   [facility], Coralie Keens, NP   4 months ago Need for Tdap vaccination   [facility] [place], Coralie Keens, NP   7 months ago Primary hypertension   [facility] [place], Coralie Keens, NP   8 months ago Primary hypertension   [facility] [place], Coralie Keens, NP   9 months ago Primary hypertension   [facility] [place], Coralie Keens, NP      Future Appointments            In 3 months Ba[place], Coralie Keens, NP [facility], [facility]
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
## 2022-01-09 ENCOUNTER — Ambulatory Visit (INDEPENDENT_AMBULATORY_CARE_PROVIDER_SITE_OTHER): Payer: 59 | Admitting: Psychiatry

## 2022-01-09 ENCOUNTER — Encounter: Payer: Self-pay | Admitting: Psychiatry

## 2022-01-09 VITALS — BP 137/86 | HR 86 | Temp 97.7°F | Ht 65.0 in | Wt 281.4 lb

## 2022-01-09 DIAGNOSIS — F3341 Major depressive disorder, recurrent, in partial remission: Secondary | ICD-10-CM

## 2022-01-09 DIAGNOSIS — G4700 Insomnia, unspecified: Secondary | ICD-10-CM | POA: Diagnosis not present

## 2022-01-09 DIAGNOSIS — F4310 Post-traumatic stress disorder, unspecified: Secondary | ICD-10-CM

## 2022-01-09 DIAGNOSIS — F4010 Social phobia, unspecified: Secondary | ICD-10-CM

## 2022-01-09 DIAGNOSIS — R69 Illness, unspecified: Secondary | ICD-10-CM | POA: Diagnosis not present

## 2022-01-09 MED ORDER — TRAZODONE HCL 100 MG PO TABS
100.0000 mg | ORAL_TABLET | Freq: Every day | ORAL | 0 refills | Status: DC
Start: 2022-01-09 — End: 2022-03-20

## 2022-01-09 MED ORDER — FLUOXETINE HCL 40 MG PO CAPS
40.0000 mg | ORAL_CAPSULE | Freq: Every day | ORAL | 0 refills | Status: DC
Start: 2022-02-02 — End: 2022-03-20

## 2022-01-09 NOTE — Patient Instructions (Signed)
Continue fluoxetine 40 mg daily  Continue lorazepam 0.5 mg daily as needed for anxiety  Continue Trazodone 100 mg at night as needed for insomnia Advised her to contact insurance company for available therapist Next appointment: 11/6 at 10:30

## 2022-01-21 ENCOUNTER — Other Ambulatory Visit: Payer: Self-pay | Admitting: Internal Medicine

## 2022-01-23 NOTE — Telephone Encounter (Signed)
Dose changed. Requested Prescriptions  Pending Prescriptions Disp Refills   levothyroxine (SYNTHROID) 100 MCG tablet [Pharmacy Med Name: LEVOTHYROXINE 100 MCG TABLET] 30 tablet 0    Sig: TAKE 1 TABLET BY MOUTH EVERY DAY     Endocrinology:  Hypothyroid Agents Failed - 01/21/2022  8:29 AM      Failed - TSH in normal range and within 360 days    TSH  Date Value Ref Range Status  10/04/2021 5.39 (H) mIU/L Final    Comment:              Reference Range .           > or = 20 Years  0.40-4.50 .                Pregnancy Ranges           First trimester    0.26-2.66           Second trimester   0.55-2.73           Third trimester    0.43-2.91          Passed - Valid encounter within last 12 months    Recent Outpatient Visits           3 months ago Mixed hyperlipidemia   [facility] [place], Salvadore Oxford, NP   5 months ago Need for Tdap vaccination   [facility] [place], Salvadore Oxford, NP   8 months ago Primary hypertension   [facility] [place], Salvadore Oxford, NP   9 months ago Primary hypertension   [facility] [place], Salvadore Oxford, NP   10 months ago Primary hypertension   [facility] [place], Salvadore Oxford, NP       Future Appointments             In 2 months Baity, Salvadore Oxford, NP [facility], [facility]

## 2022-01-30 ENCOUNTER — Other Ambulatory Visit: Payer: Self-pay | Admitting: Internal Medicine

## 2022-01-30 NOTE — Telephone Encounter (Signed)
Medication Refill - Medication: bp medication (pt does not know the name)  Has the patient contacted their pharmacy? Yes.    (Agent: If yes, when and what did the pharmacy advise?) contact provider  Preferred Pharmacy (with phone number or street name):  [phone] [place], [place] - [address] Phone: [phone]  Fax: [phone]     Has the patient been seen for an appointment in the last year OR does the patient have an upcoming appointment? Yes.    Agent: Please be advised that RX refills may take up to 3 business days. We ask that you follow-up with your pharmacy.

## 2022-01-31 NOTE — Telephone Encounter (Signed)
Refilled 10/10/2021 #[id number] rf. Requested Prescriptions  Pending Prescriptions Disp Refills   losartan (COZAAR) 50 MG tablet 90 tablet 1    Sig: Take 1 tablet (50 mg total) by mouth daily.     Cardiovascular:  Angiotensin Receptor Blockers Passed - 01/30/2022 11:55 AM      Passed - Cr in normal range and within 180 days    Creat  Date Value Ref Range Status  10/04/2021 0.59 0.50 - 0.97 mg/dL Final   Creatinine, Urine  Date Value Ref Range Status  02/19/2008 57.3 mg/dL Final         Passed - K in normal range and within 180 days    Potassium  Date Value Ref Range Status  10/04/2021 3.8 3.5 - 5.3 mmol/L Final         Passed - Patient is not pregnant      Passed - Last BP in normal range    BP Readings from Last 1 Encounters:  10/04/21 126/84         Passed - Valid encounter within last 6 months    Recent Outpatient Visits           3 months ago Mixed hyperlipidemia   [facility] [place], Salvadore Oxford, NP   6 months ago Need for Tdap vaccination   [id number] [place], Salvadore Oxford, NP   8 months ago Primary hypertension   [facility] [place], Salvadore Oxford, NP   9 months ago Primary hypertension   [facility] [place], Salvadore Oxford, NP   10 months ago Primary hypertension   [facility] [place], Salvadore Oxford, NP       Future Appointments             In 2 months Baity, Salvadore Oxford, NP [facility], [facility]

## 2022-01-31 NOTE — Telephone Encounter (Signed)
Called pharmacy pt has refill available. Pharm will prepare refill.

## 2022-02-03 DIAGNOSIS — Z419 Encounter for procedure for purposes other than remedying health state, unspecified: Secondary | ICD-10-CM | POA: Diagnosis not present

## 2022-02-05 ENCOUNTER — Other Ambulatory Visit: Payer: Self-pay | Admitting: Psychiatry

## 2022-02-10 ENCOUNTER — Ambulatory Visit: Payer: 59 | Admitting: Licensed Clinical Social Worker

## 2022-03-02 ENCOUNTER — Other Ambulatory Visit: Payer: Self-pay | Admitting: Internal Medicine

## 2022-03-02 NOTE — Telephone Encounter (Signed)
Unable to refill per protocol, request is too soon.  Requested Prescriptions  Pending Prescriptions Disp Refills   atorvastatin (LIPITOR) 10 MG tablet [Pharmacy Med Name: ATORVASTATIN 10 MG TABLET] 90 tablet 1    Sig: TAKE 1 TABLET BY MOUTH EVERY DAY     Cardiovascular:  Antilipid - Statins Failed - 03/02/2022 10:32 AM      Failed - Lipid Panel in normal range within the last 12 months    Cholesterol  Date Value Ref Range Status  10/04/2021 190 <200 mg/dL Final   LDL Cholesterol (Calc)  Date Value Ref Range Status  10/04/2021 112 (H) mg/dL (calc) Final    Comment:    Reference range: <100 . Desirable range <100 mg/dL for primary prevention;   <70 mg/dL for patients with CHD or diabetic patients  with > or = 2 CHD risk factors. Marland Kitchen LDL-C is now calculated using the Martin-Hopkins  calculation, which is a validated novel method providing  better accuracy than the Friedewald equation in the  estimation of LDL-C.  Horald Pollen et al. Lenox Ahr. 1791;505(69): 2061-2068  (http://education.Quest[facility].com/faq/FAQ164)    HDL  Date Value Ref Range Status  10/04/2021 46 (L) > OR = 50 mg/dL Final   Triglycerides  Date Value Ref Range Status  10/04/2021 204 (H) <150 mg/dL Final    Comment:    . If a non-fasting specimen was collected, consider repeat triglyceride testing on a fasting specimen if clinically indicated.  Perry Mount et al. J. of Clin. Lipidol. 2015;9:129-169. Marland Kitchen          Passed - Patient is not pregnant      Passed - Valid encounter within last 12 months    Recent Outpatient Visits           4 months ago Mixed hyperlipidemia   [facility] [place], Salvadore Oxford, NP   7 months ago Need for Tdap vaccination   [facility] [place], Salvadore Oxford, NP   9 months ago Primary hypertension   [facility] [place], Salvadore Oxford, NP   10 months ago Primary hypertension   [facility] [place], Salvadore Oxford, NP   11 months ago Primary  hypertension   [facility] [place], Salvadore Oxford, NP       Future Appointments             In 1 month Baity, Salvadore Oxford, NP [facility], [facility]             omeprazole (PRILOSEC) 20 MG capsule [Pharmacy Med Name: OMEPRAZOLE DR 20 MG CAPSULE] 90 capsule 1    Sig: TAKE 1 CAPSULE BY MOUTH EVERY DAY     Gastroenterology: Proton Pump Inhibitors Passed - 03/02/2022 10:32 AM      Passed - Valid encounter within last 12 months    Recent Outpatient Visits           4 months ago Mixed hyperlipidemia   [facility] [place], Salvadore Oxford, NP   7 months ago Need for Tdap vaccination   [facility] [place], Salvadore Oxford, NP   9 months ago Primary hypertension   [facility] [place], Salvadore Oxford, NP   10 months ago Primary hypertension   [facility] [place], Salvadore Oxford, NP   11 months ago Primary hypertension   [facility] [place], Salvadore Oxford, NP       Future Appointments  In 1 month Baity, Salvadore Oxford, NP [facility], [facility]

## 2022-03-06 DIAGNOSIS — Z419 Encounter for procedure for purposes other than remedying health state, unspecified: Secondary | ICD-10-CM | POA: Diagnosis not present

## 2022-03-07 ENCOUNTER — Ambulatory Visit: Payer: 59 | Admitting: Licensed Clinical Social Worker

## 2022-03-11 ENCOUNTER — Other Ambulatory Visit: Payer: Self-pay | Admitting: Internal Medicine

## 2022-03-13 NOTE — Telephone Encounter (Signed)
Requested by interface surescripts. Medication dose discontinued 04/19/21. Dose increased.  Requested Prescriptions  Refused Prescriptions Disp Refills   losartan (COZAAR) 25 MG tablet [Pharmacy Med Name: LOSARTAN POTASSIUM 25 MG TAB] 30 tablet 0    Sig: TAKE 1 TABLET (25 MG TOTAL) BY MOUTH DAILY.     Cardiovascular:  Angiotensin Receptor Blockers Passed - 03/11/2022  8:20 AM      Passed - Cr in normal range and within 180 days    Creat  Date Value Ref Range Status  10/04/2021 0.59 0.50 - 0.97 mg/dL Final   Creatinine, Urine  Date Value Ref Range Status  02/19/2008 57.3 mg/dL Final         Passed - K in normal range and within 180 days    Potassium  Date Value Ref Range Status  10/04/2021 3.8 3.5 - 5.3 mmol/L Final         Passed - Patient is not pregnant      Passed - Last BP in normal range    BP Readings from Last 1 Encounters:  10/04/21 126/84         Passed - Valid encounter within last 6 months    Recent Outpatient Visits           5 months ago Mixed hyperlipidemia   [place], Coralie Keens, NP   7 months ago Need for Tdap vaccination   [facility] [place], Coralie Keens, NP   10 months ago Primary hypertension   [facility] [place], Coralie Keens, NP   10 months ago Primary hypertension   [facility] [place], Coralie Keens, NP   12 months ago Primary hypertension   [facility] [place], Coralie Keens, NP       Future Appointments             In 4 weeks Garnette Gunner, Coralie Keens, NP [facility], [facility]
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
## 2022-03-20 ENCOUNTER — Ambulatory Visit (INDEPENDENT_AMBULATORY_CARE_PROVIDER_SITE_OTHER): Payer: 59 | Admitting: Psychiatry

## 2022-03-20 ENCOUNTER — Encounter: Payer: Self-pay | Admitting: Psychiatry

## 2022-03-20 VITALS — BP 130/85 | HR 91 | Temp 97.9°F | Ht 65.0 in | Wt 283.6 lb

## 2022-03-20 DIAGNOSIS — F4010 Social phobia, unspecified: Secondary | ICD-10-CM | POA: Diagnosis not present

## 2022-03-20 DIAGNOSIS — G4700 Insomnia, unspecified: Secondary | ICD-10-CM | POA: Diagnosis not present

## 2022-03-20 DIAGNOSIS — F3341 Major depressive disorder, recurrent, in partial remission: Secondary | ICD-10-CM | POA: Diagnosis not present

## 2022-03-20 DIAGNOSIS — F4310 Post-traumatic stress disorder, unspecified: Secondary | ICD-10-CM | POA: Diagnosis not present

## 2022-03-20 DIAGNOSIS — R69 Illness, unspecified: Secondary | ICD-10-CM | POA: Diagnosis not present

## 2022-03-20 MED ORDER — FLUOXETINE HCL 40 MG PO CAPS: 40.0000 mg | ORAL_CAPSULE | Freq: Every day | ORAL | 0 refills | Status: DC

## 2022-03-20 MED ORDER — TRAZODONE HCL 100 MG PO TABS: 100.0000 mg | ORAL_TABLET | Freq: Every day | ORAL | 0 refills | Status: DC

## 2022-03-20 NOTE — Patient Instructions (Signed)
Continue fluoxetine 40 mg daily  Discontinue lorazepam  Continue Trazodone 100 mg at night as needed for insomnia Next appointment: 3/26 at 4pm

## 2022-03-22 ENCOUNTER — Institutional Professional Consult (permissible substitution): Payer: Medicaid Other | Admitting: Internal Medicine

## 2022-03-24 ENCOUNTER — Other Ambulatory Visit: Payer: Self-pay | Admitting: Internal Medicine

## 2022-03-24 NOTE — Telephone Encounter (Signed)
Dc'd 04/19/21 Webb Silversmith NP   Requested Prescriptions  Refused Prescriptions Disp Refills   losartan (COZAAR) 25 MG tablet [Pharmacy Med Name: LOSARTAN POTASSIUM 25 MG TAB] 30 tablet 0    Sig: TAKE 1 TABLET (25 MG TOTAL) BY MOUTH DAILY.     Cardiovascular:  Angiotensin Receptor Blockers Passed - 03/24/2022  9:16 AM      Passed - Cr in normal range and within 180 days    Creat  Date Value Ref Range Status  10/04/2021 0.59 0.50 - 0.97 mg/dL Final   Creatinine, Urine  Date Value Ref Range Status  02/19/2008 57.3 mg/dL Final         Passed - K in normal range and within 180 days    Potassium  Date Value Ref Range Status  10/04/2021 3.8 3.5 - 5.3 mmol/L Final         Passed - Patient is not pregnant      Passed - Last BP in normal range    BP Readings from Last 1 Encounters:  10/04/21 126/84         Passed - Valid encounter within last 6 months    Recent Outpatient Visits           5 months ago Mixed hyperlipidemia   [place], Coralie Keens, NP   7 months ago Need for Tdap vaccination   [facility] [place], Coralie Keens, NP   10 months ago Primary hypertension   [facility] [place], Coralie Keens, NP   11 months ago Primary hypertension   [facility] [place], Coralie Keens, NP   1 year ago Primary hypertension   [facility] [place], Coralie Keens, NP       Future Appointments             In 2 weeks Garnette Gunner, Coralie Keens, NP [facility], [facility]

## 2022-03-28 ENCOUNTER — Ambulatory Visit: Payer: 59 | Admitting: Licensed Clinical Social Worker

## 2022-04-05 ENCOUNTER — Other Ambulatory Visit: Payer: Self-pay | Admitting: Internal Medicine

## 2022-04-05 NOTE — Telephone Encounter (Signed)
Requested Prescriptions  Pending Prescriptions Disp Refills   atorvastatin (LIPITOR) 10 MG tablet [Pharmacy Med Name: ATORVASTATIN 10 MG TABLET] 90 tablet 1    Sig: TAKE 1 TABLET BY MOUTH EVERY DAY     Cardiovascular:  Antilipid - Statins Failed - 04/05/2022  8:30 AM      Failed - Lipid Panel in normal range within the last 12 months    Cholesterol  Date Value Ref Range Status  10/04/2021 190 <200 mg/dL Final   LDL Cholesterol (Calc)  Date Value Ref Range Status  10/04/2021 112 (H) mg/dL (calc) Final    Comment:    Reference range: <100 . Desirable range <100 mg/dL for primary prevention;   <70 mg/dL for patients with CHD or diabetic patients  with > or = 2 CHD risk factors. Marland Kitchen LDL-C is now calculated using the Martin-Hopkins  calculation, which is a validated novel method providing  better accuracy than the Friedewald equation in the  estimation of LDL-C.  Cresenciano Genre et al. Annamaria Helling. 2725;366(44): 2061-2068  (http://education.[facility].com/faq/FAQ164)    HDL  Date Value Ref Range Status  10/04/2021 46 (L) > OR = 50 mg/dL Final   Triglycerides  Date Value Ref Range Status  10/04/2021 204 (H) <150 mg/dL Final    Comment:    . If a non-fasting specimen was collected, consider repeat triglyceride testing on a fasting specimen if clinically indicated.  Yates Decamp et al. J. of Clin. Lipidol. 0347;4:259-563. Marland Kitchen          Passed - Patient is not pregnant      Passed - Valid encounter within last 12 months    Recent Outpatient Visits           6 months ago Mixed hyperlipidemia   [facility] [place], Coralie Keens, NP   8 months ago Need for Tdap vaccination   [facility] [place], Coralie Keens, NP   10 months ago Primary hypertension   [facility] [place], Coralie Keens, NP   11 months ago Primary hypertension   [facility] [place], Coralie Keens, NP   1 year ago Primary hypertension    [facility] [place], Coralie Keens, NP       Future Appointments             In 6 days [place], Coralie Keens, NP [facility], [facility]

## 2022-04-06 DIAGNOSIS — Z419 Encounter for procedure for purposes other than remedying health state, unspecified: Secondary | ICD-10-CM | POA: Diagnosis not present

## 2022-04-11 ENCOUNTER — Encounter: Payer: Self-pay | Admitting: Internal Medicine

## 2022-04-11 ENCOUNTER — Ambulatory Visit: Payer: 59 | Admitting: Internal Medicine

## 2022-04-11 VITALS — BP 128/68 | HR 85 | Temp 96.7°F | Wt 284.0 lb

## 2022-04-11 DIAGNOSIS — E782 Mixed hyperlipidemia: Secondary | ICD-10-CM

## 2022-04-11 DIAGNOSIS — R69 Illness, unspecified: Secondary | ICD-10-CM | POA: Diagnosis not present

## 2022-04-11 DIAGNOSIS — Z6841 Body Mass Index (BMI) 40.0 and over, adult: Secondary | ICD-10-CM

## 2022-04-11 DIAGNOSIS — E559 Vitamin D deficiency, unspecified: Secondary | ICD-10-CM | POA: Diagnosis not present

## 2022-04-11 DIAGNOSIS — R7309 Other abnormal glucose: Secondary | ICD-10-CM | POA: Diagnosis not present

## 2022-04-11 DIAGNOSIS — K219 Gastro-esophageal reflux disease without esophagitis: Secondary | ICD-10-CM | POA: Diagnosis not present

## 2022-04-11 DIAGNOSIS — G5793 Unspecified mononeuropathy of bilateral lower limbs: Secondary | ICD-10-CM | POA: Diagnosis not present

## 2022-04-11 DIAGNOSIS — R739 Hyperglycemia, unspecified: Secondary | ICD-10-CM

## 2022-04-11 DIAGNOSIS — I10 Essential (primary) hypertension: Secondary | ICD-10-CM | POA: Diagnosis not present

## 2022-04-11 DIAGNOSIS — D75839 Thrombocytosis, unspecified: Secondary | ICD-10-CM | POA: Diagnosis not present

## 2022-04-11 DIAGNOSIS — E039 Hypothyroidism, unspecified: Secondary | ICD-10-CM | POA: Diagnosis not present

## 2022-04-11 DIAGNOSIS — F332 Major depressive disorder, recurrent severe without psychotic features: Secondary | ICD-10-CM

## 2022-04-11 DIAGNOSIS — E66813 Obesity, class 3: Secondary | ICD-10-CM

## 2022-04-11 DIAGNOSIS — A630 Anogenital (venereal) warts: Secondary | ICD-10-CM

## 2022-04-11 DIAGNOSIS — F5104 Psychophysiologic insomnia: Secondary | ICD-10-CM

## 2022-04-11 NOTE — Assessment & Plan Note (Signed)
TSH and free T4 today We will adjust levothyroxine if needed based on labs 

## 2022-04-11 NOTE — Assessment & Plan Note (Signed)
CBC today.  

## 2022-04-11 NOTE — Assessment & Plan Note (Signed)
Controlled on losartan Reinforced DASH diet and exercise for weight loss C-Met today 

## 2022-04-11 NOTE — Assessment & Plan Note (Signed)
Encourage diet and exercise for weight loss 

## 2022-04-11 NOTE — Patient Instructions (Signed)

## 2022-04-11 NOTE — Assessment & Plan Note (Signed)
Currently not an issue 

## 2022-04-11 NOTE — Progress Notes (Signed)
Subjective:    Patient ID: Tara Woodward, female    DOB: December 16, 1990, 32 y.o.   MRN: [id number]  HPI  Patient presents to clinic today for follow-up of chronic conditions.  HTN: Her BP today is 128/68.  She is taking Losartan as prescribed.  ECG from 02/2020 reviewed.  HLD: Her last LDL was 112, triglycerides 204, 10/2021.  She denies myalgias on Atorvastatin.  She does not consume low-fat diet.  Hypothyroidism: She denies any issues on her current dose of Levothyroxine.  She does not follow with endocrinology.  Depression: Chronic, managed on Fluoxetine.  She is currently seeing a therapist and psychiatrist.  She denies anxiety, SI/HI.  Insomnia: She has difficulty falling asleep.  She is taking Trazodone as prescribed.  There is no sleep study on file.  Thrombocytosis: Her last platelet count was 429, 10/2021.  She does not follow with hematology.  GERD: Triggered by tomato-based foods.  She does have breakthrough on Omeprazole.  She takes Tums or Rolaids as needed with some relief of symptoms. There is no upper GI on file.  Review of Systems     Past Medical History:  Diagnosis Date   Depression    HTN (hypertension)    Hypothyroidism    Morbid obesity with BMI of 45.0-49.9, adult (HCC)     Current Outpatient Medications  Medication Sig Dispense Refill   atorvastatin (LIPITOR) 10 MG tablet TAKE 1 TABLET BY MOUTH EVERY DAY 90 tablet 1   Drospirenone (SLYND) 4 MG TABS Take 1 tablet by mouth daily. 84 tablet 3   [START ON 05/03/2022] FLUoxetine (PROZAC) 40 MG capsule Take 1 capsule (40 mg total) by mouth daily. 90 capsule 0   levothyroxine (SYNTHROID) 125 MCG tablet Take 1 tablet (125 mcg total) by mouth daily at 6 (six) AM. 30 tablet 1   losartan (COZAAR) 50 MG tablet TAKE 1 TABLET BY MOUTH EVERY DAY 90 tablet 1   omeprazole (PRILOSEC) 20 MG capsule Take 1 capsule (20 mg total) by mouth daily. 90 capsule 1   traZODone (DESYREL) 100 MG tablet Take 1 tablet (100 mg total) by  mouth at bedtime. 90 tablet 0   No current facility-administered medications for this visit.    No Known Allergies  Family History  Problem Relation Age of Onset   Drug abuse Mother    Depression Mother    Bipolar disorder Mother    Alcohol abuse Father    Drug abuse Father    Bipolar disorder Father    Healthy Sister    Diabetes Maternal Aunt    Breast cancer Maternal Aunt    Hypertension Maternal Aunt    Diabetes Maternal Grandfather    Lung cancer Maternal Grandfather    Hypertension Maternal Grandmother     Social History   Socioeconomic History   Marital status: Married    Spouse name: Sonia Side   Number of children: 2   Years of education: Not on file   Highest education level: High school graduate  Occupational History   Not on file  Tobacco Use   Smoking status: Never   Smokeless tobacco: Never  Vaping Use   Vaping Use: Never used  Substance and Sexual Activity   Alcohol use: No   Drug use: No   Sexual activity: Yes    Partners: Male    Birth control/protection: I.U.D.  Other Topics Concern   Not on file  Social History Narrative   Not on file   Social Determinants of Health  Financial Resource Strain: Not on file  Food Insecurity: Not on file  Transportation Needs: Not on file  Physical Activity: Not on file  Stress: Not on file  Social Connections: Not on file  Intimate Partner Violence: Not on file     Constitutional: Denies fever, malaise, fatigue, headache or abrupt weight changes.  HEENT: Denies eye pain, eye redness, ear pain, ringing in the ears, wax buildup, runny nose, nasal congestion, bloody nose, or sore throat. Respiratory: Denies difficulty breathing, shortness of breath, cough or sputum production.   Cardiovascular: Denies chest pain, chest tightness, palpitations or swelling in the hands or feet.  Gastrointestinal: Pt reports difficulty swallowing. Denies abdominal pain, bloating, constipation, diarrhea or blood in the stool.   GU: Denies urgency, frequency, pain with urination, burning sensation, blood in urine, odor or discharge. Musculoskeletal: Denies decrease in range of motion, difficulty with gait, muscle pain or joint pain and swelling.  Skin: Denies redness, rashes, lesions or ulcercations.  Neurological: Patient reports insomnia, burning sensation in feet.  Denies dizziness, difficulty with memory, difficulty with speech or problems with balance and coordination.  Psych: Patient has a history of depression.  Denies anxiety, SI/HI.  No other specific complaints in a complete review of systems (except as listed in HPI above).   Objective:   Physical Exam   BP 128/68 (BP Location: Left Arm, Patient Position: Sitting, Cuff Size: Large)   Pulse 85   Temp (!) 96.7 F (35.9 C) (Temporal)   Wt 284 lb (128.8 kg)   SpO2 99%   BMI 47.26 kg/m   Wt Readings from Last 3 Encounters:  10/04/21 284 lb (128.8 kg)  09/13/21 290 lb (131.5 kg)  08/02/21 290 lb (131.5 kg)    General: Appears her stated age, obese, in NAD. Skin: Warm, dry and intact.  HEENT: Head: normal shape and size; Eyes: sclera white, no icterus, conjunctiva pink, PERRLA and EOMs intact;  Neck:  Neck supple, trachea midline. No masses, lumps or thyromegaly present.  Cardiovascular: Normal rate and rhythm. S1,S2 noted.  No murmur, rubs or gallops noted. No JVD or BLE edema.  Pulmonary/Chest: Normal effort and positive vesicular breath sounds. No respiratory distress. No wheezes, rales or ronchi noted.  Abdomen: Soft and nontender. Normal bowel sounds.  Musculoskeletal:  No difficulty with gait.  Neurological: Alert and oriented.  Coordination normal.  Psychiatric: Mood and affect normal. Behavior is normal. Judgment and thought content normal.    BMET    Component Value Date/Time   NA 136 10/04/2021 0912   K 3.8 10/04/2021 0912   CL 102 10/04/2021 0912   CO2 23 10/04/2021 0912   GLUCOSE 88 10/04/2021 0912   BUN 11 10/04/2021 0912    CREATININE 0.59 10/04/2021 0912   CALCIUM 9.1 10/04/2021 0912   GFRNONAA >60 09/13/2021 0848   GFRAA >60 12/09/2019 0551    Lipid Panel     Component Value Date/Time   CHOL 190 10/04/2021 0912   TRIG 204 (H) 10/04/2021 0912   HDL 46 (L) 10/04/2021 0912   CHOLHDL 4.1 10/04/2021 0912   LDLCALC 112 (H) 10/04/2021 0912    CBC    Component Value Date/Time   WBC 7.3 10/04/2021 0912   RBC 4.51 10/04/2021 0912   HGB 13.2 10/04/2021 0912   HCT 38.5 10/04/2021 0912   PLT 429 (H) 10/04/2021 0912   MCV 85.4 10/04/2021 0912   MCH 29.3 10/04/2021 0912   MCHC 34.3 10/04/2021 0912   RDW 13.8 10/04/2021 0912  LYMPHSABS 2.2 03/05/2020 1542   MONOABS 0.5 03/05/2020 1542   EOSABS 0.0 03/05/2020 1542   BASOSABS 0.0 03/05/2020 1542    Hgb A1C Lab Results  Component Value Date   HGBA1C 4.9 08/02/2021           Assessment & Plan:   Neuropathic Pain in Feet:  Will check TSH, vitamin D, B12 and A1c Could be exacerbated by her weight  RTC in 6 months for your annual exam Webb Silversmith, NP

## 2022-04-11 NOTE — Assessment & Plan Note (Signed)
Continue trazodone per psychiatry 

## 2022-04-11 NOTE — Assessment & Plan Note (Signed)
C-Met and lipid profile today Encouraged to consume low-fat diet Continue atorvastatin 

## 2022-04-11 NOTE — Assessment & Plan Note (Signed)
Continue fluoxetine per psychiatry Support offered

## 2022-04-11 NOTE — Assessment & Plan Note (Signed)
Avoid foods that trigger reflux Encourage weight loss as this can produce reflux symptoms Continue omeprazole, Tums and Rolaids Consider referral to GI if has persistent difficulty swallowing

## 2022-04-12 LAB — LIPID PANEL
Cholesterol: 182 mg/dL (ref <200)
HDL: 55 mg/dL (ref >=50)
LDL Cholesterol (Calc): 98 mg/dL (calc)
Non-HDL Cholesterol (Calc): 127 mg/dL (calc) (ref <130)
Total CHOL/HDL Ratio: 3.3 (calc) (ref <5.0)
Triglycerides: 196 mg/dL — ABNORMAL HIGH (ref <150)

## 2022-04-12 LAB — COMPLETE METABOLIC PANEL WITH GFR
AG Ratio: 1.4 (calc) (ref 1.0–2.5)
ALT: 8 U/L (ref 6–29)
AST: 11 U/L (ref 10–30)
Albumin: 4.4 g/dL (ref 3.6–5.1)
Alkaline phosphatase (APISO): 81 U/L (ref 31–125)
BUN: 16 mg/dL (ref 7–25)
CO2: 27 mmol/L (ref 20–32)
Calcium: 9.3 mg/dL (ref 8.6–10.2)
Chloride: 103 mmol/L (ref 98–110)
Creat: 0.58 mg/dL (ref 0.50–0.97)
Globulin: 3.1 g/dL (calc) (ref 1.9–3.7)
Glucose, Bld: 89 mg/dL (ref 65–99)
Potassium: 4.1 mmol/L (ref 3.5–5.3)
Sodium: 139 mmol/L (ref 135–146)
Total Bilirubin: 0.4 mg/dL (ref 0.2–1.2)
Total Protein: 7.5 g/dL (ref 6.1–8.1)
eGFR: 122 mL/min/{1.73_m2} (ref >=60)

## 2022-04-12 LAB — CBC
HCT: 37.4 % (ref 35.0–45.0)
Hemoglobin: 12.6 g/dL (ref 11.7–15.5)
MCH: 28.8 pg (ref 27.0–33.0)
MCHC: 33.7 g/dL (ref 32.0–36.0)
MCV: 85.6 fL (ref 80.0–100.0)
MPV: 9.1 fL (ref 7.5–12.5)
Platelets: 482 10*3/uL — ABNORMAL HIGH (ref 140–400)
RBC: 4.37 10*6/uL (ref 3.80–5.10)
RDW: 12.7 % (ref 11.0–15.0)
WBC: 6.2 10*3/uL (ref 3.8–10.8)

## 2022-04-12 LAB — TSH: TSH: 11.35 mIU/L — ABNORMAL HIGH

## 2022-04-12 LAB — T4, FREE: Free T4: 0.9 ng/dL (ref 0.8–1.8)

## 2022-04-12 LAB — HEMOGLOBIN A1C
Hgb A1c MFr Bld: 5 % of total Hgb (ref <5.7)
Mean Plasma Glucose: 97 mg/dL
eAG (mmol/L): 5.4 mmol/L

## 2022-04-12 LAB — VITAMIN D 25 HYDROXY (VIT D DEFICIENCY, FRACTURES): Vit D, 25-Hydroxy: 16 ng/mL — ABNORMAL LOW (ref 30–100)

## 2022-04-12 LAB — VITAMIN B12: Vitamin B-12: 370 pg/mL (ref 200–1100)

## 2022-04-12 MED ORDER — VITAMIN D (ERGOCALCIFEROL) 1.25 MG (50000 UNIT) PO CAPS: 50000.0000 [IU] | ORAL_CAPSULE | ORAL | 0 refills | Status: DC

## 2022-04-12 NOTE — Addendum Note (Signed)
Addended by: Jearld Fenton on: 04/12/2022 10:03 AM   Modules accepted: Orders

## 2022-04-25 ENCOUNTER — Other Ambulatory Visit: Payer: Self-pay | Admitting: Internal Medicine

## 2022-04-26 NOTE — Telephone Encounter (Signed)
Requested medication (s) are due for refill today - yes  Requested medication (s) are on the active medication list -yes  Future visit scheduled -no  Last refill: 09/16/21 #30  1RF  Notes to clinic: Last lab abnormal- patient has not been notified- has not read MyChart message- recommended change dose- is she taking?  Requested Prescriptions  Pending Prescriptions Disp Refills   levothyroxine (SYNTHROID) 125 MCG tablet [Pharmacy Med Name: LEVOTHYROXINE 125 MCG TABLET] 90 tablet 1    Sig: TAKE 1 TABLET BY MOUTH EVERY DAY     Endocrinology:  Hypothyroid Agents Failed - 04/25/2022  7:09 PM      Failed - TSH in normal range and within 360 days    TSH  Date Value Ref Range Status  04/11/2022 11.35 (H) mIU/L Final    Comment:              Reference Range .           > or = 20 Years  0.40-4.50 .                Pregnancy Ranges           First trimester    0.26-2.66           Second trimester   0.55-2.73           Third trimester    0.43-2.91          Passed - Valid encounter within last 12 months    Recent Outpatient Visits           2 weeks ago Gastroesophageal reflux disease without esophagitis   [facility] [place], Coralie Keens, NP   6 months ago Mixed hyperlipidemia   [facility] [place], Coralie Keens, NP   8 months ago Need for Tdap vaccination   [facility] [place], Coralie Keens, NP   11 months ago Primary hypertension   [facility] [place], Coralie Keens, NP   1 year ago Primary hypertension   [facility] [place], Coralie Keens, NP       Future Appointments             In 5 months Baity, Coralie Keens, NP [facility]               Requested Prescriptions  Pending Prescriptions Disp Refills   levothyroxine (SYNTHROID) 125 MCG tablet [Pharmacy Med Name: LEVOTHYROXINE 125 MCG TABLET] 90 tablet 1    Sig: TAKE 1 TABLET BY  MOUTH EVERY DAY     Endocrinology:  Hypothyroid Agents Failed - 04/25/2022  7:09 PM      Failed - TSH in normal range and within 360 days    TSH  Date Value Ref Range Status  04/11/2022 11.35 (H) mIU/L Final    Comment:              Reference Range .           > or = 20 Years  0.40-4.50 .                Pregnancy Ranges           First trimester    0.26-2.66           Second trimester   0.55-2.73           Third trimester  0.43-2.91          Passed - Valid encounter within last 12 months    Recent Outpatient Visits           2 weeks ago Gastroesophageal reflux disease without esophagitis   [facility] [place], Coralie Keens, NP   6 months ago Mixed hyperlipidemia   [facility], Coralie Keens, NP   8 months ago Need for Tdap vaccination   [facility] [place], Coralie Keens, NP   11 months ago Primary hypertension   [facility] [place], Coralie Keens, NP   1 year ago Primary hypertension   [facility] [place], Coralie Keens, NP       Future Appointments             In 5 months Baity, Coralie Keens, NP [facility], [place]              s

## 2022-05-02 ENCOUNTER — Ambulatory Visit: Payer: Self-pay | Admitting: *Deleted

## 2022-05-02 DIAGNOSIS — E039 Hypothyroidism, unspecified: Secondary | ICD-10-CM

## 2022-05-02 NOTE — Telephone Encounter (Signed)
Pt. Calling in regarding her Synthroid.    I went to [facility] for [facility] refill but they would not give me a refill because y'all had not authorized it.   Reason for Disposition  [1] Caller has URGENT medicine question about med that PCP or specialist prescribed AND [2] triager unable to answer question  Answer Assessment - Initial Assessment Questions 1. NAME of MEDICINE: "What medicine(s) are you calling about?"     Synthroid 2. QUESTION: "What is your question?" (e.g., double dose of medicine, side effect)     [facility] not [facility] because it has not been [facility] by my provider. 3. PRESCRIBER: "Who prescribed the medicine?" Reason: if prescribed by specialist, call should be referred to that group.     Webb Silversmith, NP 4. SYMPTOMS: "Do you have any symptoms?" If Yes, ask: "What symptoms are you having?"  "How bad are the symptoms (e.g., mild, moderate, severe)     I'm out of my thyroid medication. 5. PREGNANCY:  "Is there any chance that you are pregnant?" "When was your last menstrual period?"     Not asked  I read her the message from Webb Silversmith, NP dated 04/12/2022 at 10:03 AM.   No one had contacted her with this message.   Rollene Fare wanting to increase her Synthroid medication to 150 mcg and start her on a rx strength vitamin D.  I have sent a message to Webb Silversmith, NP that these need to be called in.  Pt thanked me for my help.   She is out of her thyroid medication.  Protocols used: Medication Question Call-A-AH

## 2022-05-02 NOTE — Telephone Encounter (Signed)
  Chief Complaint: Synthroid 150 mcg (new dose) has not been called into the pharmacy nor has the rx strength vitamin D.   Symptoms: Pt was not notified of the message from Webb Silversmith, NP regarding her lab results dated 04/12/2022. Frequency: She is out of her thyroid medication. Pertinent Negatives: Patient denies anyone calling her with the results.   Disposition: []$ ED /[]$ Urgent Care (no appt availability in office) / []$ Appointment(In office/virtual)/ []$  [place] [facility]/ []$ Home Care/ []$ Refused Recommended Disposition /[]$ [place] [facility]/ [x]$  Follow-up with PCP Additional Notes: Message sent to [place] for Webb Silversmith, NP

## 2022-05-03 MED ORDER — LEVOTHYROXINE SODIUM 150 MCG PO TABS: 150.0000 ug | ORAL_TABLET | Freq: Every day | ORAL | 0 refills | Status: DC

## 2022-05-03 NOTE — Telephone Encounter (Signed)
Apt 06/07/2022 at 3:45pm.  Pt advised.   Thanks,   -Mickel Baas

## 2022-05-03 NOTE — Telephone Encounter (Signed)
Ergocalciferol was sent to the pharmacy 04/12/2022.  I have sent in levothyroxine 150 mcg.  She needs a lab only appointment in 1 month to repeat TSH.

## 2022-05-03 NOTE — Addendum Note (Signed)
Addended by: Jearld Fenton on: 05/03/2022 07:52 AM   Modules accepted: Orders

## 2022-05-05 DIAGNOSIS — Z419 Encounter for procedure for purposes other than remedying health state, unspecified: Secondary | ICD-10-CM | POA: Diagnosis not present
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
## 2022-05-30 ENCOUNTER — Ambulatory Visit: Payer: 59 | Admitting: Psychiatry

## 2022-06-05 DIAGNOSIS — Z419 Encounter for procedure for purposes other than remedying health state, unspecified: Secondary | ICD-10-CM | POA: Diagnosis not present

## 2022-06-06 ENCOUNTER — Other Ambulatory Visit: Payer: Self-pay

## 2022-06-06 DIAGNOSIS — E039 Hypothyroidism, unspecified: Secondary | ICD-10-CM

## 2022-06-07 ENCOUNTER — Other Ambulatory Visit: Payer: 59

## 2022-06-25 ENCOUNTER — Other Ambulatory Visit: Payer: Self-pay | Admitting: Internal Medicine

## 2022-06-25 ENCOUNTER — Other Ambulatory Visit: Payer: Self-pay | Admitting: Psychiatry

## 2022-06-26 NOTE — Telephone Encounter (Signed)
Requested Prescriptions  Pending Prescriptions Disp Refills   losartan (COZAAR) 50 MG tablet [Pharmacy Med Name: LOSARTAN POTASSIUM 50 MG TAB] 90 tablet 1    Sig: TAKE 1 TABLET BY MOUTH EVERY DAY     Cardiovascular:  Angiotensin Receptor Blockers Passed - 06/25/2022  7:43 PM      Passed - Cr in normal range and within 180 days    Creat  Date Value Ref Range Status  04/11/2022 0.58 0.50 - 0.97 mg/dL Final   Creatinine, Urine  Date Value Ref Range Status  02/19/2008 57.3 mg/dL Final         Passed - K in normal range and within 180 days    Potassium  Date Value Ref Range Status  04/11/2022 4.1 3.5 - 5.3 mmol/L Final         Passed - Patient is not pregnant      Passed - Last BP in normal range    BP Readings from Last 1 Encounters:  04/11/22 128/68         Passed - Valid encounter within last 6 months    Recent Outpatient Visits           2 months ago Gastroesophageal reflux disease without esophagitis   [facility] [place], Salvadore Oxford, NP   8 months ago Mixed hyperlipidemia   [facility] [place], Salvadore Oxford, NP   10 months ago Need for Tdap vaccination   [facility] [place], Salvadore Oxford, NP   1 year ago Primary hypertension   [facility] [place], Salvadore Oxford, NP   1 year ago Primary hypertension   [facility] [place], Salvadore Oxford, NP       Future Appointments             In 3 months Baity, Salvadore Oxford, NP [place] [facility] [place], [facility]             levothyroxine (SYNTHROID) 125 MCG tablet [Pharmacy Med Name: LEVOTHYROXINE 125 MCG TABLET] 90 tablet 1    Sig: TAKE 1 TABLET BY MOUTH EVERY DAY     Endocrinology:  Hypothyroid Agents Failed - 06/25/2022  7:43 PM      Failed - TSH in normal range and within 360 days    TSH  Date Value Ref Range Status  04/11/2022 11.35 (H) mIU/L Final    Comment:              Reference Range .            > or = 20 Years  0.40-4.50 .                Pregnancy Ranges           First trimester    0.26-2.66           Second trimester   0.55-2.73           Third trimester    0.43-2.91          Passed - Valid encounter within last 12 months    Recent Outpatient Visits           2 months ago Gastroesophageal reflux disease without esophagitis   [facility] [place], Salvadore Oxford, NP   8 months ago Mixed hyperlipidemia   [place] [facility] [place], Salvadore Oxford, NP   10 months ago  Need for Tdap vaccination   [facility] [place], Salvadore Oxford, NP   1 year ago Primary hypertension   [place] [facility] [place], Salvadore Oxford, NP   1 year ago Primary hypertension   [place] [facility] [place], Salvadore Oxford, NP       Future Appointments             In 3 months Baity, Salvadore Oxford, NP [place] [facility], [facility]

## 2022-06-26 NOTE — Telephone Encounter (Signed)
Rx dose was increased on 05/03/22 #90/0
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
## 2022-07-04 ENCOUNTER — Ambulatory Visit (INDEPENDENT_AMBULATORY_CARE_PROVIDER_SITE_OTHER): Payer: 59 | Admitting: Psychiatry

## 2022-07-04 ENCOUNTER — Encounter: Payer: Self-pay | Admitting: Psychiatry

## 2022-07-04 VITALS — BP 120/75 | HR 82 | Temp 97.4°F | Ht 65.0 in | Wt 273.4 lb

## 2022-07-04 DIAGNOSIS — G4700 Insomnia, unspecified: Secondary | ICD-10-CM

## 2022-07-04 DIAGNOSIS — F3342 Major depressive disorder, recurrent, in full remission: Secondary | ICD-10-CM

## 2022-07-04 DIAGNOSIS — F4310 Post-traumatic stress disorder, unspecified: Secondary | ICD-10-CM | POA: Diagnosis not present

## 2022-07-04 DIAGNOSIS — F4010 Social phobia, unspecified: Secondary | ICD-10-CM | POA: Diagnosis not present

## 2022-07-05 DIAGNOSIS — Z419 Encounter for procedure for purposes other than remedying health state, unspecified: Secondary | ICD-10-CM | POA: Diagnosis not present

## 2022-07-10 ENCOUNTER — Encounter (HOSPITAL_COMMUNITY): Payer: Self-pay

## 2022-07-15 ENCOUNTER — Other Ambulatory Visit: Payer: Self-pay | Admitting: Psychiatry

## 2022-07-18 ENCOUNTER — Other Ambulatory Visit: Payer: Self-pay | Admitting: Internal Medicine

## 2022-07-18 NOTE — Telephone Encounter (Unsigned)
Copied from CRM [id number]. Topic: General - Other >> Jul 18, 2022  5:18 PM Everette C wrote: Reason for CRM: Medication Refill - Medication: FLUoxetine (PROZAC) 40 MG capsule [[id number]]  Has the patient contacted their pharmacy? Yes.   (Agent: If no, request that the patient contact the pharmacy for the refill. If patient does not wish to contact the pharmacy document the reason why and proceed with request.) (Agent: If yes, when and what did the pharmacy advise?)  Preferred Pharmacy (with phone number or street name): [phone] [facility], [place] - [address] [place] [address] Phone: [phone] Fax: [phone] Hours: Not open 24 hours   Has the patient been seen for an appointment in the last year OR does the patient have an upcoming appointment? Yes.    Agent: Please be advised that RX refills may take up to 3 business days. We ask that you follow-up with your pharmacy.

## 2022-07-18 NOTE — Telephone Encounter (Signed)
Requested medication (s) are due for refill today: yes  Requested medication (s) are on the active medication list: yes  Last refill:  05/03/22 #90/0  Future visit scheduled: yes  Notes to clinic:  Unable to refill per protocol, last refill by [facility] provider.    Requested Prescriptions  Pending Prescriptions Disp Refills   FLUoxetine (PROZAC) 40 MG capsule 90 capsule 0    Sig: Take 1 capsule (40 mg total) by mouth daily.     Psychiatry:  Antidepressants - SSRI Passed - 07/18/2022  5:28 PM      Passed - Completed PHQ-2 or PHQ-9 in the last 360 days      Passed - Valid encounter within last 6 months    Recent Outpatient Visits           3 months ago Gastroesophageal reflux disease without esophagitis   [place] [place], Salvadore Oxford, NP   9 months ago Mixed hyperlipidemia   [place] [place], Salvadore Oxford, NP   11 months ago Need for Tdap vaccination   [place] [facility] [place], Salvadore Oxford, NP   1 year ago Primary hypertension   [place] [facility] [place], Salvadore Oxford, NP   1 year ago Primary hypertension   [place] [facility] [place], Salvadore Oxford, NP       Future Appointments             In 2 months Baity, Salvadore Oxford, NP [place] [facility] [place], [facility]

## 2022-07-19 MED ORDER — FLUOXETINE HCL 40 MG PO CAPS: 40.0000 mg | ORAL_CAPSULE | Freq: Every day | ORAL | 0 refills | Status: DC

## 2022-07-28 ENCOUNTER — Other Ambulatory Visit: Payer: Self-pay | Admitting: Internal Medicine

## 2022-08-01 NOTE — Telephone Encounter (Signed)
Requested Prescriptions  Pending Prescriptions Disp Refills   levothyroxine (SYNTHROID) 150 MCG tablet [Pharmacy Med Name: LEVOTHYROXINE 150 MCG TABLET] 30 tablet 2    Sig: TAKE 1 TABLET BY MOUTH EVERY DAY     Endocrinology:  Hypothyroid Agents Failed - 07/28/2022  7:31 PM      Failed - TSH in normal range and within 360 days    TSH  Date Value Ref Range Status  04/11/2022 11.35 (H) mIU/L Final    Comment:              Reference Range .           > or = 20 Years  0.40-4.50 .                Pregnancy Ranges           First trimester    0.26-2.66           Second trimester   0.55-2.73           Third trimester    0.43-2.91          Passed - Valid encounter within last 12 months    Recent Outpatient Visits           3 months ago Gastroesophageal reflux disease without esophagitis   [facility] [place], Salvadore Oxford, NP   10 months ago Mixed hyperlipidemia   [place] [place], Salvadore Oxford, NP   12 months ago Need for Tdap vaccination   [place] [facility], Salvadore Oxford, NP   1 year ago Primary hypertension   [facility] [place], Salvadore Oxford, NP   1 year ago Primary hypertension   [place] [facility] [place], Salvadore Oxford, NP       Future Appointments             In 2 months Baity, Salvadore Oxford, NP [place] [facility]             levothyroxine (SYNTHROID) 125 MCG tablet [Pharmacy Med Name: LEVOTHYROXINE 125 MCG TABLET] 90 tablet 0    Sig: TAKE 1 TABLET BY MOUTH EVERY DAY     Endocrinology:  Hypothyroid Agents Failed - 07/28/2022  7:31 PM      Failed - TSH in normal range and within 360 days    TSH  Date Value Ref Range Status  04/11/2022 11.35 (H) mIU/L Final    Comment:              Reference Range .           > or = 20 Years  0.40-4.50 .                Pregnancy Ranges           First trimester    0.26-2.66           Second  trimester   0.55-2.73           Third trimester    0.43-2.91          Passed - Valid encounter within last 12 months    Recent Outpatient Visits           3 months ago Gastroesophageal reflux disease without esophagitis   [place] [facility], Salvadore Oxford, NP   10 months ago Mixed hyperlipidemia   [place] [facility] [place], Salvadore Oxford, NP   12 months ago Need for Tdap vaccination   [place] [facility] [place], Salvadore Oxford, NP   1 year ago Primary hypertension   [facility] [facility] [place], Salvadore Oxford, NP   1 year ago Primary hypertension   [place] [facility] [place], Salvadore Oxford, NP       Future Appointments             In 2 months Baity, Salvadore Oxford, NP [facility], [facility]

## 2022-08-05 DIAGNOSIS — Z419 Encounter for procedure for purposes other than remedying health state, unspecified: Secondary | ICD-10-CM | POA: Diagnosis not present

## 2022-09-04 DIAGNOSIS — Z419 Encounter for procedure for purposes other than remedying health state, unspecified: Secondary | ICD-10-CM | POA: Diagnosis not present

## 2022-09-05 ENCOUNTER — Ambulatory Visit: Payer: 59 | Admitting: Internal Medicine

## 2022-09-05 NOTE — Progress Notes (Deleted)
Subjective:    Patient ID: Tara Woodward, female    DOB: Mar 19, 1990, 32 y.o.   MRN: [id number]  HPI  Patient presents to clinic today with complaint of swelling in her feet.  She noticed this.  Review of Systems  Past Medical History:  Diagnosis Date   Depression    HTN (hypertension)    Hypothyroidism    Morbid obesity with BMI of 45.0-49.9, adult (HCC)     Current Outpatient Medications  Medication Sig Dispense Refill   atorvastatin (LIPITOR) 10 MG tablet TAKE 1 TABLET BY MOUTH EVERY DAY 90 tablet 1   Drospirenone (SLYND) 4 MG TABS Take 1 tablet by mouth daily. 84 tablet 3   FLUoxetine (PROZAC) 40 MG capsule Take 1 capsule (40 mg total) by mouth daily. 90 capsule 0   levothyroxine (SYNTHROID) 150 MCG tablet TAKE 1 TABLET BY MOUTH EVERY DAY 30 tablet 2   losartan (COZAAR) 50 MG tablet TAKE 1 TABLET BY MOUTH EVERY DAY 90 tablet 1   omeprazole (PRILOSEC) 20 MG capsule Take 1 capsule (20 mg total) by mouth daily. 90 capsule 1   traZODone (DESYREL) 100 MG tablet Take 1 tablet (100 mg total) by mouth at bedtime. 90 tablet 0   Vitamin D, Ergocalciferol, (DRISDOL) 1.25 MG (50000 UNIT) CAPS capsule Take 1 capsule (50,000 Units total) by mouth once a week. For 12 weeks. Then start OTC Vitamin D3 2,000 unit daily. 12 capsule 0   No current facility-administered medications for this visit.    No Known Allergies  Family History  Problem Relation Age of Onset   Drug abuse Mother    Depression Mother    Bipolar disorder Mother    Alcohol abuse Father    Drug abuse Father    Bipolar disorder Father    Healthy Sister    Diabetes Maternal Aunt    Breast cancer Maternal Aunt    Hypertension Maternal Aunt    Diabetes Maternal Grandfather    Lung cancer Maternal Grandfather    Hypertension Maternal Grandmother     Social History   Socioeconomic History   Marital status: Married    Spouse name: Dorene Sorrow   Number of children: 2   Years of education: Not on file   Highest education  level: High school graduate  Occupational History   Not on file  Tobacco Use   Smoking status: Never   Smokeless tobacco: Never  Vaping Use   Vaping Use: Never used  Substance and Sexual Activity   Alcohol use: No   Drug use: No   Sexual activity: Yes    Partners: Male    Birth control/protection: I.U.D.  Other Topics Concern   Not on file  Social History Narrative   Not on file   Social Determinants of Health   Financial Resource Strain: Not on file  Food Insecurity: Not on file  Transportation Needs: Not on file  Physical Activity: Not on file  Stress: Not on file  Social Connections: Not on file  Intimate Partner Violence: Not on file     Constitutional: Denies fever, malaise, fatigue, headache or abrupt weight changes.  HEENT: Denies eye pain, eye redness, ear pain, ringing in the ears, wax buildup, runny nose, nasal congestion, bloody nose, or sore throat. Respiratory: Denies difficulty breathing, shortness of breath, cough or sputum production.   Cardiovascular: Patient reports swelling in feet.  Denies chest pain, chest tightness, palpitations or swelling in the hands.  Gastrointestinal: Denies abdominal pain, bloating, constipation, diarrhea  or blood in the stool.  GU: Denies urgency, frequency, pain with urination, burning sensation, blood in urine, odor or discharge. Musculoskeletal: Denies decrease in range of motion, difficulty with gait, muscle pain or joint pain and swelling.  Skin: Denies redness, rashes, lesions or ulcercations.  Neurological: Patient reports insomnia.  Denies dizziness, difficulty with memory, difficulty with speech or problems with balance and coordination.  Psych: Patient has a history of depression.  Denies anxiety, SI/HI.  No other specific complaints in a complete review of systems (except as listed in HPI above).     Objective:   Physical Exam   There were no vitals taken for this visit. Wt Readings from Last 3 Encounters:   04/11/22 284 lb (128.8 kg)  10/04/21 284 lb (128.8 kg)  09/13/21 290 lb (131.5 kg)    General: Appears their stated age, well developed, well nourished in NAD. Skin: Warm, dry and intact. No rashes, lesions or ulcerations noted. HEENT: Head: normal shape and size; Eyes: sclera white, no icterus, conjunctiva pink, PERRLA and EOMs intact; Ears: Tm's gray and intact, normal light reflex; Nose: mucosa pink and moist, septum midline; Throat/Mouth: Teeth present, mucosa pink and moist, no exudate, lesions or ulcerations noted.  Neck:  Neck supple, trachea midline. No masses, lumps or thyromegaly present.  Cardiovascular: Normal rate and rhythm. S1,S2 noted.  No murmur, rubs or gallops noted. No JVD or BLE edema. No carotid bruits noted. Pulmonary/Chest: Normal effort and positive vesicular breath sounds. No respiratory distress. No wheezes, rales or ronchi noted.  Abdomen: Soft and nontender. Normal bowel sounds. No distention or masses noted. Liver, spleen and kidneys non palpable. Musculoskeletal: Normal range of motion. No signs of joint swelling. No difficulty with gait.  Neurological: Alert and oriented. Cranial nerves II-XII grossly intact. Coordination normal.  Psychiatric: Mood and affect normal. Behavior is normal. Judgment and thought content normal.   BMET    Component Value Date/Time   NA 139 04/11/2022 0825   K 4.1 04/11/2022 0825   CL 103 04/11/2022 0825   CO2 27 04/11/2022 0825   GLUCOSE 89 04/11/2022 0825   BUN 16 04/11/2022 0825   CREATININE 0.58 04/11/2022 0825   CALCIUM 9.3 04/11/2022 0825   GFRNONAA >60 09/13/2021 0848   GFRAA >60 12/09/2019 0551    Lipid Panel     Component Value Date/Time   CHOL 182 04/11/2022 0825   TRIG 196 (H) 04/11/2022 0825   HDL 55 04/11/2022 0825   CHOLHDL 3.3 04/11/2022 0825   LDLCALC 98 04/11/2022 0825    CBC    Component Value Date/Time   WBC 6.2 04/11/2022 0825   RBC 4.37 04/11/2022 0825   HGB 12.6 04/11/2022 0825   HCT 37.4  04/11/2022 0825   PLT 482 (H) 04/11/2022 0825   MCV 85.6 04/11/2022 0825   MCH 28.8 04/11/2022 0825   MCHC 33.7 04/11/2022 0825   RDW 12.7 04/11/2022 0825   LYMPHSABS 2.2 03/05/2020 1542   MONOABS 0.5 03/05/2020 1542   EOSABS 0.0 03/05/2020 1542   BASOSABS 0.0 03/05/2020 1542    Hgb A1C Lab Results  Component Value Date   HGBA1C 5.0 04/11/2022           Assessment & Plan:     RTC in 1 month for your annual exam Nicki Reaper, NP

## 2022-09-06 ENCOUNTER — Ambulatory Visit: Payer: 59 | Admitting: Internal Medicine
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
## 2022-09-20 ENCOUNTER — Encounter: Payer: Self-pay | Admitting: Internal Medicine

## 2022-09-20 ENCOUNTER — Ambulatory Visit (INDEPENDENT_AMBULATORY_CARE_PROVIDER_SITE_OTHER): Payer: 59 | Admitting: Internal Medicine

## 2022-09-20 VITALS — BP 136/92 | HR 80 | Temp 97.3°F | Ht 65.0 in | Wt 286.0 lb

## 2022-09-20 DIAGNOSIS — E782 Mixed hyperlipidemia: Secondary | ICD-10-CM | POA: Diagnosis not present

## 2022-09-20 DIAGNOSIS — Z6841 Body Mass Index (BMI) 40.0 and over, adult: Secondary | ICD-10-CM

## 2022-09-20 DIAGNOSIS — E039 Hypothyroidism, unspecified: Secondary | ICD-10-CM | POA: Diagnosis not present

## 2022-09-20 DIAGNOSIS — Z0001 Encounter for general adult medical examination with abnormal findings: Secondary | ICD-10-CM

## 2022-09-20 DIAGNOSIS — R7309 Other abnormal glucose: Secondary | ICD-10-CM

## 2022-09-20 MED ORDER — LOSARTAN POTASSIUM-HCTZ 100-25 MG PO TABS: 1.0000 | ORAL_TABLET | Freq: Every day | ORAL | 0 refills | Status: DC

## 2022-09-20 NOTE — Patient Instructions (Signed)

## 2022-09-20 NOTE — Assessment & Plan Note (Signed)
Encourage diet and exercise for weight loss 

## 2022-09-20 NOTE — Progress Notes (Signed)
Subjective:    Patient ID: Tara Woodward, female    DOB: 11/05/1990, 32 y.o.   MRN: [id number]  HPI  Patient presents to clinic today for annual exam.  Of note, her BP today is 142/96.  She is taking losartan as prescribed.  She does report increased swelling in her lower extremities.  She denies shortness of breath or chest pain.  Flu: 03/2021 Tetanus: 07/2021 COVID: X 2 Pap smear: 06/2021 Dentist: as needed  Diet: She does eat meat. She consumes fruits and veggies. She does eat fried foods. She drinks mostly water and soda. Exercise: Walking  Review of Systems     Past Medical History:  Diagnosis Date   Depression    HTN (hypertension)    Hypothyroidism    Morbid obesity with BMI of 45.0-49.9, adult (HCC)     Current Outpatient Medications  Medication Sig Dispense Refill   atorvastatin (LIPITOR) 10 MG tablet TAKE 1 TABLET BY MOUTH EVERY DAY 90 tablet 1   Drospirenone (SLYND) 4 MG TABS Take 1 tablet by mouth daily. 84 tablet 3   FLUoxetine (PROZAC) 40 MG capsule Take 1 capsule (40 mg total) by mouth daily. 90 capsule 0   levothyroxine (SYNTHROID) 150 MCG tablet TAKE 1 TABLET BY MOUTH EVERY DAY 30 tablet 2   losartan (COZAAR) 50 MG tablet TAKE 1 TABLET BY MOUTH EVERY DAY 90 tablet 1   omeprazole (PRILOSEC) 20 MG capsule Take 1 capsule (20 mg total) by mouth daily. 90 capsule 1   traZODone (DESYREL) 100 MG tablet Take 1 tablet (100 mg total) by mouth at bedtime. 90 tablet 0   Vitamin D, Ergocalciferol, (DRISDOL) 1.25 MG (50000 UNIT) CAPS capsule Take 1 capsule (50,000 Units total) by mouth once a week. For 12 weeks. Then start OTC Vitamin D3 2,000 unit daily. 12 capsule 0   No current facility-administered medications for this visit.    No Known Allergies  Family History  Problem Relation Age of Onset   Drug abuse Mother    Depression Mother    Bipolar disorder Mother    Alcohol abuse Father    Drug abuse Father    Bipolar disorder Father    Healthy Sister     Diabetes Maternal Aunt    Breast cancer Maternal Aunt    Hypertension Maternal Aunt    Diabetes Maternal Grandfather    Lung cancer Maternal Grandfather    Hypertension Maternal Grandmother     Social History   Socioeconomic History   Marital status: Married    Spouse name: Dorene Sorrow   Number of children: 2   Years of education: Not on file   Highest education level: High school graduate  Occupational History   Not on file  Tobacco Use   Smoking status: Never   Smokeless tobacco: Never  Vaping Use   Vaping status: Never Used  Substance and Sexual Activity   Alcohol use: No   Drug use: No   Sexual activity: Yes    Partners: Male    Birth control/protection: I.U.D.  Other Topics Concern   Not on file  Social History Narrative   Not on file   Social Determinants of Health   Financial Resource Strain: Not on file  Food Insecurity: Not on file  Transportation Needs: Not on file  Physical Activity: Not on file  Stress: Not on file  Social Connections: Not on file  Intimate Partner Violence: Not on file     Constitutional: Denies fever, malaise, fatigue, headache  or abrupt weight changes.  HEENT: Denies eye pain, eye redness, ear pain, ringing in the ears, wax buildup, runny nose, nasal congestion, bloody nose, or sore throat. Respiratory: Denies difficulty breathing, shortness of breath, cough or sputum production.   Cardiovascular: Patient reports swelling in legs.  Denies chest pain, chest tightness, palpitations or swelling in the hands.  Gastrointestinal: Denies abdominal pain, bloating, constipation, diarrhea or blood in the stool.  GU: Denies urgency, frequency, pain with urination, burning sensation, blood in urine, odor or discharge. Musculoskeletal: Denies decrease in range of motion, difficulty with gait, muscle pain or joint pain and swelling.  Skin: Denies redness, rashes, lesions or ulcercations.  Neurological: Patient reports insomnia.  Denies dizziness,  difficulty with memory, difficulty with speech or problems with balance and coordination.  Psych: Patient has a history of depression.  Denies anxiety, SI/HI.  No other specific complaints in a complete review of systems (except as listed in HPI above).  Objective:   Physical Exam  BP (!) 136/92 (BP Location: Left Arm, Patient Position: Sitting, Cuff Size: Large)   Pulse 80   Temp (!) 97.3 F (36.3 C) (Temporal)   Ht 5\' 5"  (1.651 m)   Wt 286 lb (129.7 kg)   SpO2 99%   BMI 47.59 kg/m   Wt Readings from Last 3 Encounters:  04/11/22 284 lb (128.8 kg)  10/04/21 284 lb (128.8 kg)  09/13/21 290 lb (131.5 kg)    General: Appears her stated age, obese in NAD. Skin: Warm, dry and intact.  HEENT: Head: normal shape and size; Eyes: sclera white, no icterus, conjunctiva pink, PERRLA and EOMs intact;  Neck:  Neck supple, trachea midline. No masses, lumps or thyromegaly present.  Cardiovascular: Normal rate and rhythm. S1,S2 noted.  No murmur, rubs or gallops noted. No JVD or BLE edema. No carotid bruits noted. Pulmonary/Chest: Normal effort and positive vesicular breath sounds. No respiratory distress. No wheezes, rales or ronchi noted.  Abdomen: Soft and nontender. Normal bowel sounds.  Musculoskeletal: Strength 5/5 BUE/BLE.  No difficulty with gait.  Neurological: Alert and oriented. Cranial nerves II-XII grossly intact. Coordination normal.  Psychiatric: Mood and affect normal. Behavior is normal. Judgment and thought content normal.    BMET    Component Value Date/Time   NA 139 04/11/2022 0825   K 4.1 04/11/2022 0825   CL 103 04/11/2022 0825   CO2 27 04/11/2022 0825   GLUCOSE 89 04/11/2022 0825   BUN 16 04/11/2022 0825   CREATININE 0.58 04/11/2022 0825   CALCIUM 9.3 04/11/2022 0825   GFRNONAA >60 09/13/2021 0848   GFRAA >60 12/09/2019 0551    Lipid Panel     Component Value Date/Time   CHOL 182 04/11/2022 0825   TRIG 196 (H) 04/11/2022 0825   HDL 55 04/11/2022 0825    CHOLHDL 3.3 04/11/2022 0825   LDLCALC 98 04/11/2022 0825    CBC    Component Value Date/Time   WBC 6.2 04/11/2022 0825   RBC 4.37 04/11/2022 0825   HGB 12.6 04/11/2022 0825   HCT 37.4 04/11/2022 0825   PLT 482 (H) 04/11/2022 0825   MCV 85.6 04/11/2022 0825   MCH 28.8 04/11/2022 0825   MCHC 33.7 04/11/2022 0825   RDW 12.7 04/11/2022 0825   LYMPHSABS 2.2 03/05/2020 1542   MONOABS 0.5 03/05/2020 1542   EOSABS 0.0 03/05/2020 1542   BASOSABS 0.0 03/05/2020 1542    Hgb A1C Lab Results  Component Value Date   HGBA1C 5.0 04/11/2022  Assessment & Plan:   Preventative health maintenance:  Encouraged her to get a flu shot in fall Tetanus UTD Encouraged her to get her COVID booster Pap smear UTD Encouraged her to consume a balanced diet and exercise regimen Advised her to see an eye doctor and dentist annually We will check CBC, c-Met, TSH, free T4 lipid, A1c today  RTC in 2 weeks follow up HTN, 6 months, follow-up chronic conditions Nicki Reaper, NP

## 2022-09-26 ENCOUNTER — Ambulatory Visit: Payer: 59 | Admitting: Psychiatry

## 2022-09-30 ENCOUNTER — Other Ambulatory Visit: Payer: Self-pay | Admitting: Family Medicine

## 2022-09-30 ENCOUNTER — Other Ambulatory Visit: Payer: Self-pay | Admitting: Internal Medicine

## 2022-09-30 ENCOUNTER — Other Ambulatory Visit: Payer: Self-pay | Admitting: Psychiatry

## 2022-09-30 DIAGNOSIS — Z30011 Encounter for initial prescription of contraceptive pills: Secondary | ICD-10-CM

## 2022-10-02 NOTE — Telephone Encounter (Signed)
Requested medication (s) are due for refill today:   Yes for atorvastatin,  No for Synthroid  Requested medication (s) are on the active medication list:   Yes   Future visit scheduled:   Yes   Last ordered: atorvastatin 04/05/2022 #90, 1 refill;    Synthroid discontinued 09/15/2021  Returned because this is a different dose of Synthroid.   Looks like the dose is now 150 mcg    Requested Prescriptions  Pending Prescriptions Disp Refills   atorvastatin (LIPITOR) 10 MG tablet [Pharmacy Med Name: ATORVASTATIN 10 MG TABLET] 30 tablet 5    Sig: TAKE 1 TABLET BY MOUTH EVERY DAY     Cardiovascular:  Antilipid - Statins Failed - 09/30/2022  8:38 PM      Failed - Lipid Panel in normal range within the last 12 months    Cholesterol  Date Value Ref Range Status  04/11/2022 182 <200 mg/dL Final   LDL Cholesterol (Calc)  Date Value Ref Range Status  04/11/2022 98 mg/dL (calc) Final    Comment:    Reference range: <100 . Desirable range <100 mg/dL for primary prevention;   <70 mg/dL for patients with CHD or diabetic patients  with > or = 2 CHD risk factors. Marland Kitchen LDL-C is now calculated using the Martin-Hopkins  calculation, which is a validated novel method providing  better accuracy than the Friedewald equation in the  estimation of LDL-C.  Horald Pollen et al. Lenox Ahr. 9629;528(41): 2061-2068  (http://education.[facility].com/faq/FAQ164)    HDL  Date Value Ref Range Status  04/11/2022 55 > OR = 50 mg/dL Final   Triglycerides  Date Value Ref Range Status  04/11/2022 196 (H) <150 mg/dL Final         Passed - Patient is not pregnant      Passed - Valid encounter within last 12 months    Recent Outpatient Visits           1 week ago Encounter for general adult medical examination with abnormal findings   [place] [facility] [place], [place] W, NP   5 months ago Gastroesophageal reflux disease without esophagitis   [place] [facility] [place],  Salvadore Oxford, NP   12 months ago Mixed hyperlipidemia   [place] [facility] [place], Salvadore Oxford, NP   1 year ago Need for Tdap vaccination   [place] [facility] [place], Salvadore Oxford, NP   1 year ago Primary hypertension   [place] [facility] [place], Salvadore Oxford, NP       Future Appointments             In 1 week [name], Salvadore Oxford, NP [place] [facility]   In 1 week [name], Salvadore Oxford, NP [place] [facility]             levothyroxine (SYNTHROID) 125 MCG tablet [Pharmacy Med Name: LEVOTHYROXINE 125 MCG TABLET] 90 tablet 1    Sig: TAKE 1 TABLET BY MOUTH EVERY DAY     Endocrinology:  Hypothyroid Agents Failed - 09/30/2022  8:38 PM      Failed - TSH in normal range and within 360 days    TSH  Date Value Ref Range Status  04/11/2022 11.35 (H) mIU/L Final    Comment:              Reference Range .           >  or = 20 Years  0.40-4.50 .                Pregnancy Ranges           First trimester    0.26-2.66           Second trimester   0.55-2.73           Third trimester    0.43-2.91          Passed - Valid encounter within last 12 months    Recent Outpatient Visits           1 week ago Encounter for general adult medical examination with abnormal findings   [place] [facility] [place], [place] W, NP   5 months ago Gastroesophageal reflux disease without esophagitis   [place] [facility] [place], Salvadore Oxford, NP   12 months ago Mixed hyperlipidemia   [place] [facility] [place], Salvadore Oxford, NP   1 year ago Need for Tdap vaccination   [place] [facility] [place], Salvadore Oxford, NP   1 year ago Primary hypertension   [place] [facility] [place], Salvadore Oxford, NP       Future Appointments             In 1 week [name], Salvadore Oxford, NP [place] [facility]   In 1 week [place], Salvadore Oxford, NP  [facility], [place]

## 2022-10-05 DIAGNOSIS — Z419 Encounter for procedure for purposes other than remedying health state, unspecified: Secondary | ICD-10-CM | POA: Diagnosis not present

## 2022-10-10 ENCOUNTER — Encounter: Payer: 59 | Admitting: Internal Medicine

## 2022-10-13 ENCOUNTER — Telehealth: Payer: 59 | Admitting: Internal Medicine

## 2022-10-13 ENCOUNTER — Other Ambulatory Visit: Payer: Self-pay | Admitting: Internal Medicine

## 2022-10-13 NOTE — Progress Notes (Deleted)
Virtual Visit via Video Note  I connected with Tara Woodward on 10/13/22 at  3:40 PM EDT by a video enabled telemedicine application and verified that I am speaking with the correct person using two identifiers.  Location: Patient: *** Provider: ***   I discussed the limitations of evaluation and management by telemedicine and the availability of in person appointments. The patient expressed understanding and agreed to proceed.  History of Present Illness:  Patient due for 2-week follow-up of HTN.  At her last visit her losartan was changed to losartan HCT.  She has been taking the medication as prescribed.  Her BP today is.  ECG from 02/2020 reviewed.   Past Medical History:  Diagnosis Date   Depression    HTN (hypertension)    Hypothyroidism    Morbid obesity with BMI of 45.0-49.9, adult (HCC)     Current Outpatient Medications  Medication Sig Dispense Refill   atorvastatin (LIPITOR) 10 MG tablet TAKE 1 TABLET BY MOUTH EVERY DAY 90 tablet 1   FLUoxetine (PROZAC) 40 MG capsule Take 1 capsule (40 mg total) by mouth daily. 90 capsule 0   levothyroxine (SYNTHROID) 150 MCG tablet TAKE 1 TABLET BY MOUTH EVERY DAY 30 tablet 2   losartan-hydrochlorothiazide (HYZAAR) 100-25 MG tablet Take 1 tablet by mouth daily. 90 tablet 0   omeprazole (PRILOSEC) 20 MG capsule Take 1 capsule (20 mg total) by mouth daily. 90 capsule 1   SLYND 4 MG TABS TAKE 1 TABLET BY MOUTH EVERY DAY 84 tablet 3   traZODone (DESYREL) 100 MG tablet Take 1 tablet (100 mg total) by mouth at bedtime as needed for sleep. 30 tablet 1   Vitamin D, Ergocalciferol, (DRISDOL) 1.25 MG (50000 UNIT) CAPS capsule Take 1 capsule (50,000 Units total) by mouth once a week. For 12 weeks. Then start OTC Vitamin D3 2,000 unit daily. 12 capsule 0   No current facility-administered medications for this visit.    No Known Allergies  Family History  Problem Relation Age of Onset   Drug abuse Mother    Depression Mother    Bipolar  disorder Mother    Alcohol abuse Father    Drug abuse Father    Bipolar disorder Father    Healthy Sister    Hypertension Maternal Grandmother    Diabetes Maternal Grandfather    Lung cancer Maternal Grandfather    Diabetes Maternal Aunt    Breast cancer Maternal Aunt    Hypertension Maternal Aunt    Colon cancer Neg Hx     Social History   Socioeconomic History   Marital status: Married    Spouse name: Dorene Sorrow   Number of children: 2   Years of education: Not on file   Highest education level: High school graduate  Occupational History   Not on file  Tobacco Use   Smoking status: Never   Smokeless tobacco: Never  Vaping Use   Vaping status: Never Used  Substance and Sexual Activity   Alcohol use: No   Drug use: No   Sexual activity: Yes    Partners: Male    Birth control/protection: I.U.D.  Other Topics Concern   Not on file  Social History Narrative   Not on file   Social Determinants of Health   Financial Resource Strain: Not on file  Food Insecurity: Not on file  Transportation Needs: Not on file  Physical Activity: Not on file  Stress: Not on file  Social Connections: Not on file  Intimate Partner  Violence: Not on file     Constitutional: Denies fever, malaise, fatigue, headache or abrupt weight changes.  HEENT: Denies eye pain, eye redness, ear pain, ringing in the ears, wax buildup, runny nose, nasal congestion, bloody nose, or sore throat. Respiratory: Denies difficulty breathing, shortness of breath, cough or sputum production.   Cardiovascular: Denies chest pain, chest tightness, palpitations or swelling in the hands or feet.  Gastrointestinal: Denies abdominal pain, bloating, constipation, diarrhea or blood in the stool.  GU: Denies urgency, frequency, pain with urination, burning sensation, blood in urine, odor or discharge. Musculoskeletal: Denies decrease in range of motion, difficulty with gait, muscle pain or joint pain and swelling.  Skin:  Denies redness, rashes, lesions or ulcercations.  Neurological: Patient reports insomnia.  Denies dizziness, difficulty with memory, difficulty with speech or problems with balance and coordination.  Psych: Patient has a history of depression.  Denies anxiety, SI/HI.  No other specific complaints in a complete review of systems (except as listed in HPI above).    Observations/Objective:  There were no vitals taken for this visit. Wt Readings from Last 3 Encounters:  09/20/22 286 lb (129.7 kg)  04/11/22 284 lb (128.8 kg)  10/04/21 284 lb (128.8 kg)    General: Appears her stated age, obese, in NAD. Pulmonary/Chest: Normal effort . No respiratory distress.   Neurological: Alert and oriented. Coordination normal.    BMET    Component Value Date/Time   NA 139 04/11/2022 0825   K 4.1 04/11/2022 0825   CL 103 04/11/2022 0825   CO2 27 04/11/2022 0825   GLUCOSE 89 04/11/2022 0825   BUN 16 04/11/2022 0825   CREATININE 0.58 04/11/2022 0825   CALCIUM 9.3 04/11/2022 0825   GFRNONAA >60 09/13/2021 0848   GFRAA >60 12/09/2019 0551    Lipid Panel     Component Value Date/Time   CHOL 182 04/11/2022 0825   TRIG 196 (H) 04/11/2022 0825   HDL 55 04/11/2022 0825   CHOLHDL 3.3 04/11/2022 0825   LDLCALC 98 04/11/2022 0825    CBC    Component Value Date/Time   WBC 6.2 04/11/2022 0825   RBC 4.37 04/11/2022 0825   HGB 12.6 04/11/2022 0825   HCT 37.4 04/11/2022 0825   PLT 482 (H) 04/11/2022 0825   MCV 85.6 04/11/2022 0825   MCH 28.8 04/11/2022 0825   MCHC 33.7 04/11/2022 0825   RDW 12.7 04/11/2022 0825   LYMPHSABS 2.2 03/05/2020 1542   MONOABS 0.5 03/05/2020 1542   EOSABS 0.0 03/05/2020 1542   BASOSABS 0.0 03/05/2020 1542    Hgb A1C Lab Results  Component Value Date   HGBA1C 5.0 04/11/2022        Assessment and Plan:  RTC in 5 months for follow-up of chronic conditions  Follow Up Instructions:    I discussed the assessment and treatment plan with the patient.  The patient was provided an opportunity to ask questions and all were answered. The patient agreed with the plan and demonstrated an understanding of the instructions.   The patient was advised to call back or seek an in-person evaluation if the symptoms worsen or if the condition fails to improve as anticipated.    Nicki Reaper, NP

## 2022-10-16 NOTE — Telephone Encounter (Signed)
Requested Prescriptions  Pending Prescriptions Disp Refills   FLUoxetine (PROZAC) 40 MG capsule [Pharmacy Med Name: FLUOXETINE HCL 40 MG CAPSULE] 30 capsule 2    Sig: TAKE 1 CAPSULE (40 MG TOTAL) BY MOUTH DAILY.     Psychiatry:  Antidepressants - SSRI Passed - 10/13/2022  7:56 PM      Passed - Completed PHQ-2 or PHQ-9 in the last 360 days      Passed - Valid encounter within last 6 months    Recent Outpatient Visits           3 weeks ago Encounter for general adult medical examination with abnormal findings   [place] [facility] [place], [place] W, NP   6 months ago Gastroesophageal reflux disease without esophagitis   [place] [facility] [place], Salvadore Oxford, NP   1 year ago Mixed hyperlipidemia   [place] [facility] [place], Salvadore Oxford, NP   1 year ago Need for Tdap vaccination   [place] [facility] [place], Salvadore Oxford, NP   1 year ago Primary hypertension   [facility] [place], Salvadore [place], [place]

## 2022-11-05 DIAGNOSIS — Z419 Encounter for procedure for purposes other than remedying health state, unspecified: Secondary | ICD-10-CM | POA: Diagnosis not present

## 2022-11-05 NOTE — Progress Notes (Unsigned)
No show

## 2022-11-12 ENCOUNTER — Other Ambulatory Visit: Payer: Self-pay | Admitting: Psychiatry

## 2022-11-13 ENCOUNTER — Ambulatory Visit (INDEPENDENT_AMBULATORY_CARE_PROVIDER_SITE_OTHER): Payer: Self-pay | Admitting: Psychiatry

## 2022-11-13 DIAGNOSIS — Z91199 Patient's noncompliance with other medical treatment and regimen due to unspecified reason: Secondary | ICD-10-CM

## 2022-11-23 ENCOUNTER — Other Ambulatory Visit: Payer: Self-pay | Admitting: Internal Medicine

## 2022-11-27 NOTE — Telephone Encounter (Signed)
Requested medication (s) are due for refill today: yes  Requested medication (s) are on the active medication list: yes  Last refill:  08/01/22 #30 2 RF  Future visit scheduled: yes  Notes to clinic:  abnormal lab work// has appt in 2 days   Requested Prescriptions  Pending Prescriptions Disp Refills   levothyroxine (SYNTHROID) 150 MCG tablet [Pharmacy Med Name: LEVOTHYROXINE 150 MCG TABLET] 30 tablet     Sig: TAKE 1 TABLET BY MOUTH EVERY DAY     Endocrinology:  Hypothyroid Agents Failed - 11/23/2022  7:14 PM      Failed - TSH in normal range and within 360 days    TSH  Date Value Ref Range Status  04/11/2022 11.35 (H) mIU/L Final    Comment:              Reference Range .           > or = 20 Years  0.40-4.50 .                Pregnancy Ranges           First trimester    0.26-2.66           Second trimester   0.55-2.73           Third trimester    0.43-2.91          Passed - Valid encounter within last 12 months    Recent Outpatient Visits           2 months ago Encounter for general adult medical examination with abnormal findings   [place] [facility] [place], [place] W, NP   7 months ago Gastroesophageal reflux disease without esophagitis   [place] [facility] [place], Salvadore Oxford, NP   1 year ago Mixed hyperlipidemia   [place] [facility] [place], Salvadore Oxford, NP   1 year ago Need for Tdap vaccination   [place] [facility] [place], Salvadore Oxford, NP   1 year ago Primary hypertension   [place] [facility] [place], Salvadore Oxford, NP       Future Appointments             In 2 days [name], Salvadore Oxford, NP [place] [facility]            Refused Prescriptions Disp Refills   levothyroxine (SYNTHROID) 125 MCG tablet [Pharmacy Med Name: LEVOTHYROXINE 125 MCG TABLET] 90 tablet     Sig: TAKE 1 TABLET BY MOUTH EVERY DAY     Endocrinology:  Hypothyroid Agents Failed -  11/23/2022  7:14 PM      Failed - TSH in normal range and within 360 days    TSH  Date Value Ref Range Status  04/11/2022 11.35 (H) mIU/L Final    Comment:              Reference Range .           > or = 20 Years  0.40-4.50 .                Pregnancy Ranges           First trimester    0.26-2.66           Second trimester   0.55-2.73           Third trimester    0.43-2.91  Passed - Valid encounter within last 12 months    Recent Outpatient Visits           2 months ago Encounter for general adult medical examination with abnormal findings   [place] [facility] [place], [place] W, NP   7 months ago Gastroesophageal reflux disease without esophagitis   [place] [facility] [place], Salvadore Oxford, NP   1 year ago Mixed hyperlipidemia   [place] [facility] [place], Salvadore Oxford, NP   1 year ago Need for Tdap vaccination   [place] [facility] [place], Salvadore Oxford, NP   1 year ago Primary hypertension   [place] [facility] [place], Salvadore Oxford, NP       Future Appointments             In 2 days [name], Salvadore Oxford, NP [place] [facility], [facility]

## 2022-11-27 NOTE — Telephone Encounter (Signed)
Requested Prescriptions  Pending Prescriptions Disp Refills   levothyroxine (SYNTHROID) 150 MCG tablet [Pharmacy Med Name: LEVOTHYROXINE 150 MCG TABLET] 30 tablet     Sig: TAKE 1 TABLET BY MOUTH EVERY DAY     Endocrinology:  Hypothyroid Agents Failed - 11/23/2022  7:14 PM      Failed - TSH in normal range and within 360 days    TSH  Date Value Ref Range Status  04/11/2022 11.35 (H) mIU/L Final    Comment:              Reference Range .           > or = 20 Years  0.40-4.50 .                Pregnancy Ranges           First trimester    0.26-2.66           Second trimester   0.55-2.73           Third trimester    0.43-2.91          Passed - Valid encounter within last 12 months    Recent Outpatient Visits           2 months ago Encounter for general adult medical examination with abnormal findings   [place] [facility] [place], [place] W, NP   7 months ago Gastroesophageal reflux disease without esophagitis   [place] [facility] [place], Salvadore Oxford, NP   1 year ago Mixed hyperlipidemia   [place] [facility] [place], Salvadore Oxford, NP   1 year ago Need for Tdap vaccination   [place] [facility] [place], Salvadore Oxford, NP   1 year ago Primary hypertension   [place] [facility] [place], Salvadore Oxford, NP       Future Appointments             In 2 days [name], Salvadore Oxford, NP [place] [facility]             levothyroxine (SYNTHROID) 125 MCG tablet [Pharmacy Med Name: LEVOTHYROXINE 125 MCG TABLET] 90 tablet     Sig: TAKE 1 TABLET BY MOUTH EVERY DAY     Endocrinology:  Hypothyroid Agents Failed - 11/23/2022  7:14 PM      Failed - TSH in normal range and within 360 days    TSH  Date Value Ref Range Status  04/11/2022 11.35 (H) mIU/L Final    Comment:              Reference Range .           > or = 20 Years  0.40-4.50 .                Pregnancy Ranges           First  trimester    0.26-2.66           Second trimester   0.55-2.73           Third trimester    0.43-2.91          Passed - Valid encounter within last 12 months    Recent Outpatient Visits           2 months ago Encounter for general adult medical examination with abnormal findings   [place] [facility] [place], Salvadore Oxford, NP   7  months ago Gastroesophageal reflux disease without esophagitis   [place] [facility] [place], Salvadore Oxford, NP   1 year ago Mixed hyperlipidemia   [place] [facility] [place], Salvadore Oxford, NP   1 year ago Need for Tdap vaccination   [place] [facility] [place], Salvadore Oxford, NP   1 year ago Primary hypertension   [place] [facility] [place], Salvadore Oxford, NP       Future Appointments             In 2 days [name], Salvadore Oxford, NP [facility]

## 2022-11-29 ENCOUNTER — Ambulatory Visit: Payer: 59 | Admitting: Internal Medicine

## 2022-11-29 NOTE — Progress Notes (Deleted)
Subjective:    Patient ID: Tara Woodward, female    DOB: 1990-06-21, 32 y.o.   MRN: [id number]  HPI    Review of Systems     Past Medical History:  Diagnosis Date   Depression    HTN (hypertension)    Hypothyroidism    Morbid obesity with BMI of 45.0-49.9, adult (HCC)     Current Outpatient Medications  Medication Sig Dispense Refill   atorvastatin (LIPITOR) 10 MG tablet TAKE 1 TABLET BY MOUTH EVERY DAY 90 tablet 1   FLUoxetine (PROZAC) 40 MG capsule TAKE 1 CAPSULE (40 MG TOTAL) BY MOUTH DAILY. 30 capsule 2   levothyroxine (SYNTHROID) 150 MCG tablet TAKE 1 TABLET BY MOUTH EVERY DAY 90 tablet 0   losartan-hydrochlorothiazide (HYZAAR) 100-25 MG tablet Take 1 tablet by mouth daily. 90 tablet 0   omeprazole (PRILOSEC) 20 MG capsule Take 1 capsule (20 mg total) by mouth daily. 90 capsule 1   SLYND 4 MG TABS TAKE 1 TABLET BY MOUTH EVERY DAY 84 tablet 3   traZODone (DESYREL) 100 MG tablet Take 1 tablet (100 mg total) by mouth at bedtime as needed. for sleep 90 tablet 0   Vitamin D, Ergocalciferol, (DRISDOL) 1.25 MG (50000 UNIT) CAPS capsule Take 1 capsule (50,000 Units total) by mouth once a week. For 12 weeks. Then start OTC Vitamin D3 2,000 unit daily. 12 capsule 0   No current facility-administered medications for this visit.    No Known Allergies  Family History  Problem Relation Age of Onset   Drug abuse Mother    Depression Mother    Bipolar disorder Mother    Alcohol abuse Father    Drug abuse Father    Bipolar disorder Father    Healthy Sister    Hypertension Maternal Grandmother    Diabetes Maternal Grandfather    Lung cancer Maternal Grandfather    Diabetes Maternal Aunt    Breast cancer Maternal Aunt    Hypertension Maternal Aunt    Colon cancer Neg Hx     Social History   Socioeconomic History   Marital status: Married    Spouse name: Dorene Sorrow   Number of children: 2   Years of education: Not on file   Highest education level: High school graduate   Occupational History   Not on file  Tobacco Use   Smoking status: Never   Smokeless tobacco: Never  Vaping Use   Vaping status: Never Used  Substance and Sexual Activity   Alcohol use: No   Drug use: No   Sexual activity: Yes    Partners: Male    Birth control/protection: I.U.D.  Other Topics Concern   Not on file  Social History Narrative   Not on file   Social Determinants of Health   Financial Resource Strain: Not on file  Food Insecurity: Not on file  Transportation Needs: Not on file  Physical Activity: Not on file  Stress: Not on file  Social Connections: Not on file  Intimate Partner Violence: Not on file     Constitutional: Denies fever, malaise, fatigue, headache or abrupt weight changes.  HEENT: Denies eye pain, eye redness, ear pain, ringing in the ears, wax buildup, runny nose, nasal congestion, bloody nose, or sore throat. Respiratory: Denies difficulty breathing, shortness of breath, cough or sputum production.   Cardiovascular: Patient reports swelling in legs.  Denies chest pain, chest tightness, palpitations or swelling in the hands.  Gastrointestinal: Denies abdominal pain, bloating, constipation, diarrhea or blood  in the stool.  GU: Denies urgency, frequency, pain with urination, burning sensation, blood in urine, odor or discharge. Musculoskeletal: Denies decrease in range of motion, difficulty with gait, muscle pain or joint pain and swelling.  Skin: Denies redness, rashes, lesions or ulcercations.  Neurological: Patient reports insomnia.  Denies dizziness, difficulty with memory, difficulty with speech or problems with balance and coordination.  Psych: Patient has a history of depression.  Denies anxiety, SI/HI.  No other specific complaints in a complete review of systems (except as listed in HPI above).  Objective:   Physical Exam   There were no vitals taken for this visit. Wt Readings from Last 3 Encounters:  09/20/22 286 lb (129.7 kg)   04/11/22 284 lb (128.8 kg)  10/04/21 284 lb (128.8 kg)    General: Appears their stated age, well developed, well nourished in NAD. Skin: Warm, dry and intact. No rashes, lesions or ulcerations noted. HEENT: Head: normal shape and size; Eyes: sclera white, no icterus, conjunctiva pink, PERRLA and EOMs intact; Ears: Tm's gray and intact, normal light reflex; Nose: mucosa pink and moist, septum midline; Throat/Mouth: Teeth present, mucosa pink and moist, no exudate, lesions or ulcerations noted.  Neck:  Neck supple, trachea midline. No masses, lumps or thyromegaly present.  Cardiovascular: Normal rate and rhythm. S1,S2 noted.  No murmur, rubs or gallops noted. No JVD or BLE edema. No carotid bruits noted. Pulmonary/Chest: Normal effort and positive vesicular breath sounds. No respiratory distress. No wheezes, rales or ronchi noted.  Abdomen: Soft and nontender. Normal bowel sounds. No distention or masses noted. Liver, spleen and kidneys non palpable. Musculoskeletal: Normal range of motion. No signs of joint swelling. No difficulty with gait.  Neurological: Alert and oriented. Cranial nerves II-XII grossly intact. Coordination normal.  Psychiatric: Mood and affect normal. Behavior is normal. Judgment and thought content normal.   EKG:  BMET    Component Value Date/Time   NA 139 04/11/2022 0825   K 4.1 04/11/2022 0825   CL 103 04/11/2022 0825   CO2 27 04/11/2022 0825   GLUCOSE 89 04/11/2022 0825   BUN 16 04/11/2022 0825   CREATININE 0.58 04/11/2022 0825   CALCIUM 9.3 04/11/2022 0825   GFRNONAA >60 09/13/2021 0848   GFRAA >60 12/09/2019 0551    Lipid Panel     Component Value Date/Time   CHOL 182 04/11/2022 0825   TRIG 196 (H) 04/11/2022 0825   HDL 55 04/11/2022 0825   CHOLHDL 3.3 04/11/2022 0825   LDLCALC 98 04/11/2022 0825    CBC    Component Value Date/Time   WBC 6.2 04/11/2022 0825   RBC 4.37 04/11/2022 0825   HGB 12.6 04/11/2022 0825   HCT 37.4 04/11/2022 0825    PLT 482 (H) 04/11/2022 0825   MCV 85.6 04/11/2022 0825   MCH 28.8 04/11/2022 0825   MCHC 33.7 04/11/2022 0825   RDW 12.7 04/11/2022 0825   LYMPHSABS 2.2 03/05/2020 1542   MONOABS 0.5 03/05/2020 1542   EOSABS 0.0 03/05/2020 1542   BASOSABS 0.0 03/05/2020 1542    Hgb A1C Lab Results  Component Value Date   HGBA1C 5.0 04/11/2022           Assessment & Plan:     RTC in 4 months, follow-up chronic conditions Nicki Reaper, NP

## 2022-12-05 DIAGNOSIS — Z419 Encounter for procedure for purposes other than remedying health state, unspecified: Secondary | ICD-10-CM | POA: Diagnosis not present

## 2022-12-20 ENCOUNTER — Other Ambulatory Visit: Payer: Self-pay | Admitting: Internal Medicine

## 2022-12-20 NOTE — Telephone Encounter (Signed)
Requested medication (s) are due for refill today: no  Requested medication (s) are on the active medication list:yes  Last refill:  10/16/22 #30 2 RF  Future visit scheduled: no  Notes to clinic:  pharmacy requesting 90 day refills   Requested Prescriptions  Pending Prescriptions Disp Refills   FLUoxetine (PROZAC) 40 MG capsule [Pharmacy Med Name: FLUOXETINE HCL 40 MG CAPSULE] 90 capsule 1    Sig: TAKE 1 CAPSULE (40 MG TOTAL) BY MOUTH DAILY.     Psychiatry:  Antidepressants - SSRI Passed - 12/20/2022  2:31 PM      Passed - Completed PHQ-2 or PHQ-9 in the last 360 days      Passed - Valid encounter within last 6 months    Recent Outpatient Visits           3 months ago Encounter for general adult medical examination with abnormal findings   [place] [facility] [place], [place], NP   8 months ago Gastroesophageal reflux disease without esophagitis   [facility] [place], Salvadore [place], NP   1 year ago Mixed hyperlipidemia   [place] [facility] [place], Salvadore [place], NP   1 year ago Need for Tdap vaccination   [facility] [place], Salvadore [place], NP   1 year ago Primary hypertension   [place] [facility] [place], Salvadore [place], [place]

## 2023-01-05 DIAGNOSIS — Z419 Encounter for procedure for purposes other than remedying health state, unspecified: Secondary | ICD-10-CM | POA: Diagnosis not present
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
## 2023-01-16 ENCOUNTER — Ambulatory Visit (INDEPENDENT_AMBULATORY_CARE_PROVIDER_SITE_OTHER): Payer: Self-pay | Admitting: Psychiatry

## 2023-01-16 DIAGNOSIS — Z91199 Patient's noncompliance with other medical treatment and regimen due to unspecified reason: Secondary | ICD-10-CM

## 2023-02-04 DIAGNOSIS — Z419 Encounter for procedure for purposes other than remedying health state, unspecified: Secondary | ICD-10-CM | POA: Diagnosis not present

## 2023-03-03 ENCOUNTER — Other Ambulatory Visit: Payer: Self-pay | Admitting: Internal Medicine

## 2023-03-07 DIAGNOSIS — Z419 Encounter for procedure for purposes other than remedying health state, unspecified: Secondary | ICD-10-CM | POA: Diagnosis not present

## 2023-03-08 NOTE — Telephone Encounter (Signed)
 Requested Prescriptions  Pending Prescriptions Disp Refills   levothyroxine  (SYNTHROID ) 150 MCG tablet [Pharmacy Med Name: LEVOTHYROXINE  150 MCG TABLET] 90 tablet 1    Sig: TAKE 1 TABLET BY MOUTH EVERY DAY     Endocrinology:  Hypothyroid Agents Failed - 03/08/2023 12:26 PM      Failed - TSH in normal range and within 360 days    TSH  Date Value Ref Range Status  04/11/2022 11.35 (H) mIU/L Final    Comment:              Reference Range .           > or = 20 Years  0.40-4.50 .                Pregnancy Ranges           First trimester    0.26-2.66           Second trimester   0.55-2.73           Third trimester    0.43-2.91          Passed - Valid encounter within last 12 months    Recent Outpatient Visits           5 months ago Encounter for general adult medical examination with abnormal findings   [place] [facility] [place], [place], NP   11 months ago Gastroesophageal reflux disease without esophagitis   [place] [facility] [place], Angeline ORN, NP   1 year ago Mixed hyperlipidemia   [facility] [place], Angeline ORN, NP   1 year ago Need for Tdap vaccination   [place] [facility] [place], Angeline ORN, NP   1 year ago Primary hypertension   [facility] [place], Angeline ORN, [place]

## 2023-04-07 DIAGNOSIS — Z419 Encounter for procedure for purposes other than remedying health state, unspecified: Secondary | ICD-10-CM | POA: Diagnosis not present

## 2023-04-25 ENCOUNTER — Ambulatory Visit (INDEPENDENT_AMBULATORY_CARE_PROVIDER_SITE_OTHER): Payer: 59 | Admitting: Cardiology

## 2023-04-25 ENCOUNTER — Encounter: Payer: Self-pay | Admitting: Cardiology

## 2023-04-25 VITALS — BP 148/90 | HR 87 | Ht 65.0 in | Wt 310.0 lb

## 2023-04-25 DIAGNOSIS — E039 Hypothyroidism, unspecified: Secondary | ICD-10-CM | POA: Diagnosis not present

## 2023-04-25 DIAGNOSIS — F332 Major depressive disorder, recurrent severe without psychotic features: Secondary | ICD-10-CM

## 2023-04-25 DIAGNOSIS — I10 Essential (primary) hypertension: Secondary | ICD-10-CM | POA: Diagnosis not present

## 2023-04-25 DIAGNOSIS — Z6841 Body Mass Index (BMI) 40.0 and over, adult: Secondary | ICD-10-CM

## 2023-04-25 DIAGNOSIS — E782 Mixed hyperlipidemia: Secondary | ICD-10-CM | POA: Diagnosis not present

## 2023-04-25 DIAGNOSIS — E66813 Obesity, class 3: Secondary | ICD-10-CM | POA: Diagnosis not present

## 2023-04-25 MED ORDER — LISINOPRIL 20 MG PO TABS: 20.0000 mg | ORAL_TABLET | Freq: Every day | ORAL | 3 refills | Status: DC

## 2023-04-25 MED ORDER — LEVOTHYROXINE SODIUM 150 MCG PO TABS: 150.0000 ug | ORAL_TABLET | Freq: Every day | ORAL | 1 refills | Status: DC

## 2023-04-25 MED ORDER — HYDROCHLOROTHIAZIDE 25 MG PO TABS: 25.0000 mg | ORAL_TABLET | Freq: Every day | ORAL | 1 refills | Status: DC

## 2023-04-25 MED ORDER — FLUOXETINE HCL 10 MG PO CAPS
10.0000 mg | ORAL_CAPSULE | Freq: Every day | ORAL | 2 refills | Status: DC
Start: 2023-04-25 — End: 2023-07-10

## 2023-04-25 MED ORDER — PRAVASTATIN SODIUM 40 MG PO TABS: 40.0000 mg | ORAL_TABLET | Freq: Every evening | ORAL | 1 refills | Status: DC

## 2023-04-25 NOTE — Progress Notes (Addendum)
 New Patient Office Visit  Subjective   Patient ID: Tara Woodward, female    DOB: 12/03/1990  Age: 33 y.o. MRN: [id number]  CC:  Chief Complaint  Patient presents with   Establish Care    Est Care  Needs medications     HPI Tara Woodward presents to establish care Previous Primary Care provider/office:   she does have additional concerns to discuss today.   Patient in office to establish care. Patient has not been taking her medications due to not having insurance. Will refill medications, change medications to get them off the $4 list at [facility]. Change losartan to lisinopril. Restart hydrochlorothiazide, levothyroxine. Change atorvastatin to pravastatin. Patient was on fluoxetine 40 mg, will start with 10 mg.  Patient has a history of vitamin D deficiency, was on weekly supplement, recommend daily supplement due to not having insurance.  Patient complains of finger numbness, recommend starting a multivitamin for possible vitamin B deficiency. Unable to do blood work due to not having insurance. Patient states she will have insurance in October.  Patient states she sees GYN for her pap smears and birth control.     Outpatient Encounter Medications as of 04/25/2023  Medication Sig   cholecalciferol (VITAMIN D3) 25 MCG (1000 UNIT) tablet Take 1,000 Units by mouth daily.   FLUoxetine (PROZAC) 10 MG capsule Take 1 capsule (10 mg total) by mouth daily.   hydrochlorothiazide (HYDRODIURIL) 25 MG tablet Take 1 tablet (25 mg total) by mouth daily.   levothyroxine (SYNTHROID) 150 MCG tablet Take 1 tablet (150 mcg total) by mouth daily.   lisinopril (ZESTRIL) 20 MG tablet Take 1 tablet (20 mg total) by mouth daily.   pravastatin (PRAVACHOL) 40 MG tablet Take 1 tablet (40 mg total) by mouth every evening.   omeprazole (PRILOSEC) 20 MG capsule Take 1 capsule (20 mg total) by mouth daily. (Patient not taking: Reported on 04/25/2023)   SLYND 4 MG TABS TAKE 1 TABLET BY MOUTH EVERY DAY (Patient  not taking: Reported on 04/25/2023)   [DISCONTINUED] atorvastatin (LIPITOR) 10 MG tablet TAKE 1 TABLET BY MOUTH EVERY DAY (Patient not taking: Reported on 04/25/2023)   [DISCONTINUED] FLUoxetine (PROZAC) 40 MG capsule TAKE 1 CAPSULE (40 MG TOTAL) BY MOUTH DAILY.   [DISCONTINUED] levothyroxine (SYNTHROID) 150 MCG tablet TAKE 1 TABLET BY MOUTH EVERY DAY (Patient not taking: Reported on 04/25/2023)   [DISCONTINUED] losartan-hydrochlorothiazide (HYZAAR) 100-25 MG tablet Take 1 tablet by mouth daily. (Patient not taking: Reported on 04/25/2023)   [DISCONTINUED] traZODone (DESYREL) 100 MG tablet Take 1 tablet (100 mg total) by mouth at bedtime as needed. for sleep   [DISCONTINUED] Vitamin D, Ergocalciferol, (DRISDOL) 1.25 MG (50000 UNIT) CAPS capsule Take 1 capsule (50,000 Units total) by mouth once a week. For 12 weeks. Then start OTC Vitamin D3 2,000 unit daily. (Patient not taking: Reported on 04/25/2023)   No facility-administered encounter medications on file as of 04/25/2023.    Past Medical History:  Diagnosis Date   Depression    HTN (hypertension)    Hypothyroidism    Morbid obesity with BMI of 45.0-49.9, adult (HCC)     History reviewed. No pertinent surgical history.  Family History  Problem Relation Age of Onset   Drug abuse Mother    Depression Mother    Bipolar disorder Mother    Alcohol abuse Father    Drug abuse Father    Bipolar disorder Father    Healthy Sister    Hypertension Maternal Grandmother  Diabetes Maternal Grandfather    Lung cancer Maternal Grandfather    Diabetes Maternal Aunt    Breast cancer Maternal Aunt    Hypertension Maternal Aunt    Colon cancer Neg Hx     Social History   Socioeconomic History   Marital status: Married    Spouse name: Dorene Sorrow   Number of children: 2   Years of education: Not on file   Highest education level: High school graduate  Occupational History   Not on file  Tobacco Use   Smoking status: Never   Smokeless tobacco:  Never  Vaping Use   Vaping status: Never Used  Substance and Sexual Activity   Alcohol use: No   Drug use: No   Sexual activity: Yes    Partners: Male    Birth control/protection: I.U.D.  Other Topics Concern   Not on file  Social History Narrative   Not on file   Social Drivers of Health   Financial Resource Strain: Not on file  Food Insecurity: Not on file  Transportation Needs: Not on file  Physical Activity: Not on file  Stress: Not on file  Social Connections: Not on file  Intimate Partner Violence: Not on file    Review of Systems  Constitutional: Negative.   HENT: Negative.    Eyes: Negative.   Respiratory: Negative.  Negative for shortness of breath.   Cardiovascular: Negative.  Negative for chest pain.  Gastrointestinal: Negative.  Negative for abdominal pain, constipation and diarrhea.  Genitourinary: Negative.   Musculoskeletal:  Negative for joint pain and myalgias.  Skin: Negative.   Neurological: Negative.  Negative for dizziness and headaches.  Endo/Heme/Allergies: Negative.   All other systems reviewed and are negative.       Objective   BP (!) 148/90 (BP Location: Left Arm, Patient Position: Sitting, Cuff Size: Large)   Pulse 87   Ht 5\' 5"  (1.651 m)   Wt (!) 310 lb (140.6 kg)   SpO2 98%   BMI 51.59 kg/m   Physical Exam Vitals and nursing note reviewed.  Constitutional:      Appearance: Normal appearance. She is normal weight.  HENT:     Head: Normocephalic and atraumatic.     Nose: Nose normal.     Mouth/Throat:     Mouth: Mucous membranes are moist.  Eyes:     Extraocular Movements: Extraocular movements intact.     Conjunctiva/sclera: Conjunctivae normal.     Pupils: Pupils are equal, round, and reactive to light.  Cardiovascular:     Rate and Rhythm: Normal rate and regular rhythm.     Pulses: Normal pulses.     Heart sounds: Normal heart sounds.  Pulmonary:     Effort: Pulmonary effort is normal.     Breath sounds: Normal  breath sounds.  Abdominal:     General: Abdomen is flat. Bowel sounds are normal.     Palpations: Abdomen is soft.  Musculoskeletal:        General: Normal range of motion.     Cervical back: Normal range of motion.  Skin:    General: Skin is warm and dry.  Neurological:     General: No focal deficit present.     Mental Status: She is alert and oriented to person, place, and time.  Psychiatric:        Mood and Affect: Mood normal.        Behavior: Behavior normal.        Thought Content: Thought content  normal.        Judgment: Judgment normal.       04/25/2023   11:34 AM 09/20/2022    2:03 PM 04/11/2022    8:14 AM 03/20/2022   11:38 AM  GAD 7 : Generalized Anxiety Score  Nervous, Anxious, on Edge 1 1 1    Control/stop worrying 2 1 0   Worry too much - different things 3 1 0   Trouble relaxing 1 3 0   Restless 0 0 0   Easily annoyed or irritable 1  0   Afraid - awful might happen 0 0 0   Total GAD 7 Score 8  1   Anxiety Difficulty  Not difficult at all Not difficult at all      Information is confidential and restricted. Go to Review Flowsheets to unlock data.    Flowsheet Row Office Visit from 04[facility]25/2023 in [facility] Office Visit from [facility] Office Visit from [facility]04/[facility]  Thoughts that you would be better off dead, or of hurting yourself in some way Not at all Not at all Not at all  PHQ-9 Total Score 4 0 5           Assessment & Plan:  Change losartan to lisinopril Restart hydrochlorothiazide.  Change atorvastatin to pravastatin.  Fluoxetine 10 mg Levothyroxine 150 mcg.  Multivitamin for vitamin B OTC vitamin D  Problem List Items Addressed This Visit       Cardiovascular and Mediastinum   HTN (hypertension) - Primary   Relevant Medications   lisinopril (ZESTRIL) 20 MG tablet   hydrochlorothiazide (HYDRODIURIL) 25 MG tablet   pravastatin (PRAVACHOL) 40 MG  tablet     Endocrine   Acquired hypothyroidism   Relevant Medications   levothyroxine (SYNTHROID) 150 MCG tablet     Other   Major depression, recurrent (HCC)   Relevant Medications   FLUoxetine (PROZAC) 10 MG capsule   Class 3 severe obesity due to excess calories with body mass index (BMI) of 45.0 to 49.9 in adult (HCC)   HLD (hyperlipidemia)   Relevant Medications   lisinopril (ZESTRIL) 20 MG tablet   hydrochlorothiazide (HYDRODIURIL) 25 MG tablet   pravastatin (PRAVACHOL) 40 MG tablet    Return in about 4 weeks (around 05/23/2023).   Total time spent: 25 minutes  [employer], NP  04/25/2023   This document may have been prepared by Dragon Voice Recognition software and as such may include unintentional dictation errors.

## 2023-04-30 ENCOUNTER — Other Ambulatory Visit: Payer: Self-pay

## 2023-04-30 DIAGNOSIS — Z5321 Procedure and treatment not carried out due to patient leaving prior to being seen by health care provider: Secondary | ICD-10-CM | POA: Diagnosis not present

## 2023-04-30 DIAGNOSIS — M79641 Pain in right hand: Secondary | ICD-10-CM | POA: Insufficient documentation

## 2023-04-30 DIAGNOSIS — M79642 Pain in left hand: Secondary | ICD-10-CM | POA: Diagnosis not present

## 2023-04-30 DIAGNOSIS — R200 Anesthesia of skin: Secondary | ICD-10-CM | POA: Diagnosis not present

## 2023-04-30 NOTE — ED Triage Notes (Signed)
 Pt reports bilateral hand pain/numbness that has been going on for aprox 1 month at night time. Pt denies headaches extremity weakness vision changes or slurred speech.

## 2023-04-30 NOTE — ED Notes (Signed)
 No answer when called several times from lobby

## 2023-05-01 ENCOUNTER — Emergency Department
Admission: EM | Admit: 2023-05-01 | Discharge: 2023-05-01 | Discharge status: Against Medical Advice | Payer: Medicaid Other | Attending: Emergency Medicine | Admitting: Emergency Medicine

## 2023-05-01 NOTE — ED Notes (Signed)
 No answer when called several times from lobby

## 2023-05-05 DIAGNOSIS — Z419 Encounter for procedure for purposes other than remedying health state, unspecified: Secondary | ICD-10-CM | POA: Diagnosis not present

## 2023-05-08 ENCOUNTER — Other Ambulatory Visit: Payer: Self-pay | Admitting: Cardiology

## 2023-05-08 ENCOUNTER — Telehealth: Payer: Self-pay

## 2023-05-08 MED ORDER — ONDANSETRON 4 MG PO TBDP: 4.0000 mg | ORAL_TABLET | Freq: Three times a day (TID) | ORAL | 0 refills | Status: DC | PRN

## 2023-05-08 NOTE — Telephone Encounter (Signed)
 Patient called stating that for the last two weeks she's been throwing up, she says she's week and her stomach feels empty and then she will eat but then throw it all back up. She said she is not taking her TSH meds by itself waiting 30 min  then taking the rest of her meds she's taking them all at once. She said there is no chance that she's pregnant what should she do?

## 2023-05-08 NOTE — Telephone Encounter (Signed)
 Patient informed and was advised to take a test and call if she needed too

## 2023-05-09 ENCOUNTER — Other Ambulatory Visit: Payer: Self-pay

## 2023-05-09 MED ORDER — ONDANSETRON 4 MG PO TBDP: 4.0000 mg | ORAL_TABLET | Freq: Three times a day (TID) | ORAL | 0 refills | Status: DC | PRN

## 2023-05-22 ENCOUNTER — Encounter: Payer: Self-pay | Admitting: Cardiology

## 2023-05-22 ENCOUNTER — Ambulatory Visit: Payer: 59 | Admitting: Cardiology

## 2023-05-22 VITALS — BP 132/86 | HR 91 | Ht 65.0 in | Wt 283.0 lb

## 2023-05-22 DIAGNOSIS — R112 Nausea with vomiting, unspecified: Secondary | ICD-10-CM | POA: Insufficient documentation

## 2023-05-22 DIAGNOSIS — I10 Essential (primary) hypertension: Secondary | ICD-10-CM | POA: Diagnosis not present

## 2023-05-22 NOTE — Progress Notes (Signed)
 Established Patient Office Visit  Subjective:  Patient ID: Tara Woodward, female    DOB: 1990-09-20  Age: 33 y.o. MRN: [id number]  Chief Complaint  Patient presents with   Follow-up    4 Weeks Follow Up    Patient in office for 4 week follow up. Patient complains of nausea, vomiting after eating dinner every evening. Patient has been taking her medications every morning at the same time, including her levothyroxine. Recommend taking levothyroxine on an empty stomach, 30-60 minutes prior to breakfast. Patient will update office on symptoms in one week.     No other concerns at this time.   Past Medical History:  Diagnosis Date   Depression    HTN (hypertension)    Hypothyroidism    Morbid obesity with BMI of 45.0-49.9, adult (HCC)     History reviewed. No pertinent surgical history.  Social History   Socioeconomic History   Marital status: Married    Spouse name: Dorene Sorrow   Number of children: 2   Years of education: Not on file   Highest education level: High school graduate  Occupational History   Not on file  Tobacco Use   Smoking status: Never   Smokeless tobacco: Never  Vaping Use   Vaping status: Never Used  Substance and Sexual Activity   Alcohol use: No   Drug use: No   Sexual activity: Yes    Partners: Male    Birth control/protection: I.U.D.  Other Topics Concern   Not on file  Social History Narrative   Not on file   Social Drivers of Health   Financial Resource Strain: Not on file  Food Insecurity: Not on file  Transportation Needs: Not on file  Physical Activity: Not on file  Stress: Not on file  Social Connections: Not on file  Intimate Partner Violence: Not on file    Family History  Problem Relation Age of Onset   Drug abuse Mother    Depression Mother    Bipolar disorder Mother    Alcohol abuse Father    Drug abuse Father    Bipolar disorder Father    Healthy Sister    Hypertension Maternal Grandmother    Diabetes Maternal  Grandfather    Lung cancer Maternal Grandfather    Diabetes Maternal Aunt    Breast cancer Maternal Aunt    Hypertension Maternal Aunt    Colon cancer Neg Hx     No Known Allergies  Outpatient Medications Prior to Visit  Medication Sig   cholecalciferol (VITAMIN D3) 25 MCG (1000 UNIT) tablet Take 1,000 Units by mouth daily.   FLUoxetine (PROZAC) 10 MG capsule Take 1 capsule (10 mg total) by mouth daily.   hydrochlorothiazide (HYDRODIURIL) 25 MG tablet Take 1 tablet (25 mg total) by mouth daily.   levothyroxine (SYNTHROID) 150 MCG tablet Take 1 tablet (150 mcg total) by mouth daily.   lisinopril (ZESTRIL) 20 MG tablet Take 1 tablet (20 mg total) by mouth daily.   ondansetron (ZOFRAN-ODT) 4 MG disintegrating tablet Take 1 tablet (4 mg total) by mouth every 8 (eight) hours as needed for nausea or vomiting.   pravastatin (PRAVACHOL) 40 MG tablet Take 1 tablet (40 mg total) by mouth every evening.   [DISCONTINUED] omeprazole (PRILOSEC) 20 MG capsule Take 1 capsule (20 mg total) by mouth daily. (Patient not taking: Reported on 04/25/2023)   [DISCONTINUED] SLYND 4 MG TABS TAKE 1 TABLET BY MOUTH EVERY DAY (Patient not taking: Reported on 04/25/2023)   No  facility-administered medications prior to visit.    Review of Systems  Constitutional: Negative.   HENT: Negative.    Eyes: Negative.   Respiratory: Negative.  Negative for shortness of breath.   Cardiovascular: Negative.  Negative for chest pain.  Gastrointestinal:  Positive for diarrhea and vomiting. Negative for abdominal pain and constipation.  Genitourinary: Negative.   Musculoskeletal:  Negative for joint pain and myalgias.  Skin: Negative.   Neurological: Negative.  Negative for dizziness and headaches.  Endo/Heme/Allergies: Negative.   All other systems reviewed and are negative.      Objective:   BP 132/86   Pulse 91   Ht 5\' 5"  (1.651 m)   Wt 283 lb (128.4 kg)   SpO2 98%   BMI 47.09 kg/m   Vitals:   05/22/23 0938   BP: 132/86  Pulse: 91  Height: 5\' 5"  (1.651 m)  Weight: 283 lb (128.4 kg)  SpO2: 98%  BMI (Calculated): 47.09    Physical Exam Vitals and nursing note reviewed.  Constitutional:      Appearance: Normal appearance. She is normal weight.  HENT:     Head: Normocephalic and atraumatic.     Nose: Nose normal.     Mouth/Throat:     Mouth: Mucous membranes are moist.  Eyes:     Extraocular Movements: Extraocular movements intact.     Conjunctiva/sclera: Conjunctivae normal.     Pupils: Pupils are equal, round, and reactive to light.  Cardiovascular:     Rate and Rhythm: Normal rate and regular rhythm.     Pulses: Normal pulses.     Heart sounds: Normal heart sounds.  Pulmonary:     Effort: Pulmonary effort is normal.     Breath sounds: Normal breath sounds.  Abdominal:     General: Abdomen is flat. Bowel sounds are normal.     Palpations: Abdomen is soft.  Musculoskeletal:        General: Normal range of motion.     Cervical back: Normal range of motion.  Skin:    General: Skin is warm and dry.  Neurological:     General: No focal deficit present.     Mental Status: She is alert and oriented to person, place, and time.  Psychiatric:        Mood and Affect: Mood normal.        Behavior: Behavior normal.        Thought Content: Thought content normal.        Judgment: Judgment normal.      No results found for any visits on 05/22/23.  No results found for this or any previous visit (from the past 2160 hours).    Assessment & Plan:  Update office on symptoms in one week.  Problem List Items Addressed This Visit       Cardiovascular and Mediastinum   HTN (hypertension) - Primary     Digestive   Nausea and vomiting    Return in about 2 months (around 07/22/2023).   Total time spent: 25 minutes  [employer], NP  05/22/2023   This document may have been prepared by Dragon Voice Recognition software and as such may include unintentional dictation  errors.

## 2023-05-23 ENCOUNTER — Other Ambulatory Visit: Payer: Self-pay

## 2023-05-23 ENCOUNTER — Emergency Department
Admission: EM | Admit: 2023-05-23 | Discharge: 2023-05-23 | Disposition: A | Discharge status: Home / Self Care | Attending: Emergency Medicine | Admitting: Emergency Medicine

## 2023-05-23 DIAGNOSIS — L989 Disorder of the skin and subcutaneous tissue, unspecified: Secondary | ICD-10-CM | POA: Insufficient documentation

## 2023-05-23 DIAGNOSIS — O99891 Other specified diseases and conditions complicating pregnancy: Secondary | ICD-10-CM | POA: Insufficient documentation

## 2023-05-23 DIAGNOSIS — D492 Neoplasm of unspecified behavior of bone, soft tissue, and skin: Secondary | ICD-10-CM

## 2023-05-23 DIAGNOSIS — O26891 Other specified pregnancy related conditions, first trimester: Secondary | ICD-10-CM | POA: Diagnosis present

## 2023-05-23 DIAGNOSIS — Z3201 Encounter for pregnancy test, result positive: Secondary | ICD-10-CM | POA: Diagnosis not present

## 2023-05-23 DIAGNOSIS — Z3A01 Less than 8 weeks gestation of pregnancy: Secondary | ICD-10-CM | POA: Insufficient documentation

## 2023-05-23 LAB — POC URINE PREG, ED: Preg Test, Ur: POSITIVE — AB

## 2023-05-23 LAB — HCG, QUANTITATIVE, PREGNANCY: hCG, Beta Chain, Quant, S: 26509 m[IU]/mL — ABNORMAL HIGH (ref <5)

## 2023-05-23 MED ORDER — ONDANSETRON 4 MG PO TBDP: 4.0000 mg | ORAL_TABLET | Freq: Three times a day (TID) | ORAL | 0 refills | Status: DC | PRN

## 2023-05-23 MED ORDER — ONDANSETRON 8 MG PO TBDP
8.0000 mg | ORAL_TABLET | Freq: Once | ORAL | Status: DC
  Filled 2023-05-23: qty 1

## 2023-05-23 NOTE — Discharge Instructions (Signed)
 You have been diagnosed with a normal skin growth, pregnancy.  Please call [facility] and make an appointment.  Please call [facility] and make an appointment for follow-up of your pregnancy.  If you have new symptoms or symptoms worsen you can come back to ED or go to your PCP.  You can take Zofran 1 tablet 20 minutes before main meals every 8 hours for nauseas and vomit.

## 2023-05-23 NOTE — ED Triage Notes (Signed)
 PT complains of drainage coming from belly button x 1 week. Belly button started to hurt yesterday. Denies putting anything into belly button states it just randomly started draining and now has a smell.

## 2023-05-23 NOTE — ED Provider Notes (Signed)
 [facility] Provider Note    Event Date/Time   First MD Initiated Contact with Patient 05/23/23 Paulo Fruit     (approximate)   History   Drainage from Incision and Abdominal Pain   HPI  Tara Woodward is a 33 y.o. female who presents today with history of nauseous, vomit, and lesion in the umbilical area that is draining.      Physical Exam   Triage Vital Signs: ED Triage Vitals  Encounter Vitals Group     BP 05/23/23 1558 133/77     Systolic BP Percentile --      Diastolic BP Percentile --      Pulse Rate 05/23/23 1558 88     Resp 05/23/23 1558 20     Temp 05/23/23 1558 98.2 F (36.8 C)     Temp src --      SpO2 05/23/23 1558 98 %     Weight 05/23/23 1556 282 lb 3 oz (128 kg)     Height 05/23/23 1556 5\' 5"  (1.651 m)     Head Circumference --      Peak Flow --      Pain Score --      Pain Loc --      Pain Education --      Exclude from Growth Chart --     Most recent vital signs: Vitals:   05/23/23 1558 05/23/23 2002  BP: 133/77 126/88  Pulse: 88 77  Resp: 20 18  Temp: 98.2 F (36.8 C)   SpO2: 98% 99%     Constitutional: Alert, NAD. Able to speak in complete sentences without cough or dyspnea  Eyes: Conjunctivae are normal.  Head: Atraumatic. Nose: No congestion/rhinnorhea. Mouth/Throat: Mucous membranes are moist.   Neck: Painless ROM. Supple. No JVD, nodes, thyromegaly  Cardiovascular:   Good peripheral circulation.RRR no murmurs, gallops, rubs  Respiratory: Normal respiratory effort.  No retractions. Clear to auscultation bilaterally without wheezing or crackles  Gastrointestinal: Soft and nontender.  Musculoskeletal:  no deformity Neurologic:  MAE spontaneously. No gross focal neurologic deficits are appreciated.  Skin:  Skin is warm, dry and intact. No rash noted. Umbilical area: Presence of extra skin with black color, mild clear drainage Psychiatric: Mood and affect are normal. Speech and behavior are normal.    ED  Results / Procedures / Treatments   Labs (all labs ordered are listed, but only abnormal results are displayed) Labs Reviewed  HCG, QUANTITATIVE, PREGNANCY - Abnormal; Notable for the following components:      Result Value   hCG, Beta Chain, Quant, S 26,509 (*)    All other components within normal limits  POC URINE PREG, ED - Abnormal; Notable for the following components:   Preg Test, Ur Positive (*)    All other components within normal limits     EKG    RADIOLOGY    PROCEDURES:  Critical Care performed:   Procedures   MEDICATIONS ORDERED IN ED: Medications  ondansetron (ZOFRAN-ODT) disintegrating tablet 8 mg (8 mg Oral Patient Refused/Not Given 05/23/23 2053)   Clinical Course as of 05/23/23 2140  Wed May 23, 2023  2136 HCG, Beta Chain, Quant, S(!): 26,509 Estimated 4 weeks pregnancy [AE]    Clinical Course User Index [AE] Gladys Damme, PA-C    IMPRESSION / MDM / ASSESSMENT AND PLAN / ED COURSE  I reviewed the triage vital signs and the nursing notes.  Differential diagnosis includes, but is not limited to, pregnancy, cellulitis, abscess  Patient's presentation is most consistent with acute complicated illness / injury requiring diagnostic workup.  Patient's diagnosis is consistent with pregnancy, abnormal skin growth. I independently reviewed and interpreted imaging and agree with radiologists findings. Labs are  reassuring confirming unknown pregnancy and explaining symptomatology of nauseas and vomit. I did review the patient's allergies and medications.The patient is in stable and satisfactory condition for discharge home  Patient will be discharged home with prescriptions for Zofran. Patient is to follow up with dermatology, OB/GYN as needed or otherwise directed. Patient is given ED precautions to return to the ED for any worsening or new symptoms. Discussed plan of care with patient, answered all of patient's questions, Patient agreeable to plan of  care. Advised patient to take medications according to the instructions on the label. Discussed possible side effects of new medications. Patient verbalized understanding.    FINAL CLINICAL IMPRESSION(S) / ED DIAGNOSES   Final diagnoses:  Abnormal skin growth  Less than [redacted] weeks gestation of pregnancy     Rx / DC Orders   ED Discharge Orders          Ordered    ondansetron (ZOFRAN-ODT) 4 MG disintegrating tablet  Every 8 hours PRN        05/23/23 2139             Note:  This document was prepared using Dragon voice recognition software and may include unintentional dictation errors.   Gladys Damme, PA-C 05/23/23 2141    Minna Antis, MD 05/23/23 2315

## 2023-05-24 ENCOUNTER — Telehealth: Payer: Self-pay

## 2023-05-24 NOTE — Telephone Encounter (Signed)
 Patient Tara Woodward asking for call back since she found out last night at the ED that she's pregnant , she has questions about her medication

## 2023-05-24 NOTE — Telephone Encounter (Signed)
 Called patient back and she states she has everything under control now and no longer needed the advise

## 2023-05-25 ENCOUNTER — Telehealth: Payer: Self-pay | Admitting: Cardiology

## 2023-05-25 NOTE — Telephone Encounter (Signed)
Dahlia Client spoke with patient

## 2023-05-25 NOTE — Telephone Encounter (Signed)
 Patient left VM at 8:43 am that she has a question for her PCP or a nurse.

## 2023-05-30 ENCOUNTER — Ambulatory Visit: Admitting: Cardiology

## 2023-06-06 ENCOUNTER — Other Ambulatory Visit (INDEPENDENT_AMBULATORY_CARE_PROVIDER_SITE_OTHER): Payer: Self-pay

## 2023-06-06 ENCOUNTER — Ambulatory Visit (INDEPENDENT_AMBULATORY_CARE_PROVIDER_SITE_OTHER): Admitting: Family Medicine

## 2023-06-06 ENCOUNTER — Encounter: Payer: Self-pay | Admitting: Family Medicine

## 2023-06-06 ENCOUNTER — Other Ambulatory Visit (HOSPITAL_COMMUNITY)
Admission: RE | Admit: 2023-06-06 | Discharge: 2023-06-06 | Disposition: A | Discharge status: Home / Self Care | Source: Ambulatory Visit | Attending: Family Medicine | Admitting: Family Medicine

## 2023-06-06 VITALS — BP 151/100 | HR 92 | Wt 297.4 lb

## 2023-06-06 DIAGNOSIS — O0990 Supervision of high risk pregnancy, unspecified, unspecified trimester: Secondary | ICD-10-CM | POA: Insufficient documentation

## 2023-06-06 DIAGNOSIS — E079 Disorder of thyroid, unspecified: Secondary | ICD-10-CM | POA: Diagnosis not present

## 2023-06-06 DIAGNOSIS — O119 Pre-existing hypertension with pre-eclampsia, unspecified trimester: Secondary | ICD-10-CM | POA: Insufficient documentation

## 2023-06-06 DIAGNOSIS — O219 Vomiting of pregnancy, unspecified: Secondary | ICD-10-CM | POA: Insufficient documentation

## 2023-06-06 DIAGNOSIS — O10012 Pre-existing essential hypertension complicating pregnancy, second trimester: Secondary | ICD-10-CM

## 2023-06-06 DIAGNOSIS — O09899 Supervision of other high risk pregnancies, unspecified trimester: Secondary | ICD-10-CM

## 2023-06-06 DIAGNOSIS — Z2839 Other underimmunization status: Secondary | ICD-10-CM | POA: Diagnosis not present

## 2023-06-06 DIAGNOSIS — Z3A23 23 weeks gestation of pregnancy: Secondary | ICD-10-CM | POA: Diagnosis not present

## 2023-06-06 DIAGNOSIS — O99282 Endocrine, nutritional and metabolic diseases complicating pregnancy, second trimester: Secondary | ICD-10-CM

## 2023-06-06 DIAGNOSIS — O0992 Supervision of high risk pregnancy, unspecified, second trimester: Secondary | ICD-10-CM

## 2023-06-06 MED ORDER — NIFEDIPINE ER OSMOTIC RELEASE 30 MG PO TB24: 30.0000 mg | ORAL_TABLET | Freq: Every day | ORAL | 1 refills | Status: DC

## 2023-06-06 MED ORDER — SCOPOLAMINE 1 MG/3DAYS TD PT72
1.0000 | MEDICATED_PATCH | TRANSDERMAL | 12 refills | Status: DC
Start: 2023-06-06 — End: 2023-07-17

## 2023-06-06 MED ORDER — ASPIRIN 81 MG PO TBEC: 81.0000 mg | DELAYED_RELEASE_TABLET | Freq: Every day | ORAL | 3 refills | Status: DC

## 2023-06-06 NOTE — Progress Notes (Signed)
 Went to [facility] they only did HCG -estimated her around 4 weeks, pt then went to abortion clinic and they did an [place] estimating her around 22-23 weeks.   Was on OCP's until she found out as well as all her other medications

## 2023-06-06 NOTE — Progress Notes (Signed)
 Subjective:   Tara Woodward is a 33 y.o. G2P1002 at Unknown gestation being seen today for her first obstetrical visit.  Her obstetrical history is significant for obesity and hypertension . Pregnancy history fully reviewed.  Patient reports no complaints.  HISTORY: OB History  Gravida Para Term Preterm AB Living  2 1 1  0 0 2  SAB IAB Ectopic Multiple Live Births  0 0 0 1 2    # Outcome Date GA Lbr Len/2nd Weight Sex Type Anes PTL Lv  2 Current           1A Term 02/22/08 [redacted]w[redacted]d   M Vag-Spont   LIV  1B Term 02/22/08    F Vag-Spont   LIV    Obstetric Comments  Vag delivery x 2 (twins)   Last pap smear was  06/15/2021 and was normal Past Medical History:  Diagnosis Date   Depression    HTN (hypertension)    Hypothyroidism    Morbid obesity with BMI of 45.0-49.9, adult (HCC)    Past Surgical History:  Procedure Laterality Date   NO PAST SURGERIES     Family History  Problem Relation Age of Onset   Drug abuse Mother    Depression Mother    Bipolar disorder Mother    Alcohol abuse Father    Drug abuse Father    Bipolar disorder Father    Healthy Sister    Hypertension Maternal Grandmother    Diabetes Maternal Grandfather    Lung cancer Maternal Grandfather    Diabetes Maternal Aunt    Breast cancer Maternal Aunt    Hypertension Maternal Aunt    Colon cancer Neg Hx    Social History   Tobacco Use   Smoking status: Never   Smokeless tobacco: Never  Vaping Use   Vaping status: Never Used  Substance Use Topics   Alcohol use: No   Drug use: No   No Known Allergies Current Outpatient Medications on File Prior to Visit  Medication Sig Dispense Refill   FLUoxetine (PROZAC) 10 MG capsule Take 1 capsule (10 mg total) by mouth daily. 30 capsule 2   levothyroxine (SYNTHROID) 150 MCG tablet Take 1 tablet (150 mcg total) by mouth daily. 90 tablet 1   ondansetron (ZOFRAN-ODT) 4 MG disintegrating tablet Take 1 tablet (4 mg total) by mouth every 8 (eight) hours as  needed for nausea or vomiting. 20 tablet 0   cholecalciferol (VITAMIN D3) 25 MCG (1000 UNIT) tablet Take 1,000 Units by mouth daily.     No current facility-administered medications on file prior to visit.     Exam   Vitals:   06/06/23 1449  BP: (!) 151/100  Pulse: 92  Weight: 297 lb 6.4 oz (134.9 kg)   Fetal Heart Rate (bpm): 153  System: General: well-developed, well-nourished female in no acute distress   Skin: normal coloration and turgor, no rashes   Neurologic: oriented, normal, negative, normal mood   Extremities: normal strength, tone, and muscle mass, ROM of all joints is normal   HEENT extraocular movement intact and sclera clear, anicteric   Mouth/Teeth mucous membranes moist, pharynx normal without lesions and dental hygiene good   Neck supple and no masses   Cardiovascular: regular rate and rhythm   Respiratory:  no respiratory distress, normal breath sounds   Abdomen: soft, non-tender; bowel sounds normal; no masses,  no organomegaly     Assessment:   Pregnancy: [id number] Patient Active Problem List   Diagnosis Date  Noted   Supervision of high risk pregnancy, antepartum 06/06/2023   Nausea and vomiting 05/22/2023   Insomnia 10/04/2021   Thrombocytosis 10/04/2021   GERD (gastroesophageal reflux disease) 10/04/2021   Genital warts 06/15/2021   HLD (hyperlipidemia) 04/20/2021   Class 3 severe obesity due to excess calories with body mass index (BMI) of 45.0 to 49.9 in adult [facility]) 03/15/2021   Acquired hypothyroidism 03/15/2021   Major depression, recurrent (HCC) 11/08/2016   HTN (hypertension) 01/24/2013     Plan:  1. Supervision of high risk pregnancy, antepartum (Primary) New OB labs Bedside u/s does not show a lot of fluid. - US OB Limited; Future - [place] MFM OB DETAIL +14 WK; Future - Hemoglobin A1c - Genetic Screening - [place] Basic Panel - Culture, OB Urine - Cervicovaginal ancillary only( [place]) - PANORAMA PRENATAL TEST -  CBC/D/Plt+RPR+Rh+ABO+RubIgG...  2. Pre-existing essential hypertension during pregnancy in second trimester Stop Lisinopril and hydrochlorothiazide--begin Procardia Also on pravachol - advised to stop Risks of these meds also discussed. Add ASA Baseline labs - NIFEdipine (PROCARDIA XL) 30 MG 24 hr tablet; Take 1 tablet (30 mg total) by mouth daily.  Dispense: 30 tablet; Refill: 1 - aspirin EC 81 MG tablet; Take 1 tablet (81 mg total) by mouth daily.  Dispense: 90 tablet; Refill: 3 - Protein / creatinine ratio, urine - Comprehensive metabolic panel with GFR  3. Nausea and vomiting in pregnancy Not responding to zofran alone, add scope patch - scopolamine (TRANSDERM-SCOP) 1 MG/3DAYS; Place 1 patch (1.5 mg total) onto the skin every 3 (three) days.  Dispense: 10 patch; Refill: 12  4. Thyroid disease during pregnancy in second trimester Check TFTs and increase meds if needed. - TSH   Initial labs drawn. Continue prenatal vitamins. Genetic Screening discussed, NIPS: ordered. Ultrasound discussed; fetal anatomic survey: ordered. Problem list reviewed and updated. The nature of [facility] with multiple MDs and other Advanced Practice Providers was explained to patient; also emphasized that residents, students are part of our team. Routine obstetric precautions reviewed. Return in 4 weeks (on 07/04/2023).

## 2023-06-07 LAB — CBC/D/PLT+RPR+RH+ABO+RUBIGG...
Antibody Screen: NEGATIVE
Basophils Absolute: 0 10*3/uL (ref 0.0–0.2)
Basos: 0 %
EOS (ABSOLUTE): 0 10*3/uL (ref 0.0–0.4)
Eos: 0 %
HCV Ab: NONREACTIVE
HIV Screen 4th Generation wRfx: NONREACTIVE
Hematocrit: 33.7 % — ABNORMAL LOW (ref 34.0–46.6)
Hemoglobin: 11.6 g/dL (ref 11.1–15.9)
Hepatitis B Surface Ag: NEGATIVE
Immature Grans (Abs): 0 10*3/uL (ref 0.0–0.1)
Immature Granulocytes: 0 %
Lymphocytes Absolute: 1.8 10*3/uL (ref 0.7–3.1)
Lymphs: 18 %
MCH: 31 pg (ref 26.6–33.0)
MCHC: 34.4 g/dL (ref 31.5–35.7)
MCV: 90 fL (ref 79–97)
Monocytes Absolute: 0.5 10*3/uL (ref 0.1–0.9)
Monocytes: 5 %
Neutrophils Absolute: 7.9 10*3/uL — ABNORMAL HIGH (ref 1.4–7.0)
Neutrophils: 77 %
Platelets: 360 10*3/uL (ref 150–450)
RBC: 3.74 x10E6/uL — ABNORMAL LOW (ref 3.77–5.28)
RDW: 13.8 % (ref 11.7–15.4)
RPR Ser Ql: NONREACTIVE
Rh Factor: POSITIVE
Rubella Antibodies, IGG: <0.9 {index} — ABNORMAL LOW (ref >0.99)
WBC: 10.2 10*3/uL (ref 3.4–10.8)

## 2023-06-07 LAB — COMPREHENSIVE METABOLIC PANEL WITH GFR
ALT: 7 IU/L (ref 0–32)
AST: 11 IU/L (ref 0–40)
Albumin: 3.4 g/dL — ABNORMAL LOW (ref 3.9–4.9)
Alkaline Phosphatase: 115 IU/L (ref 44–121)
BUN/Creatinine Ratio: 20 (ref 9–23)
BUN: 8 mg/dL (ref 6–20)
Bilirubin Total: <0.2 mg/dL (ref 0.0–1.2)
CO2: 20 mmol/L (ref 20–29)
Calcium: 8.6 mg/dL — ABNORMAL LOW (ref 8.7–10.2)
Chloride: 98 mmol/L (ref 96–106)
Creatinine, Ser: 0.4 mg/dL — ABNORMAL LOW (ref 0.57–1.00)
Globulin, Total: 3 g/dL (ref 1.5–4.5)
Glucose: 84 mg/dL (ref 70–99)
Potassium: 4.1 mmol/L (ref 3.5–5.2)
Sodium: 134 mmol/L (ref 134–144)
Total Protein: 6.4 g/dL (ref 6.0–8.5)
eGFR: 133 mL/min/{1.73_m2} (ref >59)

## 2023-06-07 LAB — PROTEIN / CREATININE RATIO, URINE
Creatinine, Urine: 59.7 mg/dL
Protein, Ur: 11.5 mg/dL
Protein/Creat Ratio: 193 mg/g{creat} (ref 0–200)

## 2023-06-07 LAB — HCV INTERPRETATION

## 2023-06-07 LAB — HEMOGLOBIN A1C
Est. average glucose Bld gHb Est-mCnc: 105 mg/dL
Hgb A1c MFr Bld: 5.3 % (ref 4.8–5.6)

## 2023-06-07 LAB — TSH: TSH: 10.7 u[IU]/mL — ABNORMAL HIGH (ref 0.450–4.500)

## 2023-06-08 ENCOUNTER — Encounter: Payer: Self-pay | Admitting: Family Medicine

## 2023-06-08 DIAGNOSIS — Z2839 Other underimmunization status: Secondary | ICD-10-CM | POA: Insufficient documentation

## 2023-06-08 DIAGNOSIS — O09899 Supervision of other high risk pregnancies, unspecified trimester: Secondary | ICD-10-CM | POA: Insufficient documentation

## 2023-06-08 HISTORY — DX: Other underimmunization status: Z28.39

## 2023-06-08 LAB — CERVICOVAGINAL ANCILLARY ONLY
Chlamydia: NEGATIVE
Comment: NEGATIVE
Comment: NORMAL
Neisseria Gonorrhea: NEGATIVE

## 2023-06-08 LAB — URINE CULTURE, OB REFLEX

## 2023-06-08 LAB — CULTURE, OB URINE

## 2023-06-08 MED ORDER — LEVOTHYROXINE SODIUM 175 MCG PO TABS: 175.0000 ug | ORAL_TABLET | Freq: Every day | ORAL | 1 refills | Status: DC

## 2023-06-08 NOTE — Addendum Note (Signed)
 Addended by: Reva Bores on: 06/08/2023 08:40 AM   Modules accepted: Orders

## 2023-06-11 ENCOUNTER — Encounter: Payer: Self-pay | Admitting: *Deleted

## 2023-06-13 ENCOUNTER — Encounter: Admitting: Obstetrics and Gynecology

## 2023-06-13 DIAGNOSIS — Z01419 Encounter for gynecological examination (general) (routine) without abnormal findings: Secondary | ICD-10-CM

## 2023-06-13 DIAGNOSIS — Z3200 Encounter for pregnancy test, result unknown: Secondary | ICD-10-CM

## 2023-06-14 ENCOUNTER — Encounter: Payer: Self-pay | Admitting: Obstetrics and Gynecology

## 2023-06-14 LAB — PANORAMA PRENATAL TEST FULL PANEL:PANORAMA TEST PLUS 5 ADDITIONAL MICRODELETIONS: FETAL FRACTION: 17.1

## 2023-06-14 LAB — HORIZON CUSTOM: REPORT SUMMARY: NEGATIVE

## 2023-06-16 DIAGNOSIS — Z419 Encounter for procedure for purposes other than remedying health state, unspecified: Secondary | ICD-10-CM | POA: Diagnosis not present

## 2023-06-18 ENCOUNTER — Telehealth: Payer: Self-pay

## 2023-06-18 NOTE — Telephone Encounter (Signed)
 TC from ROB pt regarding excessive swelling and hard to walk or stand. Pt not able to check B/P at home no cuff notes taking B/P Rx, mild headaches no visual changes.  Pt made aware no provider/RN  here to advise at this time and no openings due to being short staffed today. Pt advised to report to [facility] for evaluation.  Pt agreeable and voiced understanding.

## 2023-06-21 ENCOUNTER — Ambulatory Visit: Attending: Obstetrics and Gynecology

## 2023-06-21 DIAGNOSIS — Z3A25 25 weeks gestation of pregnancy: Secondary | ICD-10-CM

## 2023-06-21 DIAGNOSIS — O99212 Obesity complicating pregnancy, second trimester: Secondary | ICD-10-CM

## 2023-06-21 NOTE — Progress Notes (Signed)
 Unable to contact pt for nurse intake. VM left.  New OB Intake  I connected with Tara Woodward  on 06/21/23 at 10:10 AM by phone and verified that I am speaking with the correct person using two identifiers. Nurse is located at [facility] and pt is located at home.   I explained I am completing New Patient Intake today. We discussed EDD of 09/28/2023, by Ultrasound. Pt is G2P1002. I reviewed her allergies, medications and Medical/Surgical/OB history.    Problem List There are no diagnoses linked to this encounter.  OB History  Gravida Para Term Preterm AB Living  2 1 1  0 0 2  SAB IAB Ectopic Multiple Live Births  0 0 0 1 2    # Outcome Date GA Lbr Len/2nd Weight Sex Type Anes PTL Lv  2 Current           1A Term 02/22/08 [redacted]w[redacted]d   M Vag-Spont   LIV  1B Term 02/22/08    F Vag-Spont   LIV    Obstetric Comments  Vag delivery x 2 (twins)     Past Medical History:  Diagnosis Date   Depression    HTN (hypertension)    Hypothyroidism    Morbid obesity with BMI of 45.0-49.9, adult (HCC)        Past Surgical History:  Procedure Laterality Date   NO PAST SURGERIES       Current Outpatient Medications on File Prior to Visit  Medication Sig Dispense Refill   aspirin EC 81 MG tablet Take 1 tablet (81 mg total) by mouth daily. 90 tablet 3   levothyroxine (SYNTHROID) 175 MCG tablet Take 1 tablet (175 mcg total) by mouth daily. 30 tablet 1   NIFEdipine (PROCARDIA XL) 30 MG 24 hr tablet Take 1 tablet (30 mg total) by mouth daily. 30 tablet 1   ondansetron (ZOFRAN-ODT) 4 MG disintegrating tablet Take 1 tablet (4 mg total) by mouth every 8 (eight) hours as needed for nausea or vomiting. 20 tablet 0   Prenatal Vit-Fe Fumarate-FA (PRENATAL PO) Take by mouth.     scopolamine (TRANSDERM-SCOP) 1 MG/3DAYS Place 1 patch (1.5 mg total) onto the skin every 3 (three) days. 10 patch 12   cholecalciferol (VITAMIN D3) 25 MCG (1000 UNIT) tablet Take 1,000 Units by mouth daily. (Patient not  taking: Reported on 06/21/2023)     FLUoxetine (PROZAC) 10 MG capsule Take 1 capsule (10 mg total) by mouth daily. (Patient not taking: Reported on 06/21/2023) 30 capsule 2   No current facility-administered medications on file prior to visit.      No Known Allergies   Patient advised of first appointment in our office and when to arrive.   All questions were answered.

## 2023-06-25 ENCOUNTER — Other Ambulatory Visit: Payer: Self-pay | Admitting: *Deleted

## 2023-06-25 ENCOUNTER — Ambulatory Visit: Attending: Family Medicine

## 2023-06-25 ENCOUNTER — Ambulatory Visit (HOSPITAL_BASED_OUTPATIENT_CLINIC_OR_DEPARTMENT_OTHER): Admitting: Obstetrics

## 2023-06-25 VITALS — BP 175/102 | HR 83

## 2023-06-25 VITALS — BP 170/113

## 2023-06-25 DIAGNOSIS — E039 Hypothyroidism, unspecified: Secondary | ICD-10-CM | POA: Diagnosis not present

## 2023-06-25 DIAGNOSIS — O09899 Supervision of other high risk pregnancies, unspecified trimester: Secondary | ICD-10-CM | POA: Diagnosis present

## 2023-06-25 DIAGNOSIS — O0992 Supervision of high risk pregnancy, unspecified, second trimester: Secondary | ICD-10-CM | POA: Diagnosis not present

## 2023-06-25 DIAGNOSIS — O99282 Endocrine, nutritional and metabolic diseases complicating pregnancy, second trimester: Secondary | ICD-10-CM | POA: Diagnosis not present

## 2023-06-25 DIAGNOSIS — E079 Disorder of thyroid, unspecified: Secondary | ICD-10-CM

## 2023-06-25 DIAGNOSIS — E66813 Obesity, class 3: Secondary | ICD-10-CM

## 2023-06-25 DIAGNOSIS — Z2839 Other underimmunization status: Secondary | ICD-10-CM | POA: Insufficient documentation

## 2023-06-25 DIAGNOSIS — O99213 Obesity complicating pregnancy, third trimester: Secondary | ICD-10-CM

## 2023-06-25 DIAGNOSIS — Z6841 Body Mass Index (BMI) 40.0 and over, adult: Secondary | ICD-10-CM | POA: Insufficient documentation

## 2023-06-25 DIAGNOSIS — Z3A26 26 weeks gestation of pregnancy: Secondary | ICD-10-CM | POA: Insufficient documentation

## 2023-06-25 DIAGNOSIS — O43892 Other placental disorders, second trimester: Secondary | ICD-10-CM | POA: Diagnosis not present

## 2023-06-25 DIAGNOSIS — Z79890 Hormone replacement therapy (postmenopausal): Secondary | ICD-10-CM | POA: Diagnosis not present

## 2023-06-25 DIAGNOSIS — O09522 Supervision of elderly multigravida, second trimester: Secondary | ICD-10-CM | POA: Insufficient documentation

## 2023-06-25 DIAGNOSIS — I10 Essential (primary) hypertension: Secondary | ICD-10-CM | POA: Diagnosis present

## 2023-06-25 DIAGNOSIS — E669 Obesity, unspecified: Secondary | ICD-10-CM

## 2023-06-25 DIAGNOSIS — O99212 Obesity complicating pregnancy, second trimester: Secondary | ICD-10-CM | POA: Diagnosis not present

## 2023-06-25 DIAGNOSIS — O10012 Pre-existing essential hypertension complicating pregnancy, second trimester: Secondary | ICD-10-CM | POA: Insufficient documentation

## 2023-06-25 DIAGNOSIS — Z79899 Other long term (current) drug therapy: Secondary | ICD-10-CM | POA: Insufficient documentation

## 2023-06-25 DIAGNOSIS — O10912 Unspecified pre-existing hypertension complicating pregnancy, second trimester: Secondary | ICD-10-CM | POA: Diagnosis present

## 2023-06-25 DIAGNOSIS — O10913 Unspecified pre-existing hypertension complicating pregnancy, third trimester: Secondary | ICD-10-CM

## 2023-06-25 DIAGNOSIS — O0990 Supervision of high risk pregnancy, unspecified, unspecified trimester: Secondary | ICD-10-CM | POA: Diagnosis present

## 2023-06-25 NOTE — Progress Notes (Signed)
 MFM Consult Note  Tara Woodward is currently at 26 weeks and 3 days.  She was seen due to maternal obesity with a BMI of 3, advanced maternal age, hypothyroidism currently treated with Synthroid , and chronic hypertension treated with Procardia  30 mg XL daily.  Her most recent TSH level was elevated (10.7).  Her blood pressures today were 175/102 and 170/113.  She denies any signs or symptoms of preeclampsia.  She had a cell free DNA test earlier in her pregnancy which indicated a low risk for trisomy 69, 73, and 13. A female fetus is predicted.   On today's exam, the overall EFW of 1 pound 15 ounces measures at the 21st percentile for her gestational age.  There was normal amniotic fluid noted.  There were no obvious fetal anomalies noted on today's ultrasound exam.  However, today's exam was limited due to the fetal position and maternal body habitus.  A possible anterior succenturiate lobe of the placenta was noted on today's exam.    The patient was advised that a succenturiate lobe is a separate accessory lobe of the placenta that may be connected to the main lobe of the placenta via a blood vessel.  There were no signs of vasa previa noted in the lower uterine segment today.    Care should be taken to ensure that the succenturiate lobe is removed after delivery.  We will continue to assess the placenta during her future exams.  The patient was informed that anomalies may be missed due to technical limitations. If the fetus is in a suboptimal position or maternal habitus is increased, visualization of the fetus in the maternal uterus may be impaired.  The following were discussed during today's consultation:  Chronic hypertension in pregnancy  The implications and management of chronic hypertension in pregnancy was discussed.   Due to her extremely elevated blood pressures, she was advised that the dosage of her Procardia  will most likely need to be increased.  The patient was hesitant to  go to the MAU for blood pressure monitoring today.    I advised her to take an extra 30 mg of Procardia  when she gets home and to call me later this afternoon regarding what her blood pressure is after she takes the additional 30 mg of Procardia .  The patient called me at 4:30 PM and told me that her blood pressure was 150/100 after taking an additional 30 mg of Procardia  when she got home.    I advised her to take an extra 30 mg of Procardia  this evening, making the total dosage of Procardia  90 mg daily.    I called the [facility] office and got her an appointment for a blood pressure check and medication dosage adjustment on June 26, 2023 (tomorrow afternoon).    She should be maintained on 90 mg of Procardia  XL daily.  Labetalol  may be added should her blood pressures continue to be elevated despite the increased Procardia  dose.  The increased risk of superimposed preeclampsia, an indicated preterm delivery, and possible fetal growth restriction due to chronic hypertension in pregnancy was discussed.  We will continue to follow her with monthly growth scans.   Weekly fetal testing should be started at around 32 weeks.   To decrease her risk of superimposed preeclampsia, she was advised to continue taking a daily baby aspirin  (81 mg daily) for preeclampsia prophylaxis.  Preeclampsia precautions were reviewed.  Advanced maternal age in pregnancy  The increased risk of fetal aneuploidy due to advanced  maternal age was discussed.   Due to advanced maternal age, the patient was offered and declined an amniocentesis today for definitive diagnosis of fetal aneuploidy.  She is comfortable with the low risk indicated by her cell free DNA test.  Hypothyroidism in pregnancy  The patient was advised to continue taking Synthroid  for the duration of her pregnancy.    She should have her thyroid  function tests (TSH and free T4 levels) monitored on a monthly basis to determine if any adjustments  to her Synthroid  dose is needed.  The patient stated that all of her questions were answered today.  A total of 60 minutes was spent counseling and coordinating the care for this patient.  Greater than 50% of the time was spent in direct face-to-face contact.

## 2023-06-26 ENCOUNTER — Ambulatory Visit (INDEPENDENT_AMBULATORY_CARE_PROVIDER_SITE_OTHER): Admitting: Obstetrics & Gynecology

## 2023-06-26 VITALS — BP 153/99 | HR 98

## 2023-06-26 DIAGNOSIS — O0990 Supervision of high risk pregnancy, unspecified, unspecified trimester: Secondary | ICD-10-CM | POA: Diagnosis not present

## 2023-06-26 DIAGNOSIS — O99282 Endocrine, nutritional and metabolic diseases complicating pregnancy, second trimester: Secondary | ICD-10-CM | POA: Diagnosis not present

## 2023-06-26 DIAGNOSIS — O10012 Pre-existing essential hypertension complicating pregnancy, second trimester: Secondary | ICD-10-CM | POA: Diagnosis not present

## 2023-06-26 DIAGNOSIS — Z3A26 26 weeks gestation of pregnancy: Secondary | ICD-10-CM

## 2023-06-26 DIAGNOSIS — E079 Disorder of thyroid, unspecified: Secondary | ICD-10-CM | POA: Diagnosis not present

## 2023-06-26 MED ORDER — LABETALOL HCL 200 MG PO TABS: 200.0000 mg | ORAL_TABLET | Freq: Three times a day (TID) | ORAL | 3 refills | Status: DC

## 2023-06-26 MED ORDER — NIFEDIPINE ER 60 MG PO TB24: 60.0000 mg | ORAL_TABLET | Freq: Two times a day (BID) | ORAL | 4 refills | Status: DC

## 2023-06-26 MED ORDER — ASPIRIN 81 MG PO TBEC: 162.0000 mg | DELAYED_RELEASE_TABLET | Freq: Every day | ORAL | 3 refills | Status: DC

## 2023-06-26 NOTE — Patient Instructions (Signed)
 Oral Glucose Tolerance Test During Pregnancy Why am I having this test? The oral glucose tolerance test (GTT) is done to check how your body processes blood sugar (glucose). This is one of several tests used to diagnose diabetes that develops during pregnancy (gestational diabetes mellitus). Gestational diabetes is a short-term form of diabetes that some women develop while they are pregnant. It usually occurs during the second or third trimester of pregnancy and goes away after delivery. Testing, or screening, for gestational diabetes usually occurs around 69 of pregnancy. This test may also be needed earlier if: You have a history of gestational diabetes. There is a history of giving birth to very large babies or of losing pregnancies (having stillbirths). You have signs and symptoms of diabetes, such as: Changes in your eyesight. Tingling or numbness in your hands or feet. Changes in hunger, thirst, and urination, and these are not explained by your pregnancy. What is being tested? This test measures the amount of glucose in your blood at different times during a period of 2 hours. This shows how well your body can process glucose.  You will have three separate blood draws. What kind of sample is taken?  Blood samples are required for this test. They are usually collected by inserting a needle into a blood vessel. How do I prepare for this test? For 3 days before your test, eat normally. Have plenty of carbohydrate-rich foods. You will be asked not to eat or drink anything other than water (to fast) starting 8-10 hours before the test. Tell a health care provider about: All medicines you are taking, including vitamins, herbs, eye drops, creams, and over-the-counter medicines. Any blood disorders you have. Any surgeries you have had. Any medical conditions you have. What happens during the test? First, your blood glucose will be measured. This is referred to as your fasting blood glucose  because you fasted before the test. Then, you will drink a glucose solution that contains a certain amount of glucose. Your blood glucose will be measured again 1 and 2 hours after you drink the solution. This test takes about 2 hours to complete. You will need to stay at the testing location during this time. During the testing period: Do not eat or drink anything other than the glucose solution. Do not exercise. Do not use any products that contain nicotine or tobacco, such as cigarettes, e-cigarettes, and chewing tobacco. These can affect your test results. If you need help quitting, ask your health care provider. The testing procedure may vary among health care providers and hospitals. How are the results reported? Your results will be reported as milligrams of glucose per deciliter of blood (mg/dL) or millimoles per liter (mmol/L). There is more than one source for screening and diagnosis reference values used to diagnose gestational diabetes. Your health care provider will compare your results to normal values that were established after testing a large group of people (reference values). Reference values may vary among labs and hospitals. For this test, reference values are: Fasting: 92 mg/dL 1 hour: 161 mg/dL  2 hour: 096 mg/dL   What do the results mean? Results below the reference values are considered normal. If one or more of your blood glucose levels are at or above the reference values, you will be diagnosed with gestational diabetes.  Talk with your health care provider about what your results mean. Questions to ask your health care provider Ask your health care provider, or the department that is doing the test: When  will my results be ready? How will I get my results? What are my treatment options? What other tests do I need? What are my next steps? Summary The oral glucose tolerance test (GTT) is one of several tests used to diagnose diabetes that develops during pregnancy  (gestational diabetes mellitus). Gestational diabetes is a short-term form of diabetes that some women develop while they are pregnant. You may also have this test if you have any symptoms or risk factors for this type of diabetes. Talk with your health care provider about what your results mean. This information is not intended to replace advice given to you by your health care provider. Make sure you discuss any questions you have with your health care provider.  TDaP Vaccine Pregnancy Get the Whooping Cough Vaccine While You Are Pregnant (CDC)  It is important for women to get the whooping cough vaccine in the third trimester of each pregnancy. Vaccines are the best way to prevent this disease. There are 2 different whooping cough vaccines. Both vaccines combine protection against whooping cough, tetanus and diphtheria, but they are for different age groups: Tdap: for everyone 11 years or older, including pregnant women  DTaP: for children 2 months through 45 years of age  You need the whooping cough vaccine during each of your pregnancies The recommended time to get the shot is during your 27th through 36th week of pregnancy, preferably during the earlier part of this time period. The Centers for Disease Control and Prevention (CDC) recommends that pregnant women receive the whooping cough vaccine for adolescents and adults (called Tdap vaccine) during the third trimester of each pregnancy. The recommended time to get the shot is during your 27th through 36th week of pregnancy, preferably during the earlier part of this time period. This replaces the original recommendation that pregnant women get the vaccine only if they had not previously received it. The [employer] and the [employer] support this recommendation.  You should get the whooping cough vaccine while pregnant to pass protection to your baby frame support disabled  and/or not supported in this browser  Learn why Tara Woodward decided to get the whooping cough vaccine in her 3rd trimester of pregnancy and how her baby girl was born with some protection against the disease. Also available on YouTube. After receiving the whooping cough vaccine, your body will create protective antibodies (proteins produced by the body to fight off diseases) and pass some of them to your baby before birth. These antibodies provide your baby some short-term protection against whooping cough in early life. These antibodies can also protect your baby from some of the more serious complications that come along with whooping cough. Your protective antibodies are at their highest about 2 weeks after getting the vaccine, but it takes time to pass them to your baby. So the preferred time to get the whooping cough vaccine is early in your third trimester. The amount of whooping cough antibodies in your body decreases over time. That is why CDC recommends you get a whooping cough vaccine during each pregnancy. Doing so allows each of your babies to get the greatest number of protective antibodies from you. This means each of your babies will get the best protection possible against this disease.  Getting the whooping cough vaccine while pregnant is better than getting the vaccine after you give birth Whooping cough vaccination during pregnancy is ideal so your baby will have short-term protection as soon as he  is born. This early protection is important because your baby will not start getting his whooping cough vaccines until he is 2 months old. These first few months of life are when your baby is at greatest risk for catching whooping cough. This is also when he's at greatest risk for having severe, potentially life-threating complications from the infection. To avoid that gap in protection, it is best to get a whooping cough vaccine during pregnancy. You will then pass protection to your baby before he  is born. To continue protecting your baby, he should get whooping cough vaccines starting at 2 months old. You may never have gotten the Tdap vaccine before and did not get it during this pregnancy. If so, you should make sure to get the vaccine immediately after you give birth, before leaving the hospital or birthing center. It will take about 2 weeks before your body develops protection (antibodies) in response to the vaccine. Once you have protection from the vaccine, you are less likely to give whooping cough to your newborn while caring for him. But remember, your baby will still be at risk for catching whooping cough from others. A recent study looked to see how effective Tdap was at preventing whooping cough in babies whose mothers got the vaccine while pregnant or in the hospital after giving birth. The study found that getting Tdap between 27 through 36 weeks of pregnancy is 85% more effective at preventing whooping cough in babies younger than 2 months old. Blood tests cannot tell if you need a whooping cough vaccine There are no blood tests that can tell you if you have enough antibodies in your body to protect yourself or your baby against whooping cough. Even if you have been sick with whooping cough in the past or previously received the vaccine, you still should get the vaccine during each pregnancy. Breastfeeding may pass some protective antibodies onto your baby By breastfeeding, you may pass some antibodies you have made in response to the vaccine to your baby. When you get a whooping cough vaccine during your pregnancy, you will have antibodies in your breast milk that you can share with your baby as soon as your milk comes in. However, your baby will not get protective antibodies immediately if you wait to get the whooping cough vaccine until after delivering your baby. This is because it takes about 2 weeks for your body to create antibodies. Learn more about the health benefits of  breastfeeding.

## 2023-06-26 NOTE — Progress Notes (Signed)
 PRENATAL VISIT NOTE  Subjective:  Tara Woodward is a 33 y.o. G2P1002 at [redacted]w[redacted]d being seen today for ongoing prenatal care.  She is currently monitored for the following issues for this high-risk pregnancy and has HTN (hypertension); Major depression, recurrent (HCC); Class 3 severe obesity due to excess calories with body mass index (BMI) of 45.0 to 49.9 in adult [facility]); Acquired hypothyroidism; HLD (hyperlipidemia); Genital warts; Insomnia; Thrombocytosis; GERD (gastroesophageal reflux disease); Supervision of high risk pregnancy, antepartum; Pre-existing essential hypertension during pregnancy in second trimester; Nausea and vomiting in pregnancy; Thyroid  disease during pregnancy in second trimester; Rubella non-immune status, antepartum; and Maternal morbid obesity, antepartum (HCC) on their problem list.  Patient reports no complaints.  Here with FOB.  Denies any headaches, visual symptoms, RUQ/epigastric pain or other concerning symptoms.  Contractions: Not present. Vag. Bleeding: None.  Movement: Present. Denies leaking of fluid.   The following portions of the patient's history were reviewed and updated as appropriate: allergies, current medications, past family history, past medical history, past social history, past surgical history and problem list.   Objective:   Vitals:   06/26/23 1341 06/26/23 1342  BP: (!) 160/104 (!) 153/99  Pulse: 98 98  Were 175/102 and 170/113 at MFM on 06/25/2023, patient took Procardia  XL 90 mg around 0730 today.  Fetal Status: Fetal Heart Rate (bpm): 131   Movement: Present     General:  Alert, oriented and cooperative. Patient is in no acute distress.  Skin: Skin is warm and dry. No rash noted.   Cardiovascular: Normal heart rate noted  Respiratory: Normal respiratory effort, no problems with respiration noted  Abdomen: Soft, gravid, appropriate for gestational age.  Pain/Pressure: Absent     Pelvic: Cervical exam deferred        Extremities: Normal  range of motion.  Edema: Mild pitting, slight indentation  Mental Status: Normal mood and affect. Normal behavior. Normal judgment and thought content.   US  MFM OB DETAIL +14 WK Result Date: 06/26/2023 ----------------------------------------------------------------------  OBSTETRICS REPORT                       (Signed Final 06/26/2023 10:37 am) ---------------------------------------------------------------------- Patient Info  ID #:       [id number]                          D.O.B.:  03/19/1990 (32 yrs)(F)  Name:       Tara Woodward                   Visit Date: 06/25/2023 09:14 am ---------------------------------------------------------------------- Performed By  Attending:        Sal Crass MD         Ref. Address:     [address]                                                             [address]  Performed By:     Marybeth Smock         [facility]  Referred By:      [facility] ---------------------------------------------------------------------- Orders  #  Description                           Code        Ordered By  1  US  MFM OB DETAIL +14 WK               76811.01    Tiffany Foerster ----------------------------------------------------------------------  #  Order #                     Accession #                Episode #  1  [id number]                   [id number]                 [id number] ---------------------------------------------------------------------- Indications  Obesity complicating pregnancy, second         O99.212  trimester (BMI 45)  Hypertension - Chronic/Pre-existing            O10.019  [redacted] weeks gestation of pregnancy                Z3A.26  Low risk NIPS Neg AFP NEG Horizon ---------------------------------------------------------------------- Vital  Signs  BP:          175/102 ---------------------------------------------------------------------- Fetal Evaluation  Num Of Fetuses:         1  Fetal Heart Rate(bpm):  134  Cardiac Activity:       Observed  Presentation:           Breech  Placenta:               Fundal with an extra lobe anterior  P. Cord Insertion:      Visualized  Amniotic Fluid  AFI FV:      Within normal limits  AFI Sum(cm)     %Tile       Largest Pocket(cm)  10.82           16          4.72  RUQ(cm)       RLQ(cm)       LUQ(cm)        LLQ(cm)  4.72          2.86          3.24           0 ---------------------------------------------------------------------- Biometry  BPD:      69.8  mm     G. Age:  28w 0d         88  %    CI:  76.04   %    70 - 86                                                          FL/HC:      18.0   %    18.6 - 20.4  HC:      253.7  mm     G. Age:  27w 4d         63  %    HC/AC:      1.18        1.04 - 1.22  AC:      215.3  mm     G. Age:  26w 0d         29  %    FL/BPD:     65.3   %    71 - 87  FL:       45.6  mm     G. Age:  25w 1d          7  %    FL/AC:      21.2   %    20 - 24  HUM:      43.4  mm     G. Age:  25w 6d         34  %  CER:      33.3  mm     G. Age:  28w 2d         93  %  LV:        5.1  mm  Est. FW:     873  gm    1 lb 15 oz      21  % ---------------------------------------------------------------------- OB History  Gravidity:    2         Term:   2        Prem:   0        SAB:   0  TOP:          0       Ectopic:  0        Living: 2 ---------------------------------------------------------------------- Gestational Age  U/S Today:     26w 5d                                        EDD:   09/26/23  Best:          26w 3d     Det. By:  U/S  (06/06/23)          EDD:   09/28/23 ---------------------------------------------------------------------- Targeted Anatomy  Central Nervous System  Calvarium/Cranial V.:  Appears normal         Cereb./Vermis:          Appears normal  Cavum:                 Appears  normal         Cisterna Magna:         Not well visualized  Lateral Ventricles:    Appears normal         Midline Falx:           Appears normal  Choroid  Plexus:        Appears normal  Spine  Cervical:              Limited                Sacral:                 Limited  Thoracic:              Limited                Shape/Curvature:        Appears normal  Lumbar:                Limited  Head/Neck  Lips:                  Appears normal         Profile:                Appears normal  Neck:                  Not well visualized    Orbits/Eyes:            Not well visualized  Nuchal Fold:           Not well visualized    Mandible:               Not well visualized  Nasal Bone:            Present                Maxilla:                Not well visualized  Thorax  4 Chamber View:        Appears normal         Interventr. Septum:     Appears normal  Cardiac Rhythm:        Normal                 Cardiac Axis:           Appears normal  Cardiac Situs:         Appears normal         Diaphragm:              Appears normal  Rt Outflow Tract:      Not well visualized    3 Vessel View:          Not well visualized  Lt Outflow Tract:      Appears normal         3 V Trachea View:       Not well visualized  Aortic Arch:           Not well visualized    IVC:                    Not well visualized  Ductal Arch:           Not well visualized    Crossing:               Appears normal  SVC:                   Not well visualized  Abdomen  Ventral Wall:          Appears normal         Lt Kidney:  Appears normal  Cord Insertion:        Appears normal         Rt Kidney:              Appears normal  Situs:                 Appears normal         Bladder:                Appears normal  Stomach:               Appears normal  Extremities  Lt Humerus:            Appears normal         Lt Femur:               Appears normal  Rt Humerus:            Not well visualized    Rt Femur:               Appears normal  Lt Forearm:            Appears  normal         Lt Lower Leg:           Appears normal  Rt Forearm:            Not well visualized    Rt Lower Leg:           Not well visualized  Lt Hand:               Not well visualized    Lt Foot:                Not well visualized  Rt Hand:               Not well visualized    Rt Foot:                Not well visualized  Other  Umbilical Cord:        Not well visualized ---------------------------------------------------------------------- Cervix Uterus Adnexa  Cervix  Length:            4.3  cm.  Normal appearance by transabdominal scan  Uterus  No abnormality visualized.  Right Ovary  Not visualized.  Left Ovary  Not visualized.  Cul De Sac  No free fluid seen.  Adnexa  No abnormality visualized ---------------------------------------------------------------------- Comments  Addilynn Belleville is currently at 26 weeks and 3 days.  She was  seen due to maternal obesity with a BMI of 49, advanced  maternal age, hypothyroidism currently treated with  Synthroid , and chronic hypertension treated with Procardia   30 mg XL daily.  Her most recent TSH level was elevated  (10.7).  Her blood pressures today were 175/102 and 170/113.  She  denies any signs or symptoms of preeclampsia.  She had a cell free DNA test earlier in her pregnancy which  indicated a low risk for trisomy 58, 70, and 13. A female fetus is  predicted.  On today's exam, the overall EFW of 1 pound 15 ounces  measures at the 21st percentile for her gestational age.  There was normal amniotic fluid noted.  There were no obvious fetal anomalies noted on today's  ultrasound exam.  However, today's exam was limited due to  the fetal position and maternal body habitus.  A possible anterior succenturiate lobe of the placenta was  noted  on today's exam.  The patient was advised that a succenturiate lobe is a  separate accessory lobe of the placenta that may be  connected to the main lobe of the placenta via a blood  vessel.  There were no signs of vasa previa noted in  the  lower uterine segment today.  Care should be taken to ensure that the succenturiate lobe is  removed after delivery.  We will continue to assess the  placenta during her future exams.  The patient was informed that anomalies may be missed due  to technical limitations. If the fetus is in a suboptimal position  or maternal habitus is increased, visualization of the fetus in  the maternal uterus may be impaired.  The following were discussed during today's consultation:  Chronic hypertension in pregnancy  The implications and management of chronic hypertension in  pregnancy was discussed.  Due to her extremely elevated blood pressures, she was  advised that the dosage of her Procardia  will most likely need  to be increased.  The patient was hesitant to go to the MAU  for blood pressure monitoring today.  I advised her to take an extra 30 mg of Procardia  when she  gets home and to call me later this afternoon regarding what  her blood pressure is after she takes the additional 30 mg of  Procardia .  The patient called me at 4:30 PM and told me that her blood  pressure was 150/100 after taking an additional 30 mg of  Procardia  when she got home.  I advised her to take an extra 30 mg of Procardia  this  evening, making the total dosage of Procardia  90 mg daily.  I called the [facility] office and got her an appointment for  a blood pressure check and medication dosage adjustment  on June 26, 2023 (tomorrow afternoon).  She should be maintained on 90 mg of Procardia  XL daily.  Labetalol  may be added should her blood pressures continue  to be elevated despite the increased Procardia  dose.  The increased risk of superimposed preeclampsia, an  indicated preterm delivery, and possible fetal growth  restriction due to chronic hypertension in pregnancy was  discussed.  We will continue to follow her with monthly growth scans.  Weekly fetal testing should be started at around 32 weeks.  To decrease her risk of superimposed  preeclampsia, she was  advised to continue taking a daily baby aspirin  (81 mg daily)  for preeclampsia prophylaxis.  Preeclampsia precautions were reviewed.  Advanced maternal age in pregnancy  The increased risk of fetal aneuploidy due to advanced  maternal age was discussed.  Due to advanced maternal age, the patient was offered and  declined an amniocentesis today for definitive diagnosis of  fetal aneuploidy.  She is comfortable with the low risk  indicated by her cell free DNA test.  Hypothyroidism in pregnancy  The patient was advised to continue taking Synthroid  for the  duration of her pregnancy.  She should have her thyroid  function tests (TSH and free T4  levels) monitored on a monthly basis to determine if any  adjustments to her Synthroid  dose is needed.  The patient stated that all of her questions were answered  today.  A total of 60 minutes was spent counseling and coordinating  the care for this patient.  Greater than 50% of the time was  spent in direct face-to-face contact. ----------------------------------------------------------------------  Sal Crass, MD Electronically Signed Final Report   06/26/2023 10:37 am ----------------------------------------------------------------------    Assessment and Plan:  Pregnancy: G2P1002 at [redacted]w[redacted]d 1. Pre-existing essential hypertension during pregnancy in second trimester (Primary) No symptoms of preeclampsia currently.  Labs checked today, will follow up results and manage accordingly.  Increased regimen of medications to Procardia  XL 60 mg po bid and Labetalol  200 mg po tid.  Patient unable to come in earlier than Tuesday next week for BP check, works at [employer], advised to check BP at work on Thursday/Friday (2-3 days from now). Aspirin  increased to 162 mg daily. Continue antenatal testing and scans as per MFM, will follow their recommendations.  Follow up next week for BP check - NIFEdipine  (ADALAT  CC) 60 MG 24 hr tablet; Take 1  tablet (60 mg total) by mouth 2 (two) times daily.  Dispense: 60 tablet; Refill: 4 - labetalol  (NORMODYNE ) 200 MG tablet; Take 1 tablet (200 mg total) by mouth 3 (three) times daily.  Dispense: 90 tablet; Refill: 3 - aspirin  EC 81 MG tablet; Take 2 tablets (162 mg total) by mouth daily.  Dispense: 180 tablet; Refill: 3  2. Thyroid  disease during pregnancy in second trimester On Synthroid  175 mcg daily, will check labs next visit with other labs. - TSH; Future - T4, free; Future - T3, free; Future  3. [redacted] weeks gestation of pregnancy 4. Supervision of high risk pregnancy, antepartum GTT and other labs to be checked next week. Future orders placed. - Glucose Tolerance, 2 Hours w/1 Hour; Future - CBC; Future - RPR; Future - HIV Antibody (routine testing w rflx); Future - Comprehensive metabolic panel with GFR; Future Preterm labor symptoms and general obstetric precautions including but not limited to vaginal bleeding, contractions, leaking of fluid and fetal movement were reviewed in detail with the patient. Please refer to After Visit Summary for other counseling recommendations.   Return in about 1 week (around 07/03/2023) for 2 hr GTT, 3rd trimester labs, TDap, OFFICE OB VISIT (MD only).  Future Appointments  Date Time Provider Department Center  07/03/2023  8:00 AM [facility] LAB [facility]  07/03/2023  8:15 AM Ayvion Kavanagh, Kathrine Paris, MD [facility]  07/10/2023  2:30 PM Priscilla Finklea, Kathrine Paris, MD [facility]  07/24/2023  8:00 AM [facility] PROVIDER 1 [facility]  07/24/2023  8:30 AM [facility] US3 [facility]  07/24/2023 10:00 AM Scoggins, Amber, NP AMA-AMA None  08/07/2023 11:00 AM [facility] PROVIDER 1 [facility]  08/07/2023 11:30 AM [facility] US3 [facility]  08/14/2023 11:00 AM [facility] PROVIDER 1 [facility]  08/14/2023 11:30 AM [facility] US2 [facility]  08/21/2023 10:00 AM [facility] PROVIDER 1 [facility]  08/21/2023 10:30 AM [facility] US2 [facility]    Lenoard Rad,  MD

## 2023-06-27 LAB — CBC
Hematocrit: 35 % (ref 34.0–46.6)
Hemoglobin: 11.5 g/dL (ref 11.1–15.9)
MCH: 29.9 pg (ref 26.6–33.0)
MCHC: 32.9 g/dL (ref 31.5–35.7)
MCV: 91 fL (ref 79–97)
Platelets: 409 10*3/uL (ref 150–450)
RBC: 3.84 x10E6/uL (ref 3.77–5.28)
RDW: 14.5 % (ref 11.7–15.4)
WBC: 9.9 10*3/uL (ref 3.4–10.8)

## 2023-06-27 LAB — COMPREHENSIVE METABOLIC PANEL WITH GFR
ALT: 6 IU/L (ref 0–32)
AST: 9 IU/L (ref 0–40)
Albumin: 3.3 g/dL — ABNORMAL LOW (ref 3.9–4.9)
Alkaline Phosphatase: 149 IU/L — ABNORMAL HIGH (ref 44–121)
BUN/Creatinine Ratio: 15 (ref 9–23)
BUN: 7 mg/dL (ref 6–20)
Bilirubin Total: <0.2 mg/dL (ref 0.0–1.2)
CO2: 22 mmol/L (ref 20–29)
Calcium: 8.7 mg/dL (ref 8.7–10.2)
Chloride: 102 mmol/L (ref 96–106)
Creatinine, Ser: 0.47 mg/dL — ABNORMAL LOW (ref 0.57–1.00)
Globulin, Total: 2.7 g/dL (ref 1.5–4.5)
Glucose: 78 mg/dL (ref 70–99)
Potassium: 4.2 mmol/L (ref 3.5–5.2)
Sodium: 138 mmol/L (ref 134–144)
Total Protein: 6 g/dL (ref 6.0–8.5)
eGFR: 128 mL/min/{1.73_m2} (ref >59)

## 2023-06-27 LAB — PROTEIN / CREATININE RATIO, URINE
Creatinine, Urine: 167.7 mg/dL
Protein, Ur: 176.9 mg/dL
Protein/Creat Ratio: 1055 mg/g{creat} — ABNORMAL HIGH (ref 0–200)

## 2023-06-28 ENCOUNTER — Encounter: Payer: Self-pay | Admitting: Obstetrics & Gynecology

## 2023-07-01 ENCOUNTER — Other Ambulatory Visit: Payer: Self-pay

## 2023-07-01 ENCOUNTER — Encounter (HOSPITAL_COMMUNITY): Payer: Self-pay | Admitting: *Deleted

## 2023-07-01 ENCOUNTER — Inpatient Hospital Stay (HOSPITAL_COMMUNITY)
Admission: AD | Admit: 2023-07-01 | Discharge: 2023-07-03 | DRG: 833 | Disposition: A | Discharge status: Home / Self Care | Attending: Obstetrics and Gynecology | Admitting: Obstetrics and Gynecology

## 2023-07-01 DIAGNOSIS — O99282 Endocrine, nutritional and metabolic diseases complicating pregnancy, second trimester: Secondary | ICD-10-CM | POA: Diagnosis not present

## 2023-07-01 DIAGNOSIS — Z3689 Encounter for other specified antenatal screening: Secondary | ICD-10-CM

## 2023-07-01 DIAGNOSIS — Z7982 Long term (current) use of aspirin: Secondary | ICD-10-CM

## 2023-07-01 DIAGNOSIS — Z8249 Family history of ischemic heart disease and other diseases of the circulatory system: Secondary | ICD-10-CM

## 2023-07-01 DIAGNOSIS — R609 Edema, unspecified: Secondary | ICD-10-CM

## 2023-07-01 DIAGNOSIS — E039 Hypothyroidism, unspecified: Secondary | ICD-10-CM | POA: Diagnosis not present

## 2023-07-01 DIAGNOSIS — O112 Pre-existing hypertension with pre-eclampsia, second trimester: Principal | ICD-10-CM

## 2023-07-01 DIAGNOSIS — Z79890 Hormone replacement therapy (postmenopausal): Secondary | ICD-10-CM | POA: Diagnosis not present

## 2023-07-01 DIAGNOSIS — Z833 Family history of diabetes mellitus: Secondary | ICD-10-CM

## 2023-07-01 DIAGNOSIS — O10912 Unspecified pre-existing hypertension complicating pregnancy, second trimester: Secondary | ICD-10-CM | POA: Diagnosis present

## 2023-07-01 DIAGNOSIS — O119 Pre-existing hypertension with pre-eclampsia, unspecified trimester: Principal | ICD-10-CM | POA: Diagnosis present

## 2023-07-01 DIAGNOSIS — O1493 Unspecified pre-eclampsia, third trimester: Secondary | ICD-10-CM | POA: Diagnosis not present

## 2023-07-01 DIAGNOSIS — Z3A27 27 weeks gestation of pregnancy: Secondary | ICD-10-CM

## 2023-07-01 DIAGNOSIS — O0990 Supervision of high risk pregnancy, unspecified, unspecified trimester: Secondary | ICD-10-CM

## 2023-07-01 DIAGNOSIS — E079 Disorder of thyroid, unspecified: Secondary | ICD-10-CM | POA: Diagnosis present

## 2023-07-01 DIAGNOSIS — O133 Gestational [pregnancy-induced] hypertension without significant proteinuria, third trimester: Secondary | ICD-10-CM | POA: Diagnosis not present

## 2023-07-01 DIAGNOSIS — O1203 Gestational edema, third trimester: Secondary | ICD-10-CM | POA: Diagnosis not present

## 2023-07-01 LAB — COMPREHENSIVE METABOLIC PANEL WITH GFR
ALT: 10 U/L (ref 0–44)
AST: 14 U/L — ABNORMAL LOW (ref 15–41)
Albumin: 2.3 g/dL — ABNORMAL LOW (ref 3.5–5.0)
Alkaline Phosphatase: 103 U/L (ref 38–126)
Anion gap: 10 (ref 5–15)
BUN: 8 mg/dL (ref 6–20)
CO2: 21 mmol/L — ABNORMAL LOW (ref 22–32)
Calcium: 8.5 mg/dL — ABNORMAL LOW (ref 8.9–10.3)
Chloride: 106 mmol/L (ref 98–111)
Creatinine, Ser: 0.51 mg/dL (ref 0.44–1.00)
GFR, Estimated: >60 mL/min (ref >60)
Glucose, Bld: 81 mg/dL (ref 70–99)
Potassium: 3.8 mmol/L (ref 3.5–5.1)
Sodium: 137 mmol/L (ref 135–145)
Total Bilirubin: 0.4 mg/dL (ref 0.0–1.2)
Total Protein: 6 g/dL — ABNORMAL LOW (ref 6.5–8.1)

## 2023-07-01 LAB — URINALYSIS, ROUTINE W REFLEX MICROSCOPIC
Glucose, UA: NEGATIVE mg/dL
Hgb urine dipstick: NEGATIVE
Ketones, ur: 5 mg/dL — AB
Leukocytes,Ua: NEGATIVE
Nitrite: NEGATIVE
Protein, ur: >=300 mg/dL — AB
Specific Gravity, Urine: 1.028 (ref 1.005–1.030)
pH: 5 (ref 5.0–8.0)

## 2023-07-01 LAB — CBC WITH DIFFERENTIAL/PLATELET
Abs Immature Granulocytes: 0.03 10*3/uL (ref 0.00–0.07)
Basophils Absolute: 0 10*3/uL (ref 0.0–0.1)
Basophils Relative: 0 %
Eosinophils Absolute: 0 10*3/uL (ref 0.0–0.5)
Eosinophils Relative: 0 %
HCT: 32.9 % — ABNORMAL LOW (ref 36.0–46.0)
Hemoglobin: 11.4 g/dL — ABNORMAL LOW (ref 12.0–15.0)
Immature Granulocytes: 0 %
Lymphocytes Relative: 17 %
Lymphs Abs: 1.7 10*3/uL (ref 0.7–4.0)
MCH: 31.3 pg (ref 26.0–34.0)
MCHC: 34.7 g/dL (ref 30.0–36.0)
MCV: 90.4 fL (ref 80.0–100.0)
Monocytes Absolute: 0.6 10*3/uL (ref 0.1–1.0)
Monocytes Relative: 6 %
Neutro Abs: 7.5 10*3/uL (ref 1.7–7.7)
Neutrophils Relative %: 77 %
Platelets: 406 10*3/uL — ABNORMAL HIGH (ref 150–400)
RBC: 3.64 MIL/uL — ABNORMAL LOW (ref 3.87–5.11)
RDW: 14.6 % (ref 11.5–15.5)
WBC: 9.8 10*3/uL (ref 4.0–10.5)
nRBC: 0 % (ref 0.0–0.2)

## 2023-07-01 LAB — RAPID HIV SCREEN (HIV 1/2 AB+AG)
HIV 1/2 Antibodies: NONREACTIVE
HIV-1 P24 Antigen - HIV24: NONREACTIVE

## 2023-07-01 LAB — PROTEIN / CREATININE RATIO, URINE
Creatinine, Urine: 261 mg/dL
Protein Creatinine Ratio: 0.8 mg/mg{creat} — ABNORMAL HIGH (ref 0.00–0.15)
Total Protein, Urine: 209 mg/dL

## 2023-07-01 LAB — TYPE AND SCREEN
ABO/RH(D): A POS
Antibody Screen: NEGATIVE

## 2023-07-01 LAB — GLUCOSE TOLERANCE, 1 HOUR: Glucose, 1 Hour GTT: 108 mg/dL

## 2023-07-01 MED ORDER — ACETAMINOPHEN 325 MG PO TABS: 650.0000 mg | ORAL_TABLET | ORAL | Status: DC | PRN

## 2023-07-01 MED ORDER — ZOLPIDEM TARTRATE 5 MG PO TABS: 5.0000 mg | ORAL_TABLET | Freq: Every evening | ORAL | Status: DC | PRN

## 2023-07-01 MED ORDER — HYDRALAZINE HCL 20 MG/ML IJ SOLN: 5.0000 mg | INTRAMUSCULAR | Status: DC | PRN

## 2023-07-01 MED ORDER — LABETALOL HCL 5 MG/ML IV SOLN: 40.0000 mg | INTRAVENOUS | Status: DC | PRN

## 2023-07-01 MED ORDER — SODIUM CHLORIDE 0.9% FLUSH: 3.0000 mL | INTRAVENOUS | Status: DC | PRN

## 2023-07-01 MED ORDER — ASPIRIN 81 MG PO TBEC
162.0000 mg | DELAYED_RELEASE_TABLET | Freq: Every day | ORAL | Status: DC
  Administered 2023-07-02 – 2023-07-03 (×2): 162 mg via ORAL
  Filled 2023-07-01 (×2): qty 2

## 2023-07-01 MED ORDER — BETAMETHASONE SOD PHOS & ACET 6 (3-3) MG/ML IJ SUSP
12.0000 mg | INTRAMUSCULAR | Status: AC
  Administered 2023-07-01 – 2023-07-02 (×2): 12 mg via INTRAMUSCULAR
  Filled 2023-07-01: qty 5

## 2023-07-01 MED ORDER — HYDRALAZINE HCL 20 MG/ML IJ SOLN: 10.0000 mg | INTRAMUSCULAR | Status: DC | PRN

## 2023-07-01 MED ORDER — CALCIUM CARBONATE ANTACID 500 MG PO CHEW: 2.0000 | CHEWABLE_TABLET | ORAL | Status: DC | PRN

## 2023-07-01 MED ORDER — LABETALOL HCL 5 MG/ML IV SOLN: 20.0000 mg | INTRAVENOUS | Status: DC | PRN

## 2023-07-01 MED ORDER — LEVOTHYROXINE SODIUM 25 MCG PO TABS: 175.0000 ug | ORAL_TABLET | Freq: Every day | ORAL | Status: DC

## 2023-07-01 MED ORDER — PRENATAL MULTIVITAMIN CH
1.0000 | ORAL_TABLET | Freq: Every day | ORAL | Status: DC
  Administered 2023-07-02 – 2023-07-03 (×2): 1 via ORAL
  Filled 2023-07-01 (×2): qty 1

## 2023-07-01 MED ORDER — LABETALOL HCL 200 MG PO TABS
200.0000 mg | ORAL_TABLET | Freq: Three times a day (TID) | ORAL | Status: DC
  Administered 2023-07-01 – 2023-07-03 (×5): 200 mg via ORAL
  Filled 2023-07-01 (×5): qty 1

## 2023-07-01 MED ORDER — SODIUM CHLORIDE 0.9% FLUSH
3.0000 mL | Freq: Two times a day (BID) | INTRAVENOUS | Status: DC
  Administered 2023-07-01 – 2023-07-03 (×4): 10 mL via INTRAVENOUS

## 2023-07-01 MED ORDER — DOCUSATE SODIUM 100 MG PO CAPS
100.0000 mg | ORAL_CAPSULE | Freq: Every day | ORAL | Status: DC
  Administered 2023-07-02: 100 mg via ORAL
  Filled 2023-07-01: qty 1

## 2023-07-01 MED ORDER — NIFEDIPINE ER OSMOTIC RELEASE 60 MG PO TB24
60.0000 mg | ORAL_TABLET | Freq: Two times a day (BID) | ORAL | Status: DC
  Administered 2023-07-01 – 2023-07-03 (×4): 60 mg via ORAL
  Filled 2023-07-01 (×4): qty 1

## 2023-07-01 NOTE — H&P (Signed)
 Chief Complaint:  Hypertension and Pre-Eclampsia   HPI   None     Tara Woodward is a 33 y.o. G2P1002 at [redacted]w[redacted]d who presents to maternity admissions reporting previous diagnosis with CHTN with SIPE. She now reports bilateral lower extremity edema. She denies HA, visual disturbances, and "shakiness". She reports this shakiness is similar to when "you have a fever or get chilled". She endorses good FM and denies VB/LOF. These complaints started yesterday.   Pregnancy Course: CHTN with SIPE previously diagnosed, Hypothyroidism, morbid obesity   Past Medical History:  Diagnosis Date   Depression    HTN (hypertension)    Hypothyroidism    Morbid obesity with BMI of 45.0-49.9, adult (HCC)    OB History  Gravida Para Term Preterm AB Living  2 1 1  0 0 2  SAB IAB Ectopic Multiple Live Births  0 0 0 1 2    # Outcome Date GA Lbr Len/2nd Weight Sex Type Anes PTL Lv  2 Current           1A Term 02/22/08 [redacted]w[redacted]d   M Vag-Spont   LIV  1B Term 02/22/08    F Vag-Spont   LIV    Obstetric Comments  Vag delivery x 2 (twins)   Past Surgical History:  Procedure Laterality Date   NO PAST SURGERIES     Family History  Problem Relation Age of Onset   Drug abuse Mother    Depression Mother    Bipolar disorder Mother    Alcohol abuse Father    Drug abuse Father    Bipolar disorder Father    Healthy Sister    Hypertension Maternal Grandmother    Diabetes Maternal Grandfather    Lung cancer Maternal Grandfather    Diabetes Maternal Aunt    Breast cancer Maternal Aunt    Hypertension Maternal Aunt    Colon cancer Neg Hx    Social History   Tobacco Use   Smoking status: Never   Smokeless tobacco: Never  Vaping Use   Vaping status: Never Used  Substance Use Topics   Alcohol use: Not Currently   Drug use: No   No Known Allergies Medications Prior to Admission  Medication Sig Dispense Refill Last Dose/Taking   aspirin  EC 81 MG tablet Take 2 tablets (162 mg total) by mouth daily. 180  tablet 3 07/01/2023 at  7:00 AM   labetalol  (NORMODYNE ) 200 MG tablet Take 1 tablet (200 mg total) by mouth 3 (three) times daily. 90 tablet 3 07/01/2023 at  1:30 PM   levothyroxine  (SYNTHROID ) 175 MCG tablet Take 1 tablet (175 mcg total) by mouth daily. 30 tablet 1 07/01/2023 at  7:00 AM   NIFEdipine  (ADALAT  CC) 60 MG 24 hr tablet Take 1 tablet (60 mg total) by mouth 2 (two) times daily. 60 tablet 4 07/01/2023 at  7:00 AM   Prenatal Vit-Fe Fumarate-FA (PRENATAL PO) Take by mouth.   07/01/2023 at  7:00 AM   cholecalciferol (VITAMIN D3) 25 MCG (1000 UNIT) tablet Take 1,000 Units by mouth daily. (Patient not taking: Reported on 06/21/2023)   Not Taking   FLUoxetine  (PROZAC ) 10 MG capsule Take 1 capsule (10 mg total) by mouth daily. (Patient not taking: Reported on 06/21/2023) 30 capsule 2 Not Taking   ondansetron  (ZOFRAN -ODT) 4 MG disintegrating tablet Take 1 tablet (4 mg total) by mouth every 8 (eight) hours as needed for nausea or vomiting. 20 tablet 0    scopolamine  (TRANSDERM-SCOP) 1 MG/3DAYS Place 1 patch (1.5  mg total) onto the skin every 3 (three) days. 10 patch 12     I have reviewed patient's Past Medical Hx, Surgical Hx, Family Hx, Social Hx, medications and allergies.   ROS  Pertinent items noted in HPI and remainder of comprehensive ROS otherwise negative.   PHYSICAL EXAM  Patient Vitals for the past 24 hrs:  BP Temp Pulse Resp SpO2 Height Weight  07/01/23 1847 (!) 146/92 -- 85 -- -- -- --  07/01/23 1831 (!) 154/99 -- 78 -- -- -- --  07/01/23 1826 (!) 142/100 -- 82 -- -- -- --  07/01/23 1822 -- -- -- -- -- 5\' 5"  (1.651 m) (!) 143.6 kg  07/01/23 1749 -- -- -- -- -- 5\' 5"  (1.651 m) 134.9 kg  07/01/23 1748 (!) 155/122 97.8 F (36.6 C) 84 19 99 % -- --    Physical Exam Vitals and nursing note reviewed.  Constitutional:      General: She is not in acute distress.    Appearance: Normal appearance. She is obese. She is not ill-appearing, toxic-appearing or diaphoretic.  Cardiovascular:      Rate and Rhythm: Normal rate.  Pulmonary:     Effort: Pulmonary effort is normal.  Musculoskeletal:        General: Normal range of motion.     Right lower leg: 2+ Pitting Edema present.     Left lower leg: 2+ Pitting Edema present.  Skin:    General: Skin is warm and dry.     Capillary Refill: Capillary refill takes less than 2 seconds.  Neurological:     Mental Status: She is alert and oriented to person, place, and time.     Deep Tendon Reflexes:     Reflex Scores:      Patellar reflexes are 2+ on the right side and 2+ on the left side.    Comments: Negative clonus  Psychiatric:        Mood and Affect: Mood normal.        Behavior: Behavior normal.        Thought Content: Thought content normal.        Judgment: Judgment normal.   Fetal Tracing: Baseline: 130 Variability: moderate Accelerations: 15x15 Decelerations:absent Toco: UI   Labs: Results for orders placed or performed during the hospital encounter of 07/01/23 (from the past 24 hours)  Comprehensive metabolic panel     Status: Abnormal   Collection Time: 07/01/23  7:21 PM  Result Value Ref Range   Sodium 137 135 - 145 mmol/L   Potassium 3.8 3.5 - 5.1 mmol/L   Chloride 106 98 - 111 mmol/L   CO2 21 (L) 22 - 32 mmol/L   Glucose, Bld 81 70 - 99 mg/dL   BUN 8 6 - 20 mg/dL   Creatinine, Ser 6.57 0.44 - 1.00 mg/dL   Calcium  8.5 (L) 8.9 - 10.3 mg/dL   Total Protein 6.0 (L) 6.5 - 8.1 g/dL   Albumin 2.3 (L) 3.5 - 5.0 g/dL   AST 14 (L) 15 - 41 U/L   ALT 10 0 - 44 U/L   Alkaline Phosphatase 103 38 - 126 U/L   Total Bilirubin 0.4 0.0 - 1.2 mg/dL   GFR, Estimated >84 >69 mL/min   Anion gap 10 5 - 15  CBC with Differential/Platelet     Status: Abnormal   Collection Time: 07/01/23  7:21 PM  Result Value Ref Range   WBC 9.8 4.0 - 10.5 K/uL   RBC 3.64 (L) 3.87 -  5.11 MIL/uL   Hemoglobin 11.4 (L) 12.0 - 15.0 g/dL   HCT 95.2 (L) 84.1 - 32.4 %   MCV 90.4 80.0 - 100.0 fL   MCH 31.3 26.0 - 34.0 pg   MCHC 34.7 30.0  - 36.0 g/dL   RDW 40.1 02.7 - 25.3 %   Platelets 406 (H) 150 - 400 K/uL   nRBC 0.0 0.0 - 0.2 %   Neutrophils Relative % 77 %   Neutro Abs 7.5 1.7 - 7.7 K/uL   Lymphocytes Relative 17 %   Lymphs Abs 1.7 0.7 - 4.0 K/uL   Monocytes Relative 6 %   Monocytes Absolute 0.6 0.1 - 1.0 K/uL   Eosinophils Relative 0 %   Eosinophils Absolute 0.0 0.0 - 0.5 K/uL   Basophils Relative 0 %   Basophils Absolute 0.0 0.0 - 0.1 K/uL   Immature Granulocytes 0 %   Abs Immature Granulocytes 0.03 0.00 - 0.07 K/uL    Imaging:  No results found.  MDM & MAU COURSE  MDM: Reviewed EMR PreE labs: CBC, CMP PC ratio PE  MAU Course: Orders Placed This Encounter  Procedures   Protein / creatinine ratio, urine   Comprehensive metabolic panel   CBC with Differential/Platelet   Urinalysis, Routine w reflex microscopic -Urine, Clean Catch   Notify physician (specify) Confirmatory reading of BP> 160/110 15 minutes later   Apply Hypertensive Disorders of Pregnancy Care Plan   Vital signs   Measure blood pressure   Meds ordered this encounter  Medications   AND Linked Order Group    hydrALAZINE (APRESOLINE) injection 5 mg    hydrALAZINE (APRESOLINE) injection 10 mg    labetalol  (NORMODYNE ) injection 20 mg    labetalol  (NORMODYNE ) injection 40 mg    ASSESSMENT   1. Chronic hypertension affecting pregnancy   2. Chronic hypertension with superimposed preeclampsia   3. Edema, unspecified type   4. [redacted] weeks gestation of pregnancy   5. NST (non-stress test) reactive   - Concern for worsening CHTN with SIPE.   PLAN  Consultation with Dr. Vallarie Gauze who recommends inpatient admission to better assess therapeutic range for blood pressure medication.  Consideration of GTT and administration of BMZ in the event early delivery needed.   Admit to OBSCU.   Allergies as of 07/01/2023   No Known Allergies     Raford Bunk, MSN, CNM 07/01/2023 8:09 PM  Certified Nurse Midwife, [facility]

## 2023-07-01 NOTE — MAU Note (Signed)
.  Tara Woodward is a 33 y.o. at [redacted]w[redacted]d here in MAU reporting: Was diagnosed on Thursday with PreE, and has since developed increased BLE edema, worsening headaches, and "shakiness" Reports positive FM, denies bleeding/LOF.  LMP: unknown Onset of complaint: 06/30/23 Pain score: 2/10 Vitals:   07/01/23 1748  BP: (!) 155/122  Pulse: 84  Resp: 19  Temp: 97.8 F (36.6 C)  SpO2: 99%     FHT:  135 Lab orders placed from triage:

## 2023-07-01 NOTE — ED Triage Notes (Signed)
 The pt is 7 months pregnant and she was told this past Thursday at the [facility] at [facility]  in [place] that she was pre eclamptic  and if she   felt shakey and had feet and ankles swollen to come to this hospital

## 2023-07-01 NOTE — ED Notes (Addendum)
 Report given to the nurse on mau - pt on the way there

## 2023-07-01 NOTE — ED Provider Triage Note (Signed)
 Emergency Medicine Provider Triage Evaluation Note  Tara Woodward , a 33 y.o. female  was evaluated in triage.  Pt complains of hypertension and preeclampsia.  Patient states she went to [facility] on Thursday and was told she was preeclamptic and given blood pressure meds and sent home.  Patient states now both of her legs are swelling and that she is having the shakes but is not had a seizure.  Patient denies any vision changes, headaches, vaginal bleeding.  Patient is currently 7 months pregnant.  Review of Systems  Positive:  Negative:   Physical Exam  BP (!) 155/122   Pulse 84   Temp 97.8 F (36.6 C)   Resp 19   Ht 5\' 5"  (1.651 m)   Wt 134.9 kg   LMP  (LMP Unknown)   SpO2 99%   BMI 49.49 kg/m  Gen:   Awake, no distress   Resp:  Normal effort  MSK:   Moves extremities without difficulty  Other:  Nonpitting edema bilaterally, some tremors noted  Medical Decision Making  Medically screening exam initiated at 5:59 PM.  Appropriate orders placed.  Tara Woodward was informed that the remainder of the evaluation will be completed by another provider, this initial triage assessment does not replace that evaluation, and the importance of remaining in the ED until their evaluation is complete.  Patient came in with reported preeclampsia diagnosis on Thursday and upon arrival had a blood pressure 155/122 with leg swelling and some minor tremors.  Patient has not had any seizures however given the previous diagnosis MAU APP Tara Woodward was notified immediately and patient will be sent to the MAU.  MAU APP states that they will draw labs there.   Tara Finn, PA-C 07/01/23 1800

## 2023-07-02 DIAGNOSIS — Z3A27 27 weeks gestation of pregnancy: Secondary | ICD-10-CM | POA: Diagnosis not present

## 2023-07-02 DIAGNOSIS — O112 Pre-existing hypertension with pre-eclampsia, second trimester: Secondary | ICD-10-CM | POA: Diagnosis not present

## 2023-07-02 LAB — RPR: RPR Ser Ql: NONREACTIVE

## 2023-07-02 LAB — TSH: TSH: 39.115 u[IU]/mL — ABNORMAL HIGH (ref 0.350–4.500)

## 2023-07-02 MED ORDER — LEVOTHYROXINE SODIUM 25 MCG PO TABS
250.0000 ug | ORAL_TABLET | Freq: Every day | ORAL | Status: DC
  Administered 2023-07-03: 250 ug via ORAL
  Filled 2023-07-02: qty 10

## 2023-07-02 MED ORDER — LEVOTHYROXINE SODIUM 25 MCG PO TABS
50.0000 ug | ORAL_TABLET | Freq: Once | ORAL | Status: AC
  Administered 2023-07-02: 50 ug via ORAL
  Filled 2023-07-02: qty 2

## 2023-07-02 MED ORDER — LEVOTHYROXINE SODIUM 25 MCG PO TABS
200.0000 ug | ORAL_TABLET | Freq: Every day | ORAL | Status: DC
  Administered 2023-07-02: 200 ug via ORAL
  Filled 2023-07-02: qty 8

## 2023-07-02 NOTE — Progress Notes (Signed)
 32yoF G2P1002 at [redacted]w[redacted]d with PMH of hypothyroidism, depression, cHTN who is admitted for cHTN with SIPE. TSH three weeks ago (4/2) was 10.7 and dose of levothyroxine  was increased to 175mcg daily. TSH on admission (4/27) was 39.1, substantially increased from previous level. OB specialty team requested pharmacy speak to patient regarding home medication administration to further investigate recent increase in TSH despite dose increase of levothyroxine .  Medications Prior to Admission  Medication Sig Dispense Refill   aspirin  EC 81 MG tablet Take 2 tablets (162 mg total) by mouth daily. 180 tablet 3   labetalol  (NORMODYNE ) 200 MG tablet Take 1 tablet (200 mg total) by mouth 3 (three) times daily. 90 tablet 3   levothyroxine  (SYNTHROID ) 175 MCG tablet Take 1 tablet (175 mcg total) by mouth daily. 30 tablet 1   NIFEdipine  (ADALAT  CC) 60 MG 24 hr tablet Take 1 tablet (60 mg total) by mouth 2 (two) times daily. 60 tablet 4   Prenatal Vit-Fe Fumarate-FA (PRENATAL PO) Take by mouth.     cholecalciferol (VITAMIN D3) 25 MCG (1000 UNIT) tablet Take 1,000 Units by mouth daily. (Patient not taking: Reported on 06/21/2023)     FLUoxetine  (PROZAC ) 10 MG capsule Take 1 capsule (10 mg total) by mouth daily. (Patient not taking: Reported on 06/21/2023) 30 capsule 2   ondansetron  (ZOFRAN -ODT) 4 MG disintegrating tablet Take 1 tablet (4 mg total) by mouth every 8 (eight) hours as needed for nausea or vomiting. 20 tablet 0   scopolamine  (TRANSDERM-SCOP) 1 MG/3DAYS Place 1 patch (1.5 mg total) onto the skin every 3 (three) days. 10 patch 12     Patient reports taking all home medications at the same time around 0800 with a small quick meal while at work. This is a consistent practice for her and has not changed within the last month, likely not contributing to the TSH increase. She also reports not having any major changes in what she eats for breakfast.  She recently started taking a prenatal vitamin about 3 weeks ago,  around the time of the dose increase of levothyroxine  and she continued to take all medications at the same time of day. This is likely contributing to the decreased absorption of levothyroxine .  After counseling on levothyroxine  administration best practices (take levothyroxine  QAM and separate from meals, multivitamins, supplements, herbals, etc by at least 1 hour) and making her aware that this is how she is receiving her medications inpatient currently, patient is amenable to continuing this practice at discharge.   Jerrel Mor, PharmD PGY1 Pharmacy Resident 07/02/2023 8:59 AM

## 2023-07-02 NOTE — Progress Notes (Signed)
 FACULTY PRACTICE ANTEPARTUM COMPREHENSIVE PROGRESS NOTE  Tara Woodward is a 33 y.o. G2P1002 at [redacted]w[redacted]d who is admitted for [facility] with SIPE.  Estimated Date of Delivery: 09/28/23 Fetal presentation is unsure.  Length of Stay:  1 Days. Admitted 07/01/2023  Subjective: Feeling well this morning. No HA/BV/RUQ pain.   Patient reports good fetal movement.  She reports no uterine contractions, no bleeding and no loss of fluid per vagina.  Vitals:  Blood pressure 129/80, pulse 85, temperature 98 F (36.7 C), resp. rate 18, height 5\' 5"  (1.651 m), weight (!) 143.6 kg, SpO2 98%. Physical Examination: CONSTITUTIONAL: Well-developed, well-nourished female in no acute distress.  NEUROLOGIC: Alert and oriented to person, place, and time. No cranial nerve deficit noted. PSYCHIATRIC: Normal mood and affect. Normal behavior. Normal judgment and thought content. CARDIOVASCULAR: Normal heart rate noted, regular rhythm RESPIRATORY: Effort and breath sounds normal, no problems with respiration noted MUSCULOSKELETAL: Normal range of motion. No edema and no tenderness. 2+ distal pulses. ABDOMEN: Soft, nontender, nondistended, gravid. CERVIX:  Not examined  Fetal monitoring: FHR: 120 bpm, Variability: moderate, Accelerations: Present, Decelerations: Absent  Uterine activity: no contractions per hour  Results for orders placed or performed during the hospital encounter of 07/01/23 (from the past 48 hours)  Protein / creatinine ratio, urine     Status: Abnormal   Collection Time: 07/01/23  7:20 PM  Result Value Ref Range   Creatinine, Urine 261 mg/dL   Total Protein, Urine 209 mg/dL    Comment: RESULT CONFIRMED BY MANUAL DILUTION NO NORMAL RANGE ESTABLISHED FOR THIS TEST    Protein Creatinine Ratio 0.80 (H) 0.00 - 0.15 mg/mg[Cre]    Comment: Performed at [facility], [address] [place], [place] [address]  Urinalysis, Routine w reflex microscopic -Urine, Clean Catch     Status: Abnormal    Collection Time: 07/01/23  7:20 PM  Result Value Ref Range   Color, Urine AMBER (A) YELLOW    Comment: BIOCHEMICALS MAY BE AFFECTED BY COLOR   APPearance HAZY (A) CLEAR   Specific Gravity, Urine 1.028 1.005 - 1.030   pH 5.0 5.0 - 8.0   Glucose, UA NEGATIVE NEGATIVE mg/dL   Hgb urine dipstick NEGATIVE NEGATIVE   Bilirubin Urine SMALL (A) NEGATIVE   Ketones, ur 5 (A) NEGATIVE mg/dL   Protein, ur >=604 (A) NEGATIVE mg/dL   Nitrite NEGATIVE NEGATIVE   Leukocytes,Ua NEGATIVE NEGATIVE   RBC / HPF 0-5 0 - 5 RBC/hpf   WBC, UA 0-5 0 - 5 WBC/hpf   Bacteria, UA FEW (A) NONE SEEN   Squamous Epithelial / HPF 0-5 0 - 5 /HPF   Mucus PRESENT     Comment: Performed at [facility], [address], [place], [place] [address]  Comprehensive metabolic panel     Status: Abnormal   Collection Time: 07/01/23  7:21 PM  Result Value Ref Range   Sodium 137 135 - 145 mmol/L   Potassium 3.8 3.5 - 5.1 mmol/L   Chloride 106 98 - 111 mmol/L   CO2 21 (L) 22 - 32 mmol/L   Glucose, Bld 81 70 - 99 mg/dL    Comment: Glucose reference range applies only to samples taken after fasting for at least 8 hours.   BUN 8 6 - 20 mg/dL   Creatinine, Ser 1.19 0.44 - 1.00 mg/dL   Calcium  8.5 (L) 8.9 - 10.3 mg/dL   Total Protein 6.0 (L) 6.5 - 8.1 g/dL   Albumin 2.3 (L) 3.5 -  5.0 g/dL   AST 14 (L) 15 - 41 U/L   ALT 10 0 - 44 U/L   Alkaline Phosphatase 103 38 - 126 U/L   Total Bilirubin 0.4 0.0 - 1.2 mg/dL   GFR, Estimated >13 >08 mL/min    Comment: (NOTE) Calculated using the CKD-EPI Creatinine Equation (2021)    Anion gap 10 5 - 15    Comment: Performed at [facility] Lab, [address], [place], [place] [address]  CBC with Differential/Platelet     Status: Abnormal   Collection Time: 07/01/23  7:21 PM  Result Value Ref Range   WBC 9.8 4.0 - 10.5 K/uL   RBC 3.64 (L) 3.87 - 5.11 MIL/uL   Hemoglobin 11.4 (L) 12.0 - 15.0 g/dL   HCT 69.6 (L) 29.5 - 28.4 %   MCV 90.4 80.0 - 100.0 fL   MCH 31.3 26.0 - 34.0 pg    MCHC 34.7 30.0 - 36.0 g/dL   RDW 13.2 44.0 - 10.2 %   Platelets 406 (H) 150 - 400 K/uL   nRBC 0.0 0.0 - 0.2 %   Neutrophils Relative % 77 %   Neutro Abs 7.5 1.7 - 7.7 K/uL   Lymphocytes Relative 17 %   Lymphs Abs 1.7 0.7 - 4.0 K/uL   Monocytes Relative 6 %   Monocytes Absolute 0.6 0.1 - 1.0 K/uL   Eosinophils Relative 0 %   Eosinophils Absolute 0.0 0.0 - 0.5 K/uL   Basophils Relative 0 %   Basophils Absolute 0.0 0.0 - 0.1 K/uL   Immature Granulocytes 0 %   Abs Immature Granulocytes 0.03 0.00 - 0.07 K/uL    Comment: Performed at [facility], [address], [place], [place] [address]  Type and screen [facility]     Status: None   Collection Time: 07/01/23 10:53 PM  Result Value Ref Range   ABO/RH(D) A POS    Antibody Screen NEG    Sample Expiration      07/04/2023,2359 Performed at [facility] Lab, [address], [place], [place] [address]   Glucose tolerance, 1 hour     Status: None   Collection Time: 07/01/23 10:53 PM  Result Value Ref Range   Glucose, 1 Hour GTT 108 mg/dL    Comment: Performed at [facility] Lab, [address], [place], [place] [address]  TSH     Status: Abnormal   Collection Time: 07/01/23 10:53 PM  Result Value Ref Range   TSH 39.115 (H) 0.350 - 4.500 uIU/mL    Comment: Performed by a 3rd Generation assay with a functional sensitivity of <=0.01 uIU/mL. Performed at [facility], [address] [place], [place] [address]   Rapid HIV screen (HIV 1/2 Ab+Ag)     Status: None   Collection Time: 07/01/23 10:53 PM  Result Value Ref Range   HIV-1 P24 Antigen - HIV24 NON REACTIVE NON REACTIVE    Comment: (NOTE) Detection of p24 may be inhibited by biotin in the sample, causing false negative results in acute infection.    HIV 1/2 Antibodies NON REACTIVE NON REACTIVE   Interpretation (HIV Ag Ab)      A non reactive test result means that HIV 1 or HIV 2 antibodies and HIV 1 p24 antigen were not detected in the  specimen.    Comment: Performed at [facility] Lab, [address], [place], [place] [address]    No results found.  Current scheduled  medications  aspirin  EC  162 mg Oral Daily   betamethasone acetate-betamethasone sodium phosphate   12 mg Intramuscular Q24H   docusate sodium  100 mg Oral Daily   labetalol   200 mg Oral Q8H   levothyroxine   200 mcg Oral Q0600   NIFEdipine   60 mg Oral BID   prenatal multivitamin  1 tablet Oral Q1200   sodium chloride  flush  3-10 mL Intravenous Q12H    I have reviewed the patient's current medications.  ASSESSMENT: Principal Problem:   Chronic hypertension with superimposed preeclampsia Active Problems:   Supervision of high risk pregnancy, antepartum   Thyroid  disease during pregnancy in second trimester   PLAN: CHTN with SIPE - BPs very well controlled on current regimen - BMZ started  Hypothyroidism - TSH 39 - Patient reports taking her synthroid  as prescribed - Will add on T3 this morning after discussion with Dr. Cathyann Cobia.  - Pharmacy will check on method of administering synthroid  - Will increased synthroid  to 250mcg - This may be the source of her LE edema and other symptoms  FWB - NST reactive. Continue daily NST  Routine PNC - GTT and 28 wk labs wnl   Continue routine antenatal care.  Lacey Pian, MD, FACOG Obstetrician & Gynecologist, [facility] for [facility], [facility]

## 2023-07-03 ENCOUNTER — Encounter: Admitting: Obstetrics & Gynecology

## 2023-07-03 ENCOUNTER — Other Ambulatory Visit

## 2023-07-03 DIAGNOSIS — O112 Pre-existing hypertension with pre-eclampsia, second trimester: Secondary | ICD-10-CM | POA: Diagnosis not present

## 2023-07-03 DIAGNOSIS — Z3A27 27 weeks gestation of pregnancy: Secondary | ICD-10-CM | POA: Diagnosis not present

## 2023-07-03 LAB — T3: T3, Total: 186 ng/dL — ABNORMAL HIGH (ref 71–180)

## 2023-07-03 MED ORDER — LEVOTHYROXINE SODIUM 125 MCG PO TABS: 250.0000 ug | ORAL_TABLET | Freq: Every day | ORAL | 1 refills | Status: DC

## 2023-07-03 MED ORDER — LABETALOL HCL 200 MG PO TABS: 200.0000 mg | ORAL_TABLET | Freq: Three times a day (TID) | ORAL | 2 refills | Status: DC

## 2023-07-03 NOTE — Discharge Summary (Addendum)
 Physician Discharge Summary  Patient ID: Tara Woodward MRN: [id number] DOB/AGE: [birth year] 33 y.o.  Admit date: 07/01/2023 Discharge date: 07/03/2023  Admission Diagnoses:  Discharge Diagnoses:  Principal Problem:   Chronic hypertension with superimposed preeclampsia Active Problems:   Supervision of high risk pregnancy, antepartum   Thyroid  disease during pregnancy in second trimester   Discharged Condition: good  Hospital Course: Patient admitted for observation with diagnosis of CHTN with superimposed pre-eclampsia without severe features. Patient completed a 1 hour glucose screening test before completing a course of BMZ. Her blood pressure remained stable and well controlled on procardia  and labetalol  which was increased to TID dosing. Patient was noted to have an elevated TSH. Her synthroid  was increased from 175 mcg to 250 mcg. It was later discovered that the patient was taking all her medications at the same time. Education was provided on that topic. Plan to follow up T3 and TSH outpatient and adjust dosing accordingly. Patient stable for discharge home with plans to follow up next week. Precautions reviewed  Consults: None   Discharge Exam: Blood pressure 130/73, pulse 93, temperature 97.6 F (36.4 C), temperature source Oral, resp. rate 18, height 5\' 5"  (1.651 m), weight (!) 143.6 kg, SpO2 100%. GENERAL: Well-developed, well-nourished female in no acute distress.  LUNGS: Clear to auscultation bilaterally.  HEART: Regular rate and rhythm. ABDOMEN: Soft, nontender, nondistended. gravid PELVIC: Not indicated EXTREMITIES: No cyanosis, clubbing, or edema, 2+ distal pulses.  FHT: baseline 130, mod variability, + accels, no decels TOCO: no contractions  Disposition: Home   Allergies as of 07/03/2023   No Known Allergies      Medication List     TAKE these medications    aspirin  EC 81 MG tablet Take 2 tablets (162 mg total) by mouth daily.   cholecalciferol 25  MCG (1000 UNIT) tablet Commonly known as: VITAMIN D3 Take 1,000 Units by mouth daily.   FLUoxetine  10 MG capsule Commonly known as: PROZAC  Take 1 capsule (10 mg total) by mouth daily.   labetalol  200 MG tablet Commonly known as: NORMODYNE  Take 1 tablet (200 mg total) by mouth every 8 (eight) hours. What changed: when to take this   levothyroxine  175 MCG tablet Commonly known as: Synthroid  Take 1 tablet (175 mcg total) by mouth daily. What changed: Another medication with the same name was added. Make sure you understand how and when to take each.   levothyroxine  125 MCG tablet Commonly known as: SYNTHROID  Take 2 tablets (250 mcg total) by mouth daily at 6 (six) AM. Start taking on: July 04, 2023 What changed: You were already taking a medication with the same name, and this prescription was added. Make sure you understand how and when to take each.   NIFEdipine  60 MG 24 hr tablet Commonly known as: ADALAT  CC Take 1 tablet (60 mg total) by mouth 2 (two) times daily.   ondansetron  4 MG disintegrating tablet Commonly known as: ZOFRAN -ODT Take 1 tablet (4 mg total) by mouth every 8 (eight) hours as needed for nausea or vomiting.   PRENATAL PO Take by mouth.   scopolamine  1 MG/3DAYS Commonly known as: TRANSDERM-SCOP Place 1 patch (1.5 mg total) onto the skin every 3 (three) days.        Follow-up Information     [facility] for [facility] Follow up in 1 week(s).   Specialty: Obstetrics and Gynecology Why: Please call to schedule follow up appointment in 1 week Contact information: [address]  [address] [place]  [phone]                Signed: Verlyn Goad 07/03/2023, 10:30 AM

## 2023-07-03 NOTE — Plan of Care (Signed)
  Problem: Education: Goal: Knowledge of disease or condition will improve Outcome: Completed/Met Goal: Knowledge of the prescribed therapeutic regimen will improve Outcome: Completed/Met   Problem: Fluid Volume: Goal: Peripheral tissue perfusion will improve Outcome: Completed/Met   Problem: Clinical Measurements: Goal: Complications related to disease process, condition or treatment will be avoided or minimized Outcome: Completed/Met   Problem: Education: Goal: Knowledge of disease or condition will improve Outcome: Completed/Met Goal: Knowledge of the prescribed therapeutic regimen will improve Outcome: Completed/Met Goal: Individualized Educational Video(s) Outcome: Completed/Met   Problem: Clinical Measurements: Goal: Complications related to the disease process, condition or treatment will be avoided or minimized Outcome: Completed/Met   Problem: Health Behavior/Discharge Planning: Goal: Ability to manage health-related needs will improve Outcome: Completed/Met   Problem: Clinical Measurements: Goal: Ability to maintain clinical measurements within normal limits will improve Outcome: Completed/Met Goal: Will remain free from infection Outcome: Completed/Met Goal: Diagnostic test results will improve Outcome: Completed/Met Goal: Respiratory complications will improve Outcome: Completed/Met Goal: Cardiovascular complication will be avoided Outcome: Completed/Met   Problem: Activity: Goal: Risk for activity intolerance will decrease Outcome: Completed/Met   Problem: Coping: Goal: Level of anxiety will decrease Outcome: Completed/Met   Problem: Elimination: Goal: Will not experience complications related to bowel motility Outcome: Completed/Met Goal: Will not experience complications related to urinary retention Outcome: Completed/Met   Problem: Pain Managment: Goal: General experience of comfort will improve and/or be controlled Outcome: Completed/Met    Problem: Safety: Goal: Ability to remain free from injury will improve Outcome: Completed/Met   Problem: Skin Integrity: Goal: Risk for impaired skin integrity will decrease Outcome: Completed/Met

## 2023-07-05 ENCOUNTER — Encounter: Admitting: Obstetrics and Gynecology

## 2023-07-10 ENCOUNTER — Ambulatory Visit (INDEPENDENT_AMBULATORY_CARE_PROVIDER_SITE_OTHER): Admitting: Obstetrics & Gynecology

## 2023-07-10 ENCOUNTER — Encounter: Payer: Self-pay | Admitting: Obstetrics & Gynecology

## 2023-07-10 VITALS — BP 143/80 | HR 80 | Wt 323.4 lb

## 2023-07-10 DIAGNOSIS — O0990 Supervision of high risk pregnancy, unspecified, unspecified trimester: Secondary | ICD-10-CM

## 2023-07-10 DIAGNOSIS — O119 Pre-existing hypertension with pre-eclampsia, unspecified trimester: Secondary | ICD-10-CM

## 2023-07-10 DIAGNOSIS — Z3A28 28 weeks gestation of pregnancy: Secondary | ICD-10-CM

## 2023-07-10 DIAGNOSIS — O113 Pre-existing hypertension with pre-eclampsia, third trimester: Secondary | ICD-10-CM

## 2023-07-10 DIAGNOSIS — Z23 Encounter for immunization: Secondary | ICD-10-CM

## 2023-07-10 DIAGNOSIS — O99213 Obesity complicating pregnancy, third trimester: Secondary | ICD-10-CM | POA: Diagnosis not present

## 2023-07-10 DIAGNOSIS — E079 Disorder of thyroid, unspecified: Secondary | ICD-10-CM

## 2023-07-10 DIAGNOSIS — O99283 Endocrine, nutritional and metabolic diseases complicating pregnancy, third trimester: Secondary | ICD-10-CM

## 2023-07-10 NOTE — Patient Instructions (Signed)

## 2023-07-10 NOTE — Progress Notes (Signed)
 PRENATAL VISIT NOTE  Subjective:  Tara Woodward is a 33 y.o. G2P1002 at [redacted]w[redacted]d being seen today for ongoing prenatal care.  Was admitted from 07/01/23 to 07/03/23 for BP control after she was diagnosed with [facility] and superimposed preeclampsia.  She is currently monitored for the following issues for this high-risk pregnancy and has HTN (hypertension); Major depression, recurrent (HCC); Class 3 severe obesity due to excess calories with body mass index (BMI) of 45.0 to 49.9 in adult; Acquired hypothyroidism; HLD (hyperlipidemia); Genital warts; Insomnia; Thrombocytosis; GERD (gastroesophageal reflux disease); Supervision of high risk pregnancy, antepartum; Chronic hypertension with superimposed preeclampsia; Nausea and vomiting in pregnancy; Thyroid  disease during pregnancy in third trimester; Rubella non-immune status, antepartum; and Maternal morbid obesity, antepartum (HCC) on their problem list.  Patient reports no complaints since discharge from hospital. Denies any headaches, visual symptoms, RUQ/epigastric pain or other concerning symptoms.   Contractions: Not present. Vag. Bleeding: None.  Movement: Present. Denies leaking of fluid.   The following portions of the patient's history were reviewed and updated as appropriate: allergies, current medications, past family history, past medical history, past social history, past surgical history and problem list.   Objective:   Vitals:   07/10/23 1426  BP: (!) 143/80  Pulse: 80  Weight: (!) 323 lb 6.4 oz (146.7 kg)    Fetal Status: Fetal Heart Rate (bpm): 144   Movement: Present     General:  Alert, oriented and cooperative. Patient is in no acute distress.  Skin: Skin is warm and dry. No rash noted.   Cardiovascular: Normal heart rate noted  Respiratory: Normal respiratory effort, no problems with respiration noted  Abdomen: Soft, gravid, appropriate for gestational age.  Pain/Pressure: Absent     Pelvic: Cervical exam deferred         Extremities: Normal range of motion.  Edema: Mild pitting, slight indentation  Mental Status: Normal mood and affect. Normal behavior. Normal judgment and thought content.   US  MFM OB DETAIL +14 WK Result Date: 06/26/2023 ----------------------------------------------------------------------  OBSTETRICS REPORT                       (Signed Final 06/26/2023 10:37 am) ---------------------------------------------------------------------- Patient Info  ID #:       [id number]                          D.O.B.:  12-14-1990 (32 yrs)(F)  Name:       Tara Woodward                   Visit Date: 06/25/2023 09:14 am ---------------------------------------------------------------------- Performed By  Attending:        Sal Crass MD         Ref. Address:     [address]                                                             [address]  Performed By:     Marybeth Smock         [facility]  Referred By:      [facility] ---------------------------------------------------------------------- Orders  #  Description                           Code        Ordered By  1  US  MFM OB DETAIL +14 WK               76811.01    Tiffany Foerster ----------------------------------------------------------------------  #  Order #                     Accession #                Episode #  1  [id number]                   [id number]                 [id number] ---------------------------------------------------------------------- Indications  Obesity complicating pregnancy, second         O99.212  trimester (BMI 45)  Hypertension - Chronic/Pre-existing            O10.019  [redacted] weeks gestation of pregnancy                Z3A.26  Low risk NIPS Neg AFP NEG Horizon  ---------------------------------------------------------------------- Vital Signs  BP:          175/102 ---------------------------------------------------------------------- Fetal Evaluation  Num Of Fetuses:         1  Fetal Heart Rate(bpm):  134  Cardiac Activity:       Observed  Presentation:           Breech  Placenta:               Fundal with an extra lobe anterior  P. Cord Insertion:      Visualized  Amniotic Fluid  AFI FV:      Within normal limits  AFI Sum(cm)     %Tile       Largest Pocket(cm)  10.82           16          4.72  RUQ(cm)       RLQ(cm)       LUQ(cm)        LLQ(cm)  4.72          2.86          3.24           0 ---------------------------------------------------------------------- Biometry  BPD:      69.8  mm     G. Age:  28w 0d         88  %    CI:  76.04   %    70 - 86                                                          FL/HC:      18.0   %    18.6 - 20.4  HC:      253.7  mm     G. Age:  27w 4d         63  %    HC/AC:      1.18        1.04 - 1.22  AC:      215.3  mm     G. Age:  26w 0d         29  %    FL/BPD:     65.3   %    71 - 87  FL:       45.6  mm     G. Age:  25w 1d          7  %    FL/AC:      21.2   %    20 - 24  HUM:      43.4  mm     G. Age:  25w 6d         34  %  CER:      33.3  mm     G. Age:  28w 2d         93  %  LV:        5.1  mm  Est. FW:     873  gm    1 lb 15 oz      21  % ---------------------------------------------------------------------- OB History  Gravidity:    2         Term:   2        Prem:   0        SAB:   0  TOP:          0       Ectopic:  0        Living: 2 ---------------------------------------------------------------------- Gestational Age  U/S Today:     26w 5d                                        EDD:   09/26/23  Best:          26w 3d     Det. By:  U/S  (06/06/23)          EDD:   09/28/23 ---------------------------------------------------------------------- Targeted Anatomy  Central Nervous System  Calvarium/Cranial V.:  Appears normal          Cereb./Vermis:          Appears normal  Cavum:                 Appears normal         Cisterna Magna:         Not well visualized  Lateral Ventricles:    Appears normal         Midline Falx:           Appears normal  Choroid  Plexus:        Appears normal  Spine  Cervical:              Limited                Sacral:                 Limited  Thoracic:              Limited                Shape/Curvature:        Appears normal  Lumbar:                Limited  Head/Neck  Lips:                  Appears normal         Profile:                Appears normal  Neck:                  Not well visualized    Orbits/Eyes:            Not well visualized  Nuchal Fold:           Not well visualized    Mandible:               Not well visualized  Nasal Bone:            Present                Maxilla:                Not well visualized  Thorax  4 Chamber View:        Appears normal         Interventr. Septum:     Appears normal  Cardiac Rhythm:        Normal                 Cardiac Axis:           Appears normal  Cardiac Situs:         Appears normal         Diaphragm:              Appears normal  Rt Outflow Tract:      Not well visualized    3 Vessel View:          Not well visualized  Lt Outflow Tract:      Appears normal         3 V Trachea View:       Not well visualized  Aortic Arch:           Not well visualized    IVC:                    Not well visualized  Ductal Arch:           Not well visualized    Crossing:               Appears normal  SVC:                   Not well visualized  Abdomen  Ventral Wall:          Appears normal         Lt Kidney:  Appears normal  Cord Insertion:        Appears normal         Rt Kidney:              Appears normal  Situs:                 Appears normal         Bladder:                Appears normal  Stomach:               Appears normal  Extremities  Lt Humerus:            Appears normal         Lt Femur:               Appears normal  Rt Humerus:            Not well visualized     Rt Femur:               Appears normal  Lt Forearm:            Appears normal         Lt Lower Leg:           Appears normal  Rt Forearm:            Not well visualized    Rt Lower Leg:           Not well visualized  Lt Hand:               Not well visualized    Lt Foot:                Not well visualized  Rt Hand:               Not well visualized    Rt Foot:                Not well visualized  Other  Umbilical Cord:        Not well visualized ---------------------------------------------------------------------- Cervix Uterus Adnexa  Cervix  Length:            4.3  cm.  Normal appearance by transabdominal scan  Uterus  No abnormality visualized.  Right Ovary  Not visualized.  Left Ovary  Not visualized.  Cul De Sac  No free fluid seen.  Adnexa  No abnormality visualized ---------------------------------------------------------------------- Comments  Tara Woodward is currently at 26 weeks and 3 days.  She was  seen due to maternal obesity with a BMI of 49, advanced  maternal age, hypothyroidism currently treated with  Synthroid , and chronic hypertension treated with Procardia   30 mg XL daily.  Her most recent TSH level was elevated  (10.7).  Her blood pressures today were 175/102 and 170/113.  She  denies any signs or symptoms of preeclampsia.  She had a cell free DNA test earlier in her pregnancy which  indicated a low risk for trisomy 25, 68, and 13. A female fetus is  predicted.  On today's exam, the overall EFW of 1 pound 15 ounces  measures at the 21st percentile for her gestational age.  There was normal amniotic fluid noted.  There were no obvious fetal anomalies noted on today's  ultrasound exam.  However, today's exam was limited due to  the fetal position and maternal body habitus.  A possible anterior succenturiate lobe of the placenta was  noted  on today's exam.  The patient was advised that a succenturiate lobe is a  separate accessory lobe of the placenta that may be  connected to the main lobe of the  placenta via a blood  vessel.  There were no signs of vasa previa noted in the  lower uterine segment today.  Care should be taken to ensure that the succenturiate lobe is  removed after delivery.  We will continue to assess the  placenta during her future exams.  The patient was informed that anomalies may be missed due  to technical limitations. If the fetus is in a suboptimal position  or maternal habitus is increased, visualization of the fetus in  the maternal uterus may be impaired.  The following were discussed during today's consultation:  Chronic hypertension in pregnancy  The implications and management of chronic hypertension in  pregnancy was discussed.  Due to her extremely elevated blood pressures, she was  advised that the dosage of her Procardia  will most likely need  to be increased.  The patient was hesitant to go to the MAU  for blood pressure monitoring today.  I advised her to take an extra 30 mg of Procardia  when she  gets home and to call me later this afternoon regarding what  her blood pressure is after she takes the additional 30 mg of  Procardia .  The patient called me at 4:30 PM and told me that her blood  pressure was 150/100 after taking an additional 30 mg of  Procardia  when she got home.  I advised her to take an extra 30 mg of Procardia  this  evening, making the total dosage of Procardia  90 mg daily.  I called the [facility] office and got her an appointment for  a blood pressure check and medication dosage adjustment  on June 26, 2023 (tomorrow afternoon).  She should be maintained on 90 mg of Procardia  XL daily.  Labetalol  may be added should her blood pressures continue  to be elevated despite the increased Procardia  dose.  The increased risk of superimposed preeclampsia, an  indicated preterm delivery, and possible fetal growth  restriction due to chronic hypertension in pregnancy was  discussed.  We will continue to follow her with monthly growth scans.  Weekly fetal testing  should be started at around 32 weeks.  To decrease her risk of superimposed preeclampsia, she was  advised to continue taking a daily baby aspirin  (81 mg daily)  for preeclampsia prophylaxis.  Preeclampsia precautions were reviewed.  Advanced maternal age in pregnancy  The increased risk of fetal aneuploidy due to advanced  maternal age was discussed.  Due to advanced maternal age, the patient was offered and  declined an amniocentesis today for definitive diagnosis of  fetal aneuploidy.  She is comfortable with the low risk  indicated by her cell free DNA test.  Hypothyroidism in pregnancy  The patient was advised to continue taking Synthroid  for the  duration of her pregnancy.  She should have her thyroid  function tests (TSH and free T4  levels) monitored on a monthly basis to determine if any  adjustments to her Synthroid  dose is needed.  The patient stated that all of her questions were answered  today.  A total of 60 minutes was spent counseling and coordinating  the care for this patient.  Greater than 50% of the time was  spent in direct face-to-face contact. ----------------------------------------------------------------------  Sal Crass, MD Electronically Signed Final Report   06/26/2023 10:37 am ----------------------------------------------------------------------      Latest Ref Rng & Units 07/01/2023    7:21 PM 06/26/2023    2:27 PM 06/06/2023   12:00 AM  CBC  WBC 4.0 - 10.5 K/uL 9.8  9.9  10.2   Hemoglobin 12.0 - 15.0 g/dL 16.1  09.6  04.5   Hematocrit 36.0 - 46.0 % 32.9  35.0  33.7   Platelets 150 - 400 K/uL 406  409  360       Latest Ref Rng & Units 07/01/2023    7:21 PM 06/26/2023    2:27 PM 06/06/2023   12:00 AM  CMP  Glucose 70 - 99 mg/dL 81  78  84   BUN 6 - 20 mg/dL 8  7  8    Creatinine 0.44 - 1.00 mg/dL 4.09  8.11  9.14   Sodium 135 - 145 mmol/L 137  138  134   Potassium 3.5 - 5.1 mmol/L 3.8  4.2  4.1   Chloride 98 - 111 mmol/L 106  102  98   CO2 22 - 32  mmol/L 21  22  20    Calcium  8.9 - 10.3 mg/dL 8.5  8.7  8.6   Total Protein 6.5 - 8.1 g/dL 6.0  6.0  6.4   Total Bilirubin 0.0 - 1.2 mg/dL 0.4  <7.8  <2.9   Alkaline Phos 38 - 126 U/L 103  149  115   AST 15 - 41 U/L 14  9  11    ALT 0 - 44 U/L 10  6  7     Assessment and Plan:  Pregnancy: G2P1002 at [redacted]w[redacted]d 1. Chronic hypertension with superimposed preeclampsia (Primary) Stable BP on Nifedipine  CC 60 mg po bid, Labetalol  200 mg po tid.  Continue home BP checks. Continue weekly lab checks. Severe preeclampsia precautions reviewed. Follow up scans and antenatal testing as per MFM recommendations. Continue close monitoring. - Comprehensive metabolic panel with GFR - CBC  2. Thyroid  disease during pregnancy in third trimester On Synthroid  250 mcg daily (dose increased on 07/02/23), will check free T3/T4 to have reference point.  On 4/27, TSH was 39.115, Total T3 186. Will recheck around 31-32 weeks - T3, free - T4, free  3. Maternal morbid obesity, antepartum (HCC) BMI 53.82.  Already scheduled for growth scans and antenatal testing.  4. Need for diphtheria-tetanus-pertussis (Tdap) vaccine - Tdap vaccine greater than or equal to 7yo IM given  5. [redacted] weeks gestation of pregnancy 6. Supervision of high risk pregnancy, antepartum Normal 1 hr GTT during her admission, NR HIV and RPR.   Desires BTS, discussed that this will not be done immediately postpartum due to technical difficulty, but can be done as interval.  Discussed vasectomy or progestin IUD (also has endometrial protection) as alternatives. If cesarean section performed, it can be done if desired.  Medicaid papers signed today.  Preterm labor symptoms and general obstetric precautions including but not limited to vaginal bleeding, contractions, leaking of fluid and fetal movement were reviewed in detail with the patient. Please refer to After Visit Summary for other counseling recommendations.   Return in about 1 week (around  07/17/2023) for OFFICE OB VISIT (MD only) and weekly labs due to preeclampsia.  Future Appointments  Date Time Provider Department Center  07/24/2023  8:00 AM [facility] PROVIDER 1 [facility]  07/24/2023  8:30 AM [facility] US3 [facility]  08/07/2023 11:00 AM [facility] PROVIDER 1 [facility]  08/07/2023 11:30 AM [facility] US3 [facility]  08/14/2023 11:00 AM [facility] PROVIDER 1 [facility]  08/14/2023 11:30 AM [facility] US2 [facility]  08/21/2023 10:00 AM [facility] PROVIDER 1 [facility]  08/21/2023 10:30 AM [facility] US2 [facility]    Lenoard Rad, MD

## 2023-07-11 ENCOUNTER — Encounter: Payer: Self-pay | Admitting: Obstetrics & Gynecology

## 2023-07-11 LAB — COMPREHENSIVE METABOLIC PANEL WITH GFR
ALT: 12 IU/L (ref 0–32)
AST: 13 IU/L (ref 0–40)
Albumin: 3.3 g/dL — ABNORMAL LOW (ref 3.9–4.9)
Alkaline Phosphatase: 173 IU/L — ABNORMAL HIGH (ref 44–121)
BUN/Creatinine Ratio: 18 (ref 9–23)
BUN: 8 mg/dL (ref 6–20)
Bilirubin Total: <0.2 mg/dL (ref 0.0–1.2)
CO2: 20 mmol/L (ref 20–29)
Calcium: 8.4 mg/dL — ABNORMAL LOW (ref 8.7–10.2)
Chloride: 100 mmol/L (ref 96–106)
Creatinine, Ser: 0.44 mg/dL — ABNORMAL LOW (ref 0.57–1.00)
Globulin, Total: 3 g/dL (ref 1.5–4.5)
Glucose: 80 mg/dL (ref 70–99)
Potassium: 4.5 mmol/L (ref 3.5–5.2)
Sodium: 134 mmol/L (ref 134–144)
Total Protein: 6.3 g/dL (ref 6.0–8.5)
eGFR: 130 mL/min/{1.73_m2} (ref >59)

## 2023-07-11 LAB — CBC
Hematocrit: 33.9 % — ABNORMAL LOW (ref 34.0–46.6)
Hemoglobin: 11.8 g/dL (ref 11.1–15.9)
MCH: 30.8 pg (ref 26.6–33.0)
MCHC: 34.8 g/dL (ref 31.5–35.7)
MCV: 89 fL (ref 79–97)
Platelets: 402 10*3/uL (ref 150–450)
RBC: 3.83 x10E6/uL (ref 3.77–5.28)
RDW: 13.5 % (ref 11.7–15.4)
WBC: 10.4 10*3/uL (ref 3.4–10.8)

## 2023-07-11 LAB — T4, FREE: Free T4: 0.73 ng/dL — ABNORMAL LOW (ref 0.82–1.77)

## 2023-07-11 LAB — T3, FREE: T3, Free: 1.8 pg/mL — ABNORMAL LOW (ref 2.0–4.4)

## 2023-07-16 DIAGNOSIS — Z419 Encounter for procedure for purposes other than remedying health state, unspecified: Secondary | ICD-10-CM | POA: Diagnosis not present

## 2023-07-17 ENCOUNTER — Encounter: Payer: Self-pay | Admitting: Obstetrics and Gynecology

## 2023-07-17 ENCOUNTER — Ambulatory Visit: Admitting: Obstetrics and Gynecology

## 2023-07-17 VITALS — BP 134/85 | HR 88 | Wt 319.0 lb

## 2023-07-17 DIAGNOSIS — O99210 Obesity complicating pregnancy, unspecified trimester: Secondary | ICD-10-CM | POA: Diagnosis not present

## 2023-07-17 DIAGNOSIS — O119 Pre-existing hypertension with pre-eclampsia, unspecified trimester: Secondary | ICD-10-CM | POA: Diagnosis not present

## 2023-07-17 DIAGNOSIS — Z6841 Body Mass Index (BMI) 40.0 and over, adult: Secondary | ICD-10-CM | POA: Diagnosis not present

## 2023-07-17 DIAGNOSIS — E079 Disorder of thyroid, unspecified: Secondary | ICD-10-CM | POA: Diagnosis not present

## 2023-07-17 DIAGNOSIS — Z3A29 29 weeks gestation of pregnancy: Secondary | ICD-10-CM

## 2023-07-17 DIAGNOSIS — O99283 Endocrine, nutritional and metabolic diseases complicating pregnancy, third trimester: Secondary | ICD-10-CM | POA: Diagnosis not present

## 2023-07-17 DIAGNOSIS — O43193 Other malformation of placenta, third trimester: Secondary | ICD-10-CM | POA: Diagnosis not present

## 2023-07-17 NOTE — Progress Notes (Unsigned)
 PRENATAL VISIT NOTE  Subjective:  Tara Woodward is a 33 y.o. G2P1002 at [redacted]w[redacted]d being seen today for ongoing prenatal care.  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  Patient reports no complaints.  Contractions: Not present. Vag. Bleeding: None.  Movement: Present. Denies leaking of fluid.   The following portions of the patient's history were reviewed and updated as appropriate: allergies, current medications, past family history, past medical history, past social history, past surgical history and problem list.   Objective:   Vitals:   07/17/23 0906  BP: 134/85  Pulse: 88  Weight: (!) 319 lb (144.7 kg)     Fetal Status: Fetal Heart Rate (bpm): 132   Movement: Present      General: Alert, oriented and cooperative. Patient is in no acute distress.  Skin: Skin is warm and dry. No rash noted.   Cardiovascular: Normal heart rate noted  Respiratory: Normal respiratory effort, no problems with respiration noted  Abdomen: Soft, gravid, appropriate for gestational age.  Pain/Pressure: Absent     Pelvic: Cervical exam deferred        Extremities: Normal range of motion.  Edema: Very deep pitting, indentation lasts a long time  Mental Status: Normal mood and affect. Normal behavior. Normal judgment and thought content.   Assessment and Plan:  Pregnancy: G2P1002 at [redacted]w[redacted]d 1. Placenta succenturiata in  third trimester (Primary) Fundal placenta with extra anterior lobe.   2. Maternal morbid obesity, antepartum (HCC) Weight stable  3. BMI 50.0-59.9, adult (HCC)  4. Chronic hypertension with superimposed preeclampsia Patient confirms on ASA 162 and labetalol  200 q8h and procardia  60 bid. BPs good today. Pre-eclampsia precautions given. Weekly labs today and has MFM appointment in a week. D/w her re: plan of care, s/s to be on the look out for.  Patient working at [employer] and they are giving her accommodations and she doesn't feel it's too much. I told her if it feels to be to let us  know and we can do disability paperwork.  4/21: efw 21%, 873g, ac 29%, afi wnl  - CBC - Comprehensive metabolic panel with GFR  5. Chronic hypertension with superimposed pre-eclampsia - CBC - Comprehensive metabolic panel with GFR  6. [redacted] weeks gestation of pregnancy Normal 1h GTT while inpatient - CBC - Comprehensive metabolic panel with GFR  7. Thyroid  disease during pregnancy in third trimester On synthroid  250 qday. Repeat TSH around 31-32wks  Preterm labor symptoms and general obstetric precautions including but not limited to vaginal bleeding, contractions, leaking of fluid and fetal movement were reviewed in detail with the patient. Please refer to After Visit Summary for other counseling recommendations.   No follow-ups on file.  Future Appointments  Date Time Provider Department Center  07/24/2023  8:00 AM [facility] PROVIDER 1 [facility]  07/24/2023  8:30 AM [facility] US3 [facility]  07/24/2023 10:35 AM Anyanwu, Kathrine Paris, MD [facility]  07/31/2023  9:35 AM Anyanwu, Kathrine Paris, MD [facility]  08/07/2023  8:35 AM Constant, Carles Cheadle, MD [facility]  08/07/2023  11:00 AM [facility] PROVIDER 1 [facility]  08/07/2023 11:30 AM [facility] US3 [facility]  08/14/2023  9:35 AM Granville Layer, MD [facility]  08/14/2023 11:00 AM [facility] PROVIDER 1 [facility]  08/14/2023 11:30 AM  [facility] US2 [facility]  08/21/2023  8:35 AM Izell Marsh, MD [facility]  08/21/2023 10:00 AM [facility] PROVIDER 1 [facility]  08/21/2023 10:30 AM [facility] US2 [facility]  08/28/2023 10:55 AM Granville Layer, MD [facility]  09/04/2023  9:35 AM Thurmon [place], Kathrine Paris, MD [facility]  09/11/2023 10:55 AM Izell Marsh, MD [facility]  09/18/2023 10:55 AM Anyanwu, Kathrine Paris, MD [facility]    Raynell Caller, MD

## 2023-07-17 NOTE — Progress Notes (Unsigned)
 ROB   CC: None

## 2023-07-18 LAB — COMPREHENSIVE METABOLIC PANEL WITH GFR
ALT: 7 IU/L (ref 0–32)
AST: 10 IU/L (ref 0–40)
Albumin: 3.2 g/dL — ABNORMAL LOW (ref 3.9–4.9)
Alkaline Phosphatase: 183 IU/L — ABNORMAL HIGH (ref 44–121)
BUN/Creatinine Ratio: 13 (ref 9–23)
BUN: 6 mg/dL (ref 6–20)
Bilirubin Total: <0.2 mg/dL (ref 0.0–1.2)
CO2: 20 mmol/L (ref 20–29)
Calcium: 8.7 mg/dL (ref 8.7–10.2)
Chloride: 102 mmol/L (ref 96–106)
Creatinine, Ser: 0.45 mg/dL — ABNORMAL LOW (ref 0.57–1.00)
Globulin, Total: 2.6 g/dL (ref 1.5–4.5)
Glucose: 69 mg/dL — ABNORMAL LOW (ref 70–99)
Potassium: 5 mmol/L (ref 3.5–5.2)
Sodium: 138 mmol/L (ref 134–144)
Total Protein: 5.8 g/dL — ABNORMAL LOW (ref 6.0–8.5)
eGFR: 129 mL/min/{1.73_m2} (ref >59)

## 2023-07-18 LAB — CBC
Hematocrit: 34.6 % (ref 34.0–46.6)
Hemoglobin: 11.5 g/dL (ref 11.1–15.9)
MCH: 30.3 pg (ref 26.6–33.0)
MCHC: 33.2 g/dL (ref 31.5–35.7)
MCV: 91 fL (ref 79–97)
Platelets: 401 10*3/uL (ref 150–450)
RBC: 3.8 x10E6/uL (ref 3.77–5.28)
RDW: 13.6 % (ref 11.7–15.4)
WBC: 9.7 10*3/uL (ref 3.4–10.8)

## 2023-07-24 ENCOUNTER — Ambulatory Visit (HOSPITAL_BASED_OUTPATIENT_CLINIC_OR_DEPARTMENT_OTHER)

## 2023-07-24 ENCOUNTER — Encounter: Admitting: Obstetrics & Gynecology

## 2023-07-24 ENCOUNTER — Other Ambulatory Visit: Payer: Self-pay | Admitting: Obstetrics

## 2023-07-24 ENCOUNTER — Ambulatory Visit: Payer: Self-pay | Admitting: Obstetrics and Gynecology

## 2023-07-24 ENCOUNTER — Other Ambulatory Visit: Payer: Self-pay

## 2023-07-24 ENCOUNTER — Ambulatory Visit: Admitting: Cardiology

## 2023-07-24 ENCOUNTER — Inpatient Hospital Stay (HOSPITAL_COMMUNITY)
Admission: AD | Admit: 2023-07-24 | Discharge: 2023-07-29 | DRG: 784 | Disposition: A | Discharge status: Home / Self Care | Attending: Obstetrics and Gynecology | Admitting: Obstetrics and Gynecology

## 2023-07-24 ENCOUNTER — Encounter (HOSPITAL_COMMUNITY): Payer: Self-pay | Admitting: Obstetrics and Gynecology

## 2023-07-24 ENCOUNTER — Ambulatory Visit (HOSPITAL_BASED_OUTPATIENT_CLINIC_OR_DEPARTMENT_OTHER): Admitting: Obstetrics and Gynecology

## 2023-07-24 VITALS — BP 162/95 | HR 78

## 2023-07-24 VITALS — BP 154/106

## 2023-07-24 DIAGNOSIS — O09899 Supervision of other high risk pregnancies, unspecified trimester: Secondary | ICD-10-CM

## 2023-07-24 DIAGNOSIS — E669 Obesity, unspecified: Secondary | ICD-10-CM | POA: Diagnosis not present

## 2023-07-24 DIAGNOSIS — O99283 Endocrine, nutritional and metabolic diseases complicating pregnancy, third trimester: Secondary | ICD-10-CM | POA: Insufficient documentation

## 2023-07-24 DIAGNOSIS — O9962 Diseases of the digestive system complicating childbirth: Secondary | ICD-10-CM | POA: Diagnosis not present

## 2023-07-24 DIAGNOSIS — Z3490 Encounter for supervision of normal pregnancy, unspecified, unspecified trimester: Secondary | ICD-10-CM

## 2023-07-24 DIAGNOSIS — O365930 Maternal care for other known or suspected poor fetal growth, third trimester, not applicable or unspecified: Secondary | ICD-10-CM | POA: Diagnosis present

## 2023-07-24 DIAGNOSIS — O99213 Obesity complicating pregnancy, third trimester: Secondary | ICD-10-CM

## 2023-07-24 DIAGNOSIS — Z3A30 30 weeks gestation of pregnancy: Secondary | ICD-10-CM | POA: Diagnosis not present

## 2023-07-24 DIAGNOSIS — E66813 Obesity, class 3: Secondary | ICD-10-CM | POA: Diagnosis not present

## 2023-07-24 DIAGNOSIS — D62 Acute posthemorrhagic anemia: Secondary | ICD-10-CM | POA: Diagnosis not present

## 2023-07-24 DIAGNOSIS — Z833 Family history of diabetes mellitus: Secondary | ICD-10-CM

## 2023-07-24 DIAGNOSIS — O99214 Obesity complicating childbirth: Secondary | ICD-10-CM | POA: Diagnosis not present

## 2023-07-24 DIAGNOSIS — O119 Pre-existing hypertension with pre-eclampsia, unspecified trimester: Secondary | ICD-10-CM

## 2023-07-24 DIAGNOSIS — K219 Gastro-esophageal reflux disease without esophagitis: Secondary | ICD-10-CM | POA: Diagnosis present

## 2023-07-24 DIAGNOSIS — O10913 Unspecified pre-existing hypertension complicating pregnancy, third trimester: Secondary | ICD-10-CM | POA: Diagnosis not present

## 2023-07-24 DIAGNOSIS — Z3A31 31 weeks gestation of pregnancy: Secondary | ICD-10-CM | POA: Diagnosis not present

## 2023-07-24 DIAGNOSIS — Z79890 Hormone replacement therapy (postmenopausal): Secondary | ICD-10-CM | POA: Diagnosis not present

## 2023-07-24 DIAGNOSIS — Z7982 Long term (current) use of aspirin: Secondary | ICD-10-CM

## 2023-07-24 DIAGNOSIS — O1092 Unspecified pre-existing hypertension complicating childbirth: Secondary | ICD-10-CM | POA: Diagnosis not present

## 2023-07-24 DIAGNOSIS — O114 Pre-existing hypertension with pre-eclampsia, complicating childbirth: Principal | ICD-10-CM | POA: Diagnosis present

## 2023-07-24 DIAGNOSIS — O99284 Endocrine, nutritional and metabolic diseases complicating childbirth: Secondary | ICD-10-CM | POA: Diagnosis not present

## 2023-07-24 DIAGNOSIS — Z23 Encounter for immunization: Secondary | ICD-10-CM | POA: Diagnosis not present

## 2023-07-24 DIAGNOSIS — O10013 Pre-existing essential hypertension complicating pregnancy, third trimester: Secondary | ICD-10-CM | POA: Insufficient documentation

## 2023-07-24 DIAGNOSIS — Z302 Encounter for sterilization: Secondary | ICD-10-CM

## 2023-07-24 DIAGNOSIS — Z8249 Family history of ischemic heart disease and other diseases of the circulatory system: Secondary | ICD-10-CM | POA: Diagnosis not present

## 2023-07-24 DIAGNOSIS — O34219 Maternal care for unspecified type scar from previous cesarean delivery: Secondary | ICD-10-CM | POA: Diagnosis not present

## 2023-07-24 DIAGNOSIS — E039 Hypothyroidism, unspecified: Secondary | ICD-10-CM | POA: Diagnosis present

## 2023-07-24 DIAGNOSIS — O321XX0 Maternal care for breech presentation, not applicable or unspecified: Secondary | ICD-10-CM | POA: Diagnosis present

## 2023-07-24 DIAGNOSIS — O1414 Severe pre-eclampsia complicating childbirth: Secondary | ICD-10-CM | POA: Diagnosis not present

## 2023-07-24 DIAGNOSIS — Z2839 Other underimmunization status: Secondary | ICD-10-CM

## 2023-07-24 DIAGNOSIS — E079 Disorder of thyroid, unspecified: Secondary | ICD-10-CM | POA: Diagnosis not present

## 2023-07-24 DIAGNOSIS — E6689 Other obesity not elsewhere classified: Secondary | ICD-10-CM | POA: Diagnosis not present

## 2023-07-24 LAB — COMPREHENSIVE METABOLIC PANEL WITH GFR
ALT: 11 U/L (ref 0–44)
AST: 15 U/L (ref 15–41)
Albumin: 2.3 g/dL — ABNORMAL LOW (ref 3.5–5.0)
Alkaline Phosphatase: 159 U/L — ABNORMAL HIGH (ref 38–126)
Anion gap: 11 (ref 5–15)
BUN: 6 mg/dL (ref 6–20)
CO2: 23 mmol/L (ref 22–32)
Calcium: 8.7 mg/dL — ABNORMAL LOW (ref 8.9–10.3)
Chloride: 101 mmol/L (ref 98–111)
Creatinine, Ser: 0.5 mg/dL (ref 0.44–1.00)
GFR, Estimated: >60 mL/min (ref >60)
Glucose, Bld: 74 mg/dL (ref 70–99)
Potassium: 3.8 mmol/L (ref 3.5–5.1)
Sodium: 135 mmol/L (ref 135–145)
Total Bilirubin: 0.5 mg/dL (ref 0.0–1.2)
Total Protein: 6.4 g/dL — ABNORMAL LOW (ref 6.5–8.1)

## 2023-07-24 LAB — PROTEIN / CREATININE RATIO, URINE
Creatinine, Urine: 29 mg/dL
Protein Creatinine Ratio: 5.31 mg/mg{creat} — ABNORMAL HIGH (ref 0.00–0.15)
Total Protein, Urine: 154 mg/dL

## 2023-07-24 LAB — CBC
HCT: 36.3 % (ref 36.0–46.0)
Hemoglobin: 12.3 g/dL (ref 12.0–15.0)
MCH: 29.8 pg (ref 26.0–34.0)
MCHC: 33.9 g/dL (ref 30.0–36.0)
MCV: 87.9 fL (ref 80.0–100.0)
Platelets: 412 10*3/uL — ABNORMAL HIGH (ref 150–400)
RBC: 4.13 MIL/uL (ref 3.87–5.11)
RDW: 13 % (ref 11.5–15.5)
WBC: 8.5 10*3/uL (ref 4.0–10.5)
nRBC: 0 % (ref 0.0–0.2)

## 2023-07-24 LAB — TYPE AND SCREEN
ABO/RH(D): A POS
Antibody Screen: NEGATIVE

## 2023-07-24 LAB — TSH: TSH: 27.948 u[IU]/mL — ABNORMAL HIGH (ref 0.350–4.500)

## 2023-07-24 LAB — T4, FREE: Free T4: 0.57 ng/dL — ABNORMAL LOW (ref 0.61–1.12)

## 2023-07-24 MED ORDER — PRENATAL MULTIVITAMIN CH
1.0000 | ORAL_TABLET | Freq: Every day | ORAL | Status: DC
  Administered 2023-07-24 – 2023-07-26 (×3): 1 via ORAL
  Filled 2023-07-24 (×3): qty 1

## 2023-07-24 MED ORDER — LABETALOL HCL 200 MG PO TABS
200.0000 mg | ORAL_TABLET | Freq: Three times a day (TID) | ORAL | Status: DC
  Administered 2023-07-24 – 2023-07-28 (×12): 200 mg via ORAL
  Filled 2023-07-24 (×14): qty 1

## 2023-07-24 MED ORDER — SODIUM CHLORIDE 0.9% FLUSH: 3.0000 mL | INTRAVENOUS | Status: DC | PRN

## 2023-07-24 MED ORDER — PRENATAL 27-0.8 MG PO TABS: 1.0000 | ORAL_TABLET | Freq: Every day | ORAL | Status: DC

## 2023-07-24 MED ORDER — NIFEDIPINE ER OSMOTIC RELEASE 60 MG PO TB24
60.0000 mg | ORAL_TABLET | Freq: Two times a day (BID) | ORAL | Status: DC
  Administered 2023-07-24 – 2023-07-29 (×9): 60 mg via ORAL
  Filled 2023-07-24 (×11): qty 1

## 2023-07-24 MED ORDER — CALCIUM CARBONATE ANTACID 500 MG PO CHEW: 2.0000 | CHEWABLE_TABLET | ORAL | Status: DC | PRN

## 2023-07-24 MED ORDER — HEPARIN SODIUM (PORCINE) 10000 UNIT/ML IJ SOLN
10000.0000 [IU] | Freq: Two times a day (BID) | INTRAMUSCULAR | Status: DC
  Administered 2023-07-25 – 2023-07-26 (×4): 10000 [IU] via SUBCUTANEOUS
  Filled 2023-07-24 (×6): qty 1

## 2023-07-24 MED ORDER — BETAMETHASONE SOD PHOS & ACET 6 (3-3) MG/ML IJ SUSP
12.0000 mg | INTRAMUSCULAR | Status: AC
  Administered 2023-07-24 – 2023-07-25 (×2): 12 mg via INTRAMUSCULAR
  Filled 2023-07-24: qty 5

## 2023-07-24 MED ORDER — LEVOTHYROXINE SODIUM 125 MCG PO TABS
250.0000 ug | ORAL_TABLET | Freq: Every day | ORAL | Status: DC
  Administered 2023-07-25 – 2023-07-29 (×5): 250 ug via ORAL
  Filled 2023-07-24 (×2): qty 2
  Filled 2023-07-24: qty 10
  Filled 2023-07-24 (×4): qty 2

## 2023-07-24 MED ORDER — ACETAMINOPHEN 325 MG PO TABS: 650.0000 mg | ORAL_TABLET | ORAL | Status: DC | PRN

## 2023-07-24 MED ORDER — ASPIRIN 81 MG PO TBEC
162.0000 mg | DELAYED_RELEASE_TABLET | Freq: Every day | ORAL | Status: DC
  Administered 2023-07-25 – 2023-07-27 (×3): 162 mg via ORAL
  Filled 2023-07-24 (×4): qty 2

## 2023-07-24 MED ORDER — DOCUSATE SODIUM 100 MG PO CAPS
100.0000 mg | ORAL_CAPSULE | Freq: Every day | ORAL | Status: DC
  Administered 2023-07-24 – 2023-07-29 (×6): 100 mg via ORAL
  Filled 2023-07-24 (×6): qty 1

## 2023-07-24 MED ORDER — SODIUM CHLORIDE 0.9% FLUSH
3.0000 mL | Freq: Two times a day (BID) | INTRAVENOUS | Status: DC
  Administered 2023-07-24 – 2023-07-28 (×9): 10 mL via INTRAVENOUS

## 2023-07-24 MED ORDER — SODIUM CHLORIDE 0.9% FLUSH: 10.0000 mL | INTRAVENOUS | Status: DC | PRN

## 2023-07-24 MED ORDER — ENOXAPARIN SODIUM 80 MG/0.8ML IJ SOSY
70.0000 mg | PREFILLED_SYRINGE | INTRAMUSCULAR | Status: DC
  Administered 2023-07-24: 70 mg via SUBCUTANEOUS
  Filled 2023-07-24: qty 0.8

## 2023-07-24 NOTE — H&P (Signed)
 FACULTY PRACTICE ANTEPARTUM ADMISSION HISTORY AND PHYSICAL NOTE   History of Present Illness: Tara Woodward is a 33 y.o. G2P1002 at [redacted]w[redacted]d admitted for chronic hypertension, maternal obesity and severe IUGR .  Dopplers today showed intermittent Reversed end diastolic flow Patient reports the fetal movement as active. Patient reports uterine contraction  activity as none. Patient reports  vaginal bleeding as none. Patient describes fluid per vagina as None. Fetal presentation is breech.  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 07/17/2023   Maternal morbid obesity, antepartum (HCC) 06/26/2023   Rubella non-immune status, antepartum 06/08/2023   Supervision of high risk pregnancy, antepartum 06/06/2023   Chronic hypertension with superimposed preeclampsia 06/06/2023   Thyroid  disease during pregnancy in third trimester 06/06/2023   Insomnia 10/04/2021   GERD (gastroesophageal reflux disease) 10/04/2021   HLD (hyperlipidemia) 04/20/2021   Class 3 severe obesity due to excess calories with body mass index (BMI) of 45.0 to 49.9 in adult 03/15/2021   Acquired hypothyroidism 03/15/2021   Major depression, recurrent (HCC) 11/08/2016   HTN (hypertension) 01/24/2013    Past Medical History:  Diagnosis Date   Depression    Genital warts 06/15/2021   HTN (hypertension)    Hypothyroidism    Morbid obesity with BMI of 45.0-49.9, adult (HCC)     Past Surgical History:  Procedure Laterality Date   NO PAST SURGERIES      OB History  Gravida Para Term Preterm AB Living  2 1 1  0 0 2  SAB IAB Ectopic Multiple Live Births  0 0 0 1 2    # Outcome Date GA Lbr Len/2nd Weight Sex Type Anes PTL Lv  2 Current           1A Term 02/22/08 [redacted]w[redacted]d   M Vag-Spont   LIV  1B Term 02/22/08    F Vag-Spont   LIV    Obstetric Comments  Vag delivery x 2 (twins)    Social  History   Socioeconomic History   Marital status: Married    Spouse name: Josefina Nian   Number of children: 2   Years of education: Not on file   Highest education level: High school graduate  Occupational History   Occupation: Statistician  Tobacco Use   Smoking status: Never   Smokeless tobacco: Never  Vaping Use   Vaping status: Never Used  Substance and Sexual Activity   Alcohol use: Not Currently   Drug use: No   Sexual activity: Yes    Partners: Male    Birth control/protection: Pill  Other Topics Concern   Not on file  Social History Narrative   Not on file   Social Drivers of Health   Financial Resource Strain: Not on file  Food Insecurity: No Food Insecurity (07/24/2023)   Hunger Vital Sign    Worried About Running Out of Food in the Last Year: Never true    Ran Out of Food in the Last Year: Never true  Transportation Needs: No Transportation Needs (07/24/2023)   PRAPARE - Administrator, Civil Service (Medical): No    Lack of Transportation (Non-Medical): No  Physical Activity: Not on file  Stress: Not on file  Social Connections: Not on file    Family History  Problem Relation Age of Onset   Drug abuse Mother    Depression Mother    Bipolar disorder Mother  Alcohol abuse Father    Drug abuse Father    Bipolar disorder Father    Healthy Sister    Hypertension Maternal Grandmother    Diabetes Maternal Grandfather    Lung cancer Maternal Grandfather    Diabetes Maternal Aunt    Breast cancer Maternal Aunt    Hypertension Maternal Aunt    Colon cancer Neg Hx     No Known Allergies  Medications Prior to Admission  Medication Sig Dispense Refill Last Dose/Taking   aspirin  EC 81 MG tablet Take 2 tablets (162 mg total) by mouth daily. 180 tablet 3    labetalol  (NORMODYNE ) 200 MG tablet Take 1 tablet (200 mg total) by mouth every 8 (eight) hours. 90 tablet 2    levothyroxine  (SYNTHROID ) 125 MCG tablet Take 2 tablets (250 mcg total) by mouth daily at  6 (six) AM. 60 tablet 1    NIFEdipine  (ADALAT  CC) 60 MG 24 hr tablet Take 1 tablet (60 mg total) by mouth 2 (two) times daily. 60 tablet 4    Prenatal Vit-Fe Fumarate-FA (PRENATAL PO) Take by mouth.       Review of Systems - History obtained from chart review and the patient  Vitals:  BP (!) 147/93 (BP Location: Left Arm)   Pulse 76   Temp 97.8 F (36.6 C) (Oral)   Resp 18   Ht 5\' 5"  (1.651 m)   Wt (!) 143.6 kg   LMP  (LMP Unknown)   SpO2 100%   BMI 52.68 kg/m  Physical Examination: CONSTITUTIONAL: obese, Well-developed, well-nourished female in no acute distress.  HENT:  Normocephalic, atraumatic, External right and left ear normal. Oropharynx is clear and moist EYES: Conjunctivae and EOM are normal.  NECK: Normal range of motion, supple, no masses SKIN: Skin is warm and dry. No rash noted. Not diaphoretic. No erythema. No pallor. NEUROLGIC: Alert and oriented to person, place, and time. Normal reflexes, muscle tone coordination. No cranial nerve deficit noted. PSYCHIATRIC: Normal mood and affect. Normal behavior. Normal judgment and thought content. CARDIOVASCULAR: Normal heart rate noted, regular rhythm RESPIRATORY: Effort and breath sounds normal, no problems with respiration noted ABDOMEN: Soft, nontender, nondistended, gravid. MUSCULOSKELETAL: Normal range of motion. No edema and no tenderness. 2+ distal pulses.  Cervix: deferred Membranes:intact Fetal Monitoring:Baseline: 130s bpm, Variability: moderate, Accelerations: present, and Decelerations: Absent Tocometer: none  Labs:  Results for orders placed or performed during the hospital encounter of 07/24/23 (from the past 24 hours)  Protein / creatinine ratio, urine   Collection Time: 07/24/23 11:22 AM  Result Value Ref Range   Creatinine, Urine 29 mg/dL   Total Protein, Urine 154 mg/dL   Protein Creatinine Ratio 5.31 (H) 0.00 - 0.15 mg/mg[Cre]  Comprehensive metabolic panel   Collection Time: 07/24/23 12:25 PM   Result Value Ref Range   Sodium 135 135 - 145 mmol/L   Potassium 3.8 3.5 - 5.1 mmol/L   Chloride 101 98 - 111 mmol/L   CO2 23 22 - 32 mmol/L   Glucose, Bld 74 70 - 99 mg/dL   BUN 6 6 - 20 mg/dL   Creatinine, Ser 7.56 0.44 - 1.00 mg/dL   Calcium  8.7 (L) 8.9 - 10.3 mg/dL   Total Protein 6.4 (L) 6.5 - 8.1 g/dL   Albumin 2.3 (L) 3.5 - 5.0 g/dL   AST 15 15 - 41 U/L   ALT 11 0 - 44 U/L   Alkaline Phosphatase 159 (H) 38 - 126 U/L   Total Bilirubin 0.5 0.0 -  1.2 mg/dL   GFR, Estimated >16 >10 mL/min   Anion gap 11 5 - 15  TSH   Collection Time: 07/24/23 12:25 PM  Result Value Ref Range   TSH 27.948 (H) 0.350 - 4.500 uIU/mL  T4, free   Collection Time: 07/24/23 12:25 PM  Result Value Ref Range   Free T4 0.57 (L) 0.61 - 1.12 ng/dL  CBC   Collection Time: 07/24/23 12:25 PM  Result Value Ref Range   WBC 8.5 4.0 - 10.5 K/uL   RBC 4.13 3.87 - 5.11 MIL/uL   Hemoglobin 12.3 12.0 - 15.0 g/dL   HCT 96.0 45.4 - 09.8 %   MCV 87.9 80.0 - 100.0 fL   MCH 29.8 26.0 - 34.0 pg   MCHC 33.9 30.0 - 36.0 g/dL   RDW 11.9 14.7 - 82.9 %   Platelets 412 (H) 150 - 400 K/uL   nRBC 0.0 0.0 - 0.2 %  Type and screen [facility]   Collection Time: 07/24/23 12:28 PM  Result Value Ref Range   ABO/RH(D) A POS    Antibody Screen NEG    Sample Expiration      07/27/2023,2359 Performed at [facility], [address], [place], [place] [address]     Imaging Studies: US  MFM OB FOLLOW UP Result Date: 07/24/2023 ----------------------------------------------------------------------  OBSTETRICS REPORT                       (Signed Final 07/24/2023 10:39 am) ---------------------------------------------------------------------- Patient Info  ID #:       [id number]                          D.O.B.:  September 08, 1990 (32 yrs)(F)  Name:       Tara Woodward                   Visit Date: 07/24/2023 08:12 am ---------------------------------------------------------------------- Performed By  Attending:         Cassandria Clever MD        Ref. Address:     [address]                                                             [address]  Performed By:     Earnest Goad            [facility]  Referred By:      [facility] Roda Cirri ---------------------------------------------------------------------- Orders  #  Description                           Code        Ordered By  1  US  MFM OB FOLLOW UP                   [id number]    YU FANG  2  US  MFM UA CORD DOPPLER                76820.02    YU FANG  3  US  MFM FETAL BPP WO NON               76819.01    YU FANG     STRESS ----------------------------------------------------------------------  #  Order #                     Accession #                Episode #  1  [id number]                   [id number]                 [id number]  2  [id number]                   [id number]                 [id number]  3  [id number]                   [id number]                 [id number] ---------------------------------------------------------------------- Indications  Maternal care for known or suspected poor      O36.5930  fetal growth, third trimester, single or  unspecified fetus IUGR  Obesity complicating pregnancy, third          O99.213  trimester (pregravid BMI 45)  Pre-existing essential hypertension            O10.013  complicating pregnancy, third trimester  (Labetalol  and procardia )  [redacted] weeks gestation of pregnancy                Z3A.30  Low risk NIPS Neg AFP NEG Horizon ---------------------------------------------------------------------- Fetal Evaluation  Num Of Fetuses:         1  Fetal Heart Rate(bpm):  124  Cardiac Activity:       Observed  Presentation:           Breech  Placenta:               Right lateral  P. Cord Insertion:      Previously seen  Amniotic  Fluid  AFI FV:      Within normal limits  AFI Sum(cm)     %Tile       Largest Pocket(cm)  10.91           21          3.25  RUQ(cm)       RLQ(cm)       LUQ(cm)        LLQ(cm)  2.78          2.09          2.79  3.25 ---------------------------------------------------------------------- Biophysical Evaluation  Amniotic F.V:   Within normal limits       F. Tone:        Observed  F. Movement:    Observed                   Score:          8/8  F. Breathing:   Observed ---------------------------------------------------------------------- Biometry  BPD:      74.3  mm     G. Age:  29w 6d         17  %    CI:        80.59   %    70 - 86                                                          FL/HC:      18.8   %    19.3 - 21.3  HC:      261.4  mm     G. Age:  28w 3d        < 1  %    HC/AC:      1.12        0.96 - 1.17  AC:      234.2  mm     G. Age:  27w 5d        < 1  %    FL/BPD:     66.1   %    71 - 87  FL:       49.1  mm     G. Age:  26w 4d        < 1  %    FL/AC:      21.0   %    20 - 24  HUM:      47.6  mm     G. Age:  28w 0d        < 5  %  CER:      39.6  mm     G. Age:  32w 1d         75  %  LV:        2.3  mm  CM:        7.3  mm  Est. FW:    1088  gm      2 lb 6 oz    < 1  % ---------------------------------------------------------------------- OB History  Gravidity:    2         Term:   2        Prem:   0        SAB:   0  TOP:          0       Ectopic:  0        Living: 2 ---------------------------------------------------------------------- Gestational Age  U/S Today:     28w 1d                                        EDD:   10/15/23  Best:          30w 4d     Det. By:  U/S  (06/06/23)          EDD:   09/28/23 ---------------------------------------------------------------------- Targeted Anatomy  Central Nervous System  Calvarium/Cranial V.:  Appears normal         Cereb./Vermis:          Appears normal  Cavum:                 Appears normal         Cisterna Magna:         Appears normal  Lateral  Ventricles:    Appears normal         Midline Falx:           Previously seen  Choroid Plexus:        Previously seen  Spine  Cervical:              Ltd previously         Sacral:                 Ltd previously  Thoracic:              Ltd previously         Shape/Curvature:        Previously seen  Lumbar:                Ltd previously  Head/Neck  Lips:                  Previously seen        Profile:                Previously seen  Neck:                  Not well visualized    Orbits/Eyes:            Appears normal  Nuchal Fold:           Not well visualized    Mandible:               Appears normal  Nasal Bone:            Previously seen        Maxilla:                Appears normal  Thorax  4 Chamber View:        Previously seen        Interventr. Septum:     Appears normal  Cardiac Rhythm:        Normal                 Cardiac Axis:           Appears normal  Cardiac Situs:         Previously seen        Diaphragm:              Appears normal  Rt Outflow Tract:      Not well visualized    3 Vessel View:          Not well visualized  Lt Outflow Tract:      Previously seen        3 V Trachea View:       Not well visualized  Aortic Arch:           Not well visualized    IVC:  Not well visualized  Ductal Arch:           Not well visualized    Crossing:               Previously seen  SVC:                   Not well visualized  Abdomen  Ventral Wall:          Previously seen        Lt Kidney:              Appears normal  Cord Insertion:        Previously seen        Rt Kidney:              Appears normal  Situs:                 Previously seen        Bladder:                Appears normal  Stomach:               Appears normal  Extremities  Lt Humerus:            Previously seen        Lt Femur:               Previously seen  Rt Humerus:            Not well visualized    Rt Femur:               Previously seen  Lt Forearm:            Previously seen        Lt Lower Leg:           Previously seen  Rt  Forearm:            Not well visualized    Rt Lower Leg:           Not well visualized  Lt Hand:               Not well visualized    Lt Foot:                Not well visualized  Rt Hand:               Not well visualized    Rt Foot:                Not well visualized  Other  Umbilical Cord:        Not well visualized  Comment:     Technically difficult due to maternal habitus. ---------------------------------------------------------------------- Doppler - Fetal Vessels  Umbilical Artery                                                              ADFV    RDFV  Yes     Yes  Comment:    Absent with intermittent reversed ---------------------------------------------------------------------- Cervix Uterus Adnexa  Cervix  Not visualized (advanced GA >24wks)  Uterus  No abnormality visualized.  Right Ovary  Size(cm)     3.06   x   2.53   x  1.59      Vol(ml): 6.45  Within normal limits.  Left Ovary  Not visualized.  Cul De Sac  No free fluid seen.  Adnexa  No adnexal mass visualized ---------------------------------------------------------------------- Impression  G2 P1002 at 30w 4d gestation.  Patient is here for fetal growth assessment and completion of  fetal anatomy. She has the following high-risk problems:  -Chronic hypertension.  Patient takes nifedipine  XL 60 mg  twice daily and labetalol  200 mg 3 times daily.  Her blood  pressures have been controlled during prenatal visits.  Today,  her blood pressure was 162/95 and repeat 150/106 mmHg.  She does not have signs and symptoms of severe features of  preeclampsia.  On labs drawn last week, liver enzymes, creatinine and  platelets were within normal range.  Protein creatinine ratio (3  weeks ago) was increased at 0.8.  -Advanced maternal age.  On cell-free fetal DNA screening,  the risks of fetal aneuploidies are not increased.  - Hypothyroidism.  Recent TSH levels drawn 3 weeks ago  was increased at 39.   Patient takes levothyroxine  250  micrograms daily.  -Grade 3 obesity (pregravid BMI 45).  Obstetrical history significant for a term vaginal delivery of  twin in 2009 and both her children are in good health.  Patient does not have gestational diabetes.  Ultrasound  On today's ultrasound, the estimated fetal weight and the  abdominal circumference measurements are at the first  percentiles.  Head circumference measurement is at between  -2 1-1 SD (normal).  Amniotic fluid is normal and good fetal  activity seen.  Umbilical artery Doppler showed intermittent  reversed end-diastolic flow (REDF).  BPP 8/8.  Breech presentation.  Severe fetal growth restriction  -I counseled the couple on ultrasound findings consistent with  severe fetal growth restriction. ----Most-likely cause is  placental insufficiency that is commonly seen in pregnancies  complicated by chronic hypertension.  -I explained significance of abnormal umbilical artery Doppler  studies with help of images and diagrams. REDF is  associated with severe placental perfusion defects in 70% of  placenta may be compromised.  It is associated with an  increased perinatal mortality rate (30% or higher).  -It is likely that she has superimposed preeclampsia that can  lead to potential maternal complications including eclampsia,  pulmonary edema, endorgan damage and placental  abruption.  -I recommended inpatient management with frequent NST  monitoring and ultrasound.  -I discussed the benefit of antenatal corticosteroids for fetal  pulmonary maturity.  -If severe hypertension is seen and superimposed  preeclampsia is suspected, IV antihypertensives may be  required.  - I discussed magnesium  sulfate both for seizure prophylaxis  and to decrease the incidence of moderate to severe cerebral  palsy in the preterm newborns.  Timing of delivery: In a pregnancy complicated by REDF, we  recommend delivery at 32-weeks' gestation. Patient was  counseled that delivery  between now and 32-weeks'  gestation is possible if fetal compromise or maternal  complications are seen.  -Antenatal testing has limitations in predicting fetal  compromise and stillbirth can occur even with frequent  monitoring.  -Patient was informed that if HELLP syndrome is diagnosed,  she will be  delivered after antenatal corticosteroids.  -I informed the patient that cesarean delivery is more likely  regardless of the fetal presentation.  Discussed findings and recommendations with Dr. Racheal Buddle, who  advised the patient to come over to [facility]. ---------------------------------------------------------------------- Recommendations  -Inpatient management till delivery.  -Antenatal corticosteroids.  -Twice- or thrice-daily NST.  -BPP and UA Doppler tomorrow and then every other day (M-  W-F).  -NICU consultation.  -Magnesium  sulfate to be considered if preeclampsia with  severe features is suspected or delivery is considered.  -Preeclamptic labs. ----------------------------------------------------------------------                 Cassandria Clever, MD Electronically Signed Final Report   07/24/2023 10:39 am ----------------------------------------------------------------------   US  MFM UA CORD DOPPLER Result Date: 07/24/2023 ----------------------------------------------------------------------  OBSTETRICS REPORT                       (Signed Final 07/24/2023 10:39 am) ---------------------------------------------------------------------- Patient Info  ID #:       [id number]                          D.O.B.:  1990/12/15 (32 yrs)(F)  Name:       Tara Woodward                   Visit Date: 07/24/2023 08:12 am ---------------------------------------------------------------------- Performed By  Attending:        Cassandria Clever MD        Ref. Address:     [address]                                                             [address]  Performed By:     Earnest Goad            [facility]  Referred By:      [facility] ---------------------------------------------------------------------- Orders  #  Description                           Code        Ordered  By  1  US  MFM OB FOLLOW UP                   76816.01    YU FANG  2  US  MFM UA CORD DOPPLER                76820.02    YU FANG  3  US  MFM FETAL BPP WO NON               76819.01    YU FANG     STRESS ----------------------------------------------------------------------  #  Order #                     Accession #                Episode #  1  [id number]                   [id number]                 [id number]  2  [id number]                   [id number]                 [id number]  3  [id number]                   [id number]                 [id number] ---------------------------------------------------------------------- Indications  Maternal care for known or suspected poor      O36.5930  fetal growth, third trimester, single or  unspecified fetus IUGR  Obesity complicating pregnancy, third          O99.213  trimester (pregravid BMI 45)  Pre-existing essential hypertension            O10.013  complicating pregnancy, third trimester  (Labetalol  and procardia )  [redacted] weeks gestation of pregnancy                Z3A.30  Low risk NIPS Neg AFP NEG Horizon ---------------------------------------------------------------------- Fetal Evaluation  Num Of Fetuses:         1  Fetal Heart Rate(bpm):  124  Cardiac Activity:       Observed  Presentation:           Breech  Placenta:               Right lateral  P. Cord Insertion:      Previously seen  Amniotic Fluid  AFI FV:      Within normal limits  AFI Sum(cm)     %Tile       Largest Pocket(cm)  10.91           21          3.25  RUQ(cm)       RLQ(cm)       LUQ(cm)        LLQ(cm)  2.78          2.09          2.79            3.25 ---------------------------------------------------------------------- Biophysical Evaluation  Amniotic F.V:   Within normal limits       F. Tone:        Observed  F. Movement:    Observed  Score:          8/8  F. Breathing:   Observed ---------------------------------------------------------------------- Biometry  BPD:      74.3  mm     G. Age:  29w 6d         17  %    CI:        80.59   %    70 - 86                                                          FL/HC:      18.8   %    19.3 - 21.3  HC:      261.4  mm     G. Age:  28w 3d        < 1  %    HC/AC:      1.12        0.96 - 1.17  AC:      234.2  mm     G. Age:  27w 5d        < 1  %    FL/BPD:     66.1   %    71 - 87  FL:       49.1  mm     G. Age:  26w 4d        < 1  %    FL/AC:      21.0   %    20 - 24  HUM:      47.6  mm     G. Age:  28w 0d        < 5  %  CER:      39.6  mm     G. Age:  32w 1d         75  %  LV:        2.3  mm  CM:        7.3  mm  Est. FW:    1088  gm      2 lb 6 oz    < 1  % ---------------------------------------------------------------------- OB History  Gravidity:    2         Term:   2        Prem:   0        SAB:   0  TOP:          0       Ectopic:  0        Living: 2 ---------------------------------------------------------------------- Gestational Age  U/S Today:     28w 1d                                        EDD:   10/15/23  Best:          30w 4d     Det. By:  U/S  (06/06/23)          EDD:   09/28/23 ---------------------------------------------------------------------- Targeted Anatomy  Central Nervous System  Calvarium/Cranial V.:  Appears normal         Cereb./Vermis:          Appears normal  Cavum:  Appears normal         Cisterna Magna:         Appears normal  Lateral Ventricles:    Appears normal         Midline Falx:           Previously seen  Choroid Plexus:        Previously seen  Spine  Cervical:              Ltd previously         Sacral:                 Ltd previously  Thoracic:               Ltd previously         Shape/Curvature:        Previously seen  Lumbar:                Ltd previously  Head/Neck  Lips:                  Previously seen        Profile:                Previously seen  Neck:                  Not well visualized    Orbits/Eyes:            Appears normal  Nuchal Fold:           Not well visualized    Mandible:               Appears normal  Nasal Bone:            Previously seen        Maxilla:                Appears normal  Thorax  4 Chamber View:        Previously seen        Interventr. Septum:     Appears normal  Cardiac Rhythm:        Normal                 Cardiac Axis:           Appears normal  Cardiac Situs:         Previously seen        Diaphragm:              Appears normal  Rt Outflow Tract:      Not well visualized    3 Vessel View:          Not well visualized  Lt Outflow Tract:      Previously seen        3 V Trachea View:       Not well visualized  Aortic Arch:           Not well visualized    IVC:                    Not well visualized  Ductal Arch:           Not well visualized    Crossing:               Previously seen  SVC:                   Not well visualized  Abdomen  Ventral Wall:          Previously seen        Lt Kidney:              Appears normal  Cord Insertion:        Previously seen        Rt Kidney:              Appears normal  Situs:                 Previously seen        Bladder:                Appears normal  Stomach:               Appears normal  Extremities  Lt Humerus:            Previously seen        Lt Femur:               Previously seen  Rt Humerus:            Not well visualized    Rt Femur:               Previously seen  Lt Forearm:            Previously seen        Lt Lower Leg:           Previously seen  Rt Forearm:            Not well visualized    Rt Lower Leg:           Not well visualized  Lt Hand:               Not well visualized    Lt Foot:                Not well visualized  Rt Hand:               Not well visualized    Rt  Foot:                Not well visualized  Other  Umbilical Cord:        Not well visualized  Comment:     Technically difficult due to maternal habitus. ---------------------------------------------------------------------- Doppler - Fetal Vessels  Umbilical Artery                                                              ADFV    RDFV                                                                Yes     Yes  Comment:    Absent with intermittent reversed ---------------------------------------------------------------------- Cervix Uterus Adnexa  Cervix  Not visualized (advanced GA >24wks)  Uterus  No abnormality visualized.  Right Ovary  Size(cm)     3.06   x   2.53   x  1.59  Vol(ml): 6.45  Within normal limits.  Left Ovary  Not visualized.  Cul De Sac  No free fluid seen.  Adnexa  No adnexal mass visualized ---------------------------------------------------------------------- Impression  G2 P1002 at 30w 4d gestation.  Patient is here for fetal growth assessment and completion of  fetal anatomy. She has the following high-risk problems:  -Chronic hypertension.  Patient takes nifedipine  XL 60 mg  twice daily and labetalol  200 mg 3 times daily.  Her blood  pressures have been controlled during prenatal visits.  Today,  her blood pressure was 162/95 and repeat 150/106 mmHg.  She does not have signs and symptoms of severe features of  preeclampsia.  On labs drawn last week, liver enzymes, creatinine and  platelets were within normal range.  Protein creatinine ratio (3  weeks ago) was increased at 0.8.  -Advanced maternal age.  On cell-free fetal DNA screening,  the risks of fetal aneuploidies are not increased.  - Hypothyroidism.  Recent TSH levels drawn 3 weeks ago  was increased at 39.  Patient takes levothyroxine  250  micrograms daily.  -Grade 3 obesity (pregravid BMI 45).  Obstetrical history significant for a term vaginal delivery of  twin in 2009 and both her children are in good health.  Patient does not  have gestational diabetes.  Ultrasound  On today's ultrasound, the estimated fetal weight and the  abdominal circumference measurements are at the first  percentiles.  Head circumference measurement is at between  -2 1-1 SD (normal).  Amniotic fluid is normal and good fetal  activity seen.  Umbilical artery Doppler showed intermittent  reversed end-diastolic flow (REDF).  BPP 8/8.  Breech presentation.  Severe fetal growth restriction  -I counseled the couple on ultrasound findings consistent with  severe fetal growth restriction. ----Most-likely cause is  placental insufficiency that is commonly seen in pregnancies  complicated by chronic hypertension.  -I explained significance of abnormal umbilical artery Doppler  studies with help of images and diagrams. REDF is  associated with severe placental perfusion defects in 70% of  placenta may be compromised.  It is associated with an  increased perinatal mortality rate (30% or higher).  -It is likely that she has superimposed preeclampsia that can  lead to potential maternal complications including eclampsia,  pulmonary edema, endorgan damage and placental  abruption.  -I recommended inpatient management with frequent NST  monitoring and ultrasound.  -I discussed the benefit of antenatal corticosteroids for fetal  pulmonary maturity.  -If severe hypertension is seen and superimposed  preeclampsia is suspected, IV antihypertensives may be  required.  - I discussed magnesium  sulfate both for seizure prophylaxis  and to decrease the incidence of moderate to severe cerebral  palsy in the preterm newborns.  Timing of delivery: In a pregnancy complicated by REDF, we  recommend delivery at 32-weeks' gestation. Patient was  counseled that delivery between now and 32-weeks'  gestation is possible if fetal compromise or maternal  complications are seen.  -Antenatal testing has limitations in predicting fetal  compromise and stillbirth can occur even with frequent  monitoring.   -Patient was informed that if HELLP syndrome is diagnosed,  she will be delivered after antenatal corticosteroids.  -I informed the patient that cesarean delivery is more likely  regardless of the fetal presentation.  Discussed findings and recommendations with Dr. Racheal Buddle, who  advised the patient to come over to [facility]. ---------------------------------------------------------------------- Recommendations  -Inpatient management till delivery.  -Antenatal corticosteroids.  -Twice- or thrice-daily NST.  -BPP and UA  Doppler tomorrow and then every other day (M-  W-F).  -NICU consultation.  -Magnesium  sulfate to be considered if preeclampsia with  severe features is suspected or delivery is considered.  -Preeclamptic labs. ----------------------------------------------------------------------                 Cassandria Clever, MD Electronically Signed Final Report   07/24/2023 10:39 am ----------------------------------------------------------------------   US  MFM FETAL BPP WO NON STRESS Result Date: 07/24/2023 ----------------------------------------------------------------------  OBSTETRICS REPORT                       (Signed Final 07/24/2023 10:39 am) ---------------------------------------------------------------------- Patient Info  ID #:       [id number]                          D.O.B.:  1990-10-18 (32 yrs)(F)  Name:       Tara Woodward                   Visit Date: 07/24/2023 08:12 am ---------------------------------------------------------------------- Performed By  Attending:        Cassandria Clever MD        Ref. Address:     [address]                                                             [address]  Performed By:     Earnest Goad            [facility]  Referred By:       [facility] ---------------------------------------------------------------------- Orders  #  Description                           Code        Ordered By  1  US  MFM OB FOLLOW UP                   [id number]    YU FANG  2  US  MFM UA CORD DOPPLER                02725.36  YU FANG  3  US  MFM FETAL BPP WO NON               [id number]    YU FANG     STRESS ----------------------------------------------------------------------  #  Order #                     Accession #                Episode #  1  [id number]                   [id number]                 [id number]  2  [id number]                   [id number]                 [id number]  3  [id number]                   [id number]                 [id number] ---------------------------------------------------------------------- Indications  Maternal care for known or suspected poor      O36.5930  fetal growth, third trimester, single or  unspecified fetus IUGR  Obesity complicating pregnancy, third          O99.213  trimester (pregravid BMI 45)  Pre-existing essential hypertension            O10.013  complicating pregnancy, third trimester  (Labetalol  and procardia )  [redacted] weeks gestation of pregnancy                Z3A.30  Low risk NIPS Neg AFP NEG Horizon ---------------------------------------------------------------------- Fetal Evaluation  Num Of Fetuses:         1  Fetal Heart Rate(bpm):  124  Cardiac Activity:       Observed  Presentation:           Breech  Placenta:               Right lateral  P. Cord Insertion:      Previously seen  Amniotic Fluid  AFI FV:      Within normal limits  AFI Sum(cm)     %Tile       Largest Pocket(cm)  10.91           21          3.25  RUQ(cm)       RLQ(cm)       LUQ(cm)        LLQ(cm)  2.78          2.09          2.79           3.25 ---------------------------------------------------------------------- Biophysical Evaluation  Amniotic F.V:   Within normal limits       F. Tone:        Observed  F. Movement:    Observed                   Score:           8/8  F. Breathing:   Observed ---------------------------------------------------------------------- Biometry  BPD:      74.3  mm     G. Age:  29w 6d         17  %    CI:  80.59   %    70 - 86                                                          FL/HC:      18.8   %    19.3 - 21.3  HC:      261.4  mm     G. Age:  28w 3d        < 1  %    HC/AC:      1.12        0.96 - 1.17  AC:      234.2  mm     G. Age:  27w 5d        < 1  %    FL/BPD:     66.1   %    71 - 87  FL:       49.1  mm     G. Age:  26w 4d        < 1  %    FL/AC:      21.0   %    20 - 24  HUM:      47.6  mm     G. Age:  28w 0d        < 5  %  CER:      39.6  mm     G. Age:  32w 1d         75  %  LV:        2.3  mm  CM:        7.3  mm  Est. FW:    1088  gm      2 lb 6 oz    < 1  % ---------------------------------------------------------------------- OB History  Gravidity:    2         Term:   2        Prem:   0        SAB:   0  TOP:          0       Ectopic:  0        Living: 2 ---------------------------------------------------------------------- Gestational Age  U/S Today:     28w 1d                                        EDD:   10/15/23  Best:          30w 4d     Det. By:  U/S  (06/06/23)          EDD:   09/28/23 ---------------------------------------------------------------------- Targeted Anatomy  Central Nervous System  Calvarium/Cranial V.:  Appears normal         Cereb./Vermis:          Appears normal  Cavum:                 Appears normal         Cisterna Magna:         Appears normal  Lateral Ventricles:    Appears normal         Midline Falx:  Previously seen  Choroid Plexus:        Previously seen  Spine  Cervical:              Ltd previously         Sacral:                 Ltd previously  Thoracic:              Ltd previously         Shape/Curvature:        Previously seen  Lumbar:                Ltd previously  Head/Neck  Lips:                  Previously seen        Profile:                Previously seen  Neck:                   Not well visualized    Orbits/Eyes:            Appears normal  Nuchal Fold:           Not well visualized    Mandible:               Appears normal  Nasal Bone:            Previously seen        Maxilla:                Appears normal  Thorax  4 Chamber View:        Previously seen        Interventr. Septum:     Appears normal  Cardiac Rhythm:        Normal                 Cardiac Axis:           Appears normal  Cardiac Situs:         Previously seen        Diaphragm:              Appears normal  Rt Outflow Tract:      Not well visualized    3 Vessel View:          Not well visualized  Lt Outflow Tract:      Previously seen        3 V Trachea View:       Not well visualized  Aortic Arch:           Not well visualized    IVC:                    Not well visualized  Ductal Arch:           Not well visualized    Crossing:               Previously seen  SVC:                   Not well visualized  Abdomen  Ventral Wall:          Previously seen        Lt Kidney:              Appears normal  Cord Insertion:        Previously seen  Rt Kidney:              Appears normal  Situs:                 Previously seen        Bladder:                Appears normal  Stomach:               Appears normal  Extremities  Lt Humerus:            Previously seen        Lt Femur:               Previously seen  Rt Humerus:            Not well visualized    Rt Femur:               Previously seen  Lt Forearm:            Previously seen        Lt Lower Leg:           Previously seen  Rt Forearm:            Not well visualized    Rt Lower Leg:           Not well visualized  Lt Hand:               Not well visualized    Lt Foot:                Not well visualized  Rt Hand:               Not well visualized    Rt Foot:                Not well visualized  Other  Umbilical Cord:        Not well visualized  Comment:     Technically difficult due to maternal habitus. ----------------------------------------------------------------------  Doppler - Fetal Vessels  Umbilical Artery                                                              ADFV    RDFV                                                                Yes     Yes  Comment:    Absent with intermittent reversed ---------------------------------------------------------------------- Cervix Uterus Adnexa  Cervix  Not visualized (advanced GA >24wks)  Uterus  No abnormality visualized.  Right Ovary  Size(cm)     3.06   x   2.53   x  1.59      Vol(ml): 6.45  Within normal limits.  Left Ovary  Not visualized.  Cul De Sac  No free fluid seen.  Adnexa  No adnexal mass visualized ---------------------------------------------------------------------- Impression  G2 P1002 at 30w 4d gestation.  Patient is here for fetal growth assessment and completion of  fetal anatomy. She has the following  high-risk problems:  -Chronic hypertension.  Patient takes nifedipine  XL 60 mg  twice daily and labetalol  200 mg 3 times daily.  Her blood  pressures have been controlled during prenatal visits.  Today,  her blood pressure was 162/95 and repeat 150/106 mmHg.  She does not have signs and symptoms of severe features of  preeclampsia.  On labs drawn last week, liver enzymes, creatinine and  platelets were within normal range.  Protein creatinine ratio (3  weeks ago) was increased at 0.8.  -Advanced maternal age.  On cell-free fetal DNA screening,  the risks of fetal aneuploidies are not increased.  - Hypothyroidism.  Recent TSH levels drawn 3 weeks ago  was increased at 39.  Patient takes levothyroxine  250  micrograms daily.  -Grade 3 obesity (pregravid BMI 45).  Obstetrical history significant for a term vaginal delivery of  twin in 2009 and both her children are in good health.  Patient does not have gestational diabetes.  Ultrasound  On today's ultrasound, the estimated fetal weight and the  abdominal circumference measurements are at the first  percentiles.  Head circumference measurement is at between  -2 1-1 SD  (normal).  Amniotic fluid is normal and good fetal  activity seen.  Umbilical artery Doppler showed intermittent  reversed end-diastolic flow (REDF).  BPP 8/8.  Breech presentation.  Severe fetal growth restriction  -I counseled the couple on ultrasound findings consistent with  severe fetal growth restriction. ----Most-likely cause is  placental insufficiency that is commonly seen in pregnancies  complicated by chronic hypertension.  -I explained significance of abnormal umbilical artery Doppler  studies with help of images and diagrams. REDF is  associated with severe placental perfusion defects in 70% of  placenta may be compromised.  It is associated with an  increased perinatal mortality rate (30% or higher).  -It is likely that she has superimposed preeclampsia that can  lead to potential maternal complications including eclampsia,  pulmonary edema, endorgan damage and placental  abruption.  -I recommended inpatient management with frequent NST  monitoring and ultrasound.  -I discussed the benefit of antenatal corticosteroids for fetal  pulmonary maturity.  -If severe hypertension is seen and superimposed  preeclampsia is suspected, IV antihypertensives may be  required.  - I discussed magnesium  sulfate both for seizure prophylaxis  and to decrease the incidence of moderate to severe cerebral  palsy in the preterm newborns.  Timing of delivery: In a pregnancy complicated by REDF, we  recommend delivery at 32-weeks' gestation. Patient was  counseled that delivery between now and 32-weeks'  gestation is possible if fetal compromise or maternal  complications are seen.  -Antenatal testing has limitations in predicting fetal  compromise and stillbirth can occur even with frequent  monitoring.  -Patient was informed that if HELLP syndrome is diagnosed,  she will be delivered after antenatal corticosteroids.  -I informed the patient that cesarean delivery is more likely  regardless of the fetal presentation.   Discussed findings and recommendations with Dr. Racheal Buddle, who  advised the patient to come over to [facility]. ---------------------------------------------------------------------- Recommendations  -Inpatient management till delivery.  -Antenatal corticosteroids.  -Twice- or thrice-daily NST.  -BPP and UA Doppler tomorrow and then every other day (M-  W-F).  -NICU consultation.  -Magnesium  sulfate to be considered if preeclampsia with  severe features is suspected or delivery is considered.  -Preeclamptic labs. ----------------------------------------------------------------------                 Cassandria Clever, MD Electronically  Signed Final Report   07/24/2023 10:39 am ----------------------------------------------------------------------   US  MFM OB DETAIL +14 WK Result Date: 06/26/2023 ----------------------------------------------------------------------  OBSTETRICS REPORT                       (Signed Final 06/26/2023 10:37 am) ---------------------------------------------------------------------- Patient Info  ID #:       [id number]                          D.O.B.:  1990/05/25 (32 yrs)(F)  Name:       Tara Woodward                   Visit Date: 06/25/2023 09:14 am ---------------------------------------------------------------------- Performed By  Attending:        Sal Crass MD         Ref. Address:     [address]                                                             [address]  Performed By:     Marybeth Smock         [facility]  Referred By:      [facility] ---------------------------------------------------------------------- Orders  #  Description                           Code        Ordered By  1  US  MFM OB DETAIL +14 WK               40981.19    Tiffany Foerster  ----------------------------------------------------------------------  #  Order #                     Accession #                Episode #  1  [id number]                   [id number]                 [id number] ---------------------------------------------------------------------- Indications  Obesity complicating pregnancy, second         O99.212  trimester (BMI 45)  Hypertension - Chronic/Pre-existing            O10.019  [redacted] weeks gestation of pregnancy                Z3A.26  Low risk NIPS Neg AFP NEG Horizon ---------------------------------------------------------------------- Vital Signs  BP:          175/102 ---------------------------------------------------------------------- Fetal Evaluation  Num Of Fetuses:         1  Fetal Heart Rate(bpm):  134  Cardiac Activity:       Observed  Presentation:           Breech  Placenta:               Fundal with an extra lobe anterior  P. Cord Insertion:      Visualized  Amniotic Fluid  AFI FV:      Within normal limits  AFI Sum(cm)     %Tile       Largest Pocket(cm)  10.82           16          4.72  RUQ(cm)       RLQ(cm)       LUQ(cm)        LLQ(cm)  4.72          2.86          3.24           0 ---------------------------------------------------------------------- Biometry  BPD:      69.8  mm     G. Age:  28w 0d         88  %    CI:        76.04   %    70 - 86                                                          FL/HC:      18.0   %    18.6 - 20.4  HC:      253.7  mm     G. Age:  27w 4d         63  %    HC/AC:      1.18        1.04 - 1.22  AC:      215.3  mm     G. Age:  26w 0d         29  %    FL/BPD:     65.3   %    71 - 87  FL:       45.6  mm     G. Age:  25w 1d          7  %    FL/AC:      21.2   %    20 - 24  HUM:      43.4  mm     G. Age:  25w 6d         34  %  CER:      33.3  mm     G. Age:  28w 2d         93  %  LV:        5.1  mm  Est. FW:     873  gm    1 lb 15 oz      21  % ---------------------------------------------------------------------- OB History   Gravidity:    2         Term:   2        Prem:   0        SAB:   0  TOP:          0       Ectopic:  0        Living: 2 ---------------------------------------------------------------------- Gestational Age  U/S Today:     26w 5d                                        EDD:   09/26/23  Best:          26w 3d     Det. By:  U/S  (06/06/23)          EDD:   09/28/23 ---------------------------------------------------------------------- Targeted Anatomy  Central Nervous System  Calvarium/Cranial V.:  Appears normal         Cereb./Vermis:          Appears normal  Cavum:                 Appears normal         Cisterna Magna:         Not well visualized  Lateral Ventricles:    Appears normal         Midline Falx:           Appears normal  Choroid Plexus:        Appears normal  Spine  Cervical:              Limited                Sacral:                 Limited  Thoracic:              Limited                Shape/Curvature:        Appears normal  Lumbar:                Limited  Head/Neck  Lips:                  Appears normal         Profile:                Appears normal  Neck:                  Not well visualized    Orbits/Eyes:            Not well visualized  Nuchal Fold:           Not well visualized    Mandible:               Not well visualized  Nasal Bone:            Present                Maxilla:                Not  well visualized  Thorax  4 Chamber View:        Appears normal         Interventr. Septum:     Appears normal  Cardiac Rhythm:        Normal                 Cardiac Axis:           Appears normal  Cardiac Situs:         Appears normal         Diaphragm:              Appears normal  Rt Outflow Tract:      Not well visualized    3 Vessel View:          Not well visualized  Lt Outflow Tract:      Appears normal         3 V Trachea View:       Not well visualized  Aortic Arch:           Not well visualized    IVC:                    Not well visualized  Ductal Arch:           Not well visualized    Crossing:                Appears normal  SVC:                   Not well visualized  Abdomen  Ventral Wall:          Appears normal         Lt Kidney:              Appears normal  Cord Insertion:        Appears normal         Rt Kidney:              Appears normal  Situs:                 Appears normal         Bladder:                Appears normal  Stomach:               Appears normal  Extremities  Lt Humerus:            Appears normal         Lt Femur:               Appears normal  Rt Humerus:            Not well visualized    Rt Femur:               Appears normal  Lt Forearm:            Appears normal         Lt Lower Leg:           Appears normal  Rt Forearm:            Not well visualized    Rt Lower Leg:           Not well visualized  Lt Hand:               Not well visualized    Lt Foot:  Not well visualized  Rt Hand:               Not well visualized    Rt Foot:                Not well visualized  Other  Umbilical Cord:        Not well visualized ---------------------------------------------------------------------- Cervix Uterus Adnexa  Cervix  Length:            4.3  cm.  Normal appearance by transabdominal scan  Uterus  No abnormality visualized.  Right Ovary  Not visualized.  Left Ovary  Not visualized.  Cul De Sac  No free fluid seen.  Adnexa  No abnormality visualized ---------------------------------------------------------------------- Comments  Tara Woodward is currently at 26 weeks and 3 days.  She was  seen due to maternal obesity with a BMI of 33, advanced  maternal age, hypothyroidism currently treated with  Synthroid , and chronic hypertension treated with Procardia   30 mg XL daily.  Her most recent TSH level was elevated  (10.7).  Her blood pressures today were 175/102 and 170/113.  She  denies any signs or symptoms of preeclampsia.  She had a cell free DNA test earlier in her pregnancy which  indicated a low risk for trisomy 67, 1, and 13. A female fetus is  predicted.  On today's exam, the  overall EFW of 1 pound 15 ounces  measures at the 21st percentile for her gestational age.  There was normal amniotic fluid noted.  There were no obvious fetal anomalies noted on today's  ultrasound exam.  However, today's exam was limited due to  the fetal position and maternal body habitus.  A possible anterior succenturiate lobe of the placenta was  noted on today's exam.  The patient was advised that a succenturiate lobe is a  separate accessory lobe of the placenta that may be  connected to the main lobe of the placenta via a blood  vessel.  There were no signs of vasa previa noted in the  lower uterine segment today.  Care should be taken to ensure that the succenturiate lobe is  removed after delivery.  We will continue to assess the  placenta during her future exams.  The patient was informed that anomalies may be missed due  to technical limitations. If the fetus is in a suboptimal position  or maternal habitus is increased, visualization of the fetus in  the maternal uterus may be impaired.  The following were discussed during today's consultation:  Chronic hypertension in pregnancy  The implications and management of chronic hypertension in  pregnancy was discussed.  Due to her extremely elevated blood pressures, she was  advised that the dosage of her Procardia  will most likely need  to be increased.  The patient was hesitant to go to the MAU  for blood pressure monitoring today.  I advised her to take an extra 30 mg of Procardia  when she  gets home and to call me later this afternoon regarding what  her blood pressure is after she takes the additional 30 mg of  Procardia .  The patient called me at 4:30 PM and told me that her blood  pressure was 150/100 after taking an additional 30 mg of  Procardia  when she got home.  I advised her to take an extra 30 mg of Procardia  this  evening, making the total dosage of Procardia  90 mg daily.  I called the [facility] office and got her an appointment  for  a blood  pressure check and medication dosage adjustment  on June 26, 2023 (tomorrow afternoon).  She should be maintained on 90 mg of Procardia  XL daily.  Labetalol  may be added should her blood pressures continue  to be elevated despite the increased Procardia  dose.  The increased risk of superimposed preeclampsia, an  indicated preterm delivery, and possible fetal growth  restriction due to chronic hypertension in pregnancy was  discussed.  We will continue to follow her with monthly growth scans.  Weekly fetal testing should be started at around 32 weeks.  To decrease her risk of superimposed preeclampsia, she was  advised to continue taking a daily baby aspirin  (81 mg daily)  for preeclampsia prophylaxis.  Preeclampsia precautions were reviewed.  Advanced maternal age in pregnancy  The increased risk of fetal aneuploidy due to advanced  maternal age was discussed.  Due to advanced maternal age, the patient was offered and  declined an amniocentesis today for definitive diagnosis of  fetal aneuploidy.  She is comfortable with the low risk  indicated by her cell free DNA test.  Hypothyroidism in pregnancy  The patient was advised to continue taking Synthroid  for the  duration of her pregnancy.  She should have her thyroid  function tests (TSH and free T4  levels) monitored on a monthly basis to determine if any  adjustments to her Synthroid  dose is needed.  The patient stated that all of her questions were answered  today.  A total of 60 minutes was spent counseling and coordinating  the care for this patient.  Greater than 50% of the time was  spent in direct face-to-face contact. 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 07/17/2023   Maternal morbid obesity, antepartum (HCC) 06/26/2023   Rubella non-immune status, antepartum 06/08/2023   Supervision of high risk pregnancy, antepartum 06/06/2023   Chronic hypertension with superimposed preeclampsia 06/06/2023   Thyroid  disease during pregnancy in third trimester 06/06/2023   Insomnia 10/04/2021   GERD (gastroesophageal reflux disease) 10/04/2021   HLD (hyperlipidemia) 04/20/2021   Class 3 severe obesity due to excess calories with body mass index (BMI) of 45.0 to 49.9 in adult 03/15/2021   Acquired hypothyroidism 03/15/2021   Major depression, recurrent (HCC) 11/08/2016   HTN (hypertension) 01/24/2013   Admit to Antenatal Betamethasone  x 2 doses Magnesium  sulfate for CP prophylaxis if blood pressure becomes severe or testing suggests possible earlier delivery, one large variable noted on current monitoring, if there are any further significant variables, will move up second dose of steroids and start magnesium  sulfate for neuroprotection. Twice daily NST, M W F BPP with dopplers Per MFM delivery at 32 weeks if condition remains stable, fetal position currently breech, would need   NICU consult ordered  Routine antenatal care  Avie Boeck, MD Faculty attending, Center for [facility] care

## 2023-07-24 NOTE — Consult Note (Signed)
 [facility]  Prenatal Consult       07/24/2023  I was asked by Dr. Racheal Buddle to consult on this patient for impending preterm delivery.  I had the pleasure of meeting with Tara Woodward today.  She is a 33 y.o. G2P1002 at [redacted]w[redacted]d admitted for chronic hypertension, maternal obesity and severe IUGR.  Plan for her to be monitored inpatient until delivery, which MFM recommends at 32 weeks if there are not sooner indications.  She is currently receiving betamethasone  with plan for neuroprotective magnesium  closer to delivery.  I explained that the neonatal intensive care team would be present for the delivery and outlined the likely delivery room course for this baby including routine resuscitation and NRP-guided approaches to the treatment of respiratory distress. We discussed other common problems associated with prematurity including respiratory distress syndrome/CLD, apnea, feeding issues, temperature regulation, and infection risk.  We briefly discussed IVH/PVL, ROP, and NEC and that these are complications associated with prematurity, but that by 30 weeks are uncommon.    We discussed the average length of stay but I noted that the actual LOS would depend on the severity of problems encountered and response to treatments.  We discussed visitation policies and the resources available while her child is in the hospital.  We discussed the importance of good nutrition and various methods of providing nutrition (parenteral hyperalimentation, gavage feedings and/or oral feeding). We discussed the benefits of human milk. I encouraged breast feeding and pumping soon after birth and outlined resources that are available to support breast feeding.   Thank you for involving us  in the care of this patient. A member of our team will be available should the family have additional questions.  Time for consultation approximately 20 minutes.   _____________________ Electronically Signed  By: Tara Mates, MD, MS Neonatologist

## 2023-07-24 NOTE — Progress Notes (Signed)
 Maternal-Fetal Medicine Consultation Name: Tara Woodward MRN: [id number]  G2 P1002 at 30w 4d gestation. Patient is here for fetal growth assessment and completion of fetal anatomy. She has the following high-risk problems: -Chronic hypertension.  Patient takes nifedipine  XL 60 mg twice daily and labetalol  200 mg 3 times daily.  Her blood pressures have been controlled during prenatal visits.  Today, her blood pressure was 162/95 and repeat 150/106 mmHg.  She does not have signs and symptoms of severe features of preeclampsia. On labs drawn last week, liver enzymes, creatinine and platelets were within normal range.  Protein creatinine ratio (3 weeks ago) was increased at 0.8. -Advanced maternal age.  On cell-free fetal DNA screening, the risks of fetal aneuploidies are not increased. - Hypothyroidism.  Recent TSH levels drawn 3 weeks ago was increased at 39.  Patient takes levothyroxine  250 micrograms daily. -Grade 3 obesity (pregravid BMI 45).  Obstetrical history significant for a term vaginal delivery of twin in 2009 and both her children are in good health.  Patient does not have gestational diabetes.  Ultrasound On today's ultrasound, the estimated fetal weight and the abdominal circumference measurements are at the first percentiles.  Head circumference measurement is at between -2 1-1 SD (normal).  Amniotic fluid is normal and good fetal activity seen.  Umbilical artery Doppler showed intermittent reversed end-diastolic flow (REDF).  BPP 8/8. Breech presentation.  Severe fetal growth restriction -I counseled the couple on ultrasound findings consistent with severe fetal growth restriction. ----Most-likely cause is placental insufficiency that is commonly seen in pregnancies complicated by chronic hypertension. -I explained significance of abnormal umbilical artery Doppler studies with help of images and diagrams. REDF is associated with severe placental perfusion defects in 70% of placenta  may be compromised.  It is associated with an increased perinatal mortality rate (30% or higher). -It is likely that she has superimposed preeclampsia that can lead to potential maternal complications including eclampsia, pulmonary edema, endorgan damage and placental abruption. -I recommended inpatient management with frequent NST monitoring and ultrasound. -I discussed the benefit of antenatal corticosteroids for fetal pulmonary maturity. -If severe hypertension is seen and superimposed preeclampsia is suspected, IV antihypertensives may be required. - I discussed magnesium  sulfate both for seizure prophylaxis and to decrease the incidence of moderate to severe cerebral palsy in the preterm newborns.   Timing of delivery: In a pregnancy complicated by REDF, we recommend delivery at 32-weeks' gestation. Patient was counseled that delivery between now and 32-weeks' gestation is possible if fetal compromise or maternal complications are seen.  -Antenatal testing has limitations in predicting fetal compromise and stillbirth can occur even with frequent monitoring. -Patient was informed that if HELLP syndrome is diagnosed, she will be delivered after antenatal corticosteroids. -I informed the patient that cesarean delivery is more likely regardless of the fetal presentation.  Discussed findings and recommendations with Dr. Racheal Buddle, who advised the patient to come over to [facility].  Recommendations -Inpatient management till delivery. -Antenatal corticosteroids. -Twice- or thrice-daily NST. -BPP and UA Doppler tomorrow and then every other day (M-W-F). -NICU consultation. -Magnesium  sulfate to be considered if preeclampsia with severe features is suspected or delivery is considered. -Preeclamptic labs.   Consultation including face-to-face (more than 50%) counseling 45 minutes.

## 2023-07-25 ENCOUNTER — Inpatient Hospital Stay (HOSPITAL_BASED_OUTPATIENT_CLINIC_OR_DEPARTMENT_OTHER)

## 2023-07-25 DIAGNOSIS — O99213 Obesity complicating pregnancy, third trimester: Secondary | ICD-10-CM | POA: Diagnosis not present

## 2023-07-25 DIAGNOSIS — Z3A30 30 weeks gestation of pregnancy: Secondary | ICD-10-CM

## 2023-07-25 DIAGNOSIS — E669 Obesity, unspecified: Secondary | ICD-10-CM | POA: Diagnosis not present

## 2023-07-25 DIAGNOSIS — E079 Disorder of thyroid, unspecified: Secondary | ICD-10-CM | POA: Diagnosis not present

## 2023-07-25 DIAGNOSIS — O99283 Endocrine, nutritional and metabolic diseases complicating pregnancy, third trimester: Secondary | ICD-10-CM | POA: Diagnosis not present

## 2023-07-25 DIAGNOSIS — O365930 Maternal care for other known or suspected poor fetal growth, third trimester, not applicable or unspecified: Secondary | ICD-10-CM | POA: Diagnosis not present

## 2023-07-25 DIAGNOSIS — O10013 Pre-existing essential hypertension complicating pregnancy, third trimester: Secondary | ICD-10-CM

## 2023-07-25 MED ORDER — LACTATED RINGERS IV BOLUS
1000.0000 mL | Freq: Once | INTRAVENOUS | Status: AC
  Administered 2023-07-25: 1000 mL via INTRAVENOUS

## 2023-07-25 MED ORDER — LACTATED RINGERS IV BOLUS
500.0000 mL | Freq: Once | INTRAVENOUS | Status: AC
  Administered 2023-07-25: 500 mL via INTRAVENOUS

## 2023-07-25 NOTE — Progress Notes (Addendum)
 FACULTY PRACTICE ANTEPARTUM PROGRESS NOTE  Tara Woodward is a 33 y.o. G2P1002 at [redacted]w[redacted]d who is admitted for mild preeclampsia, FGR and abnormal testing with intermittent REDF..  Estimated Date of Delivery: 09/28/23 Fetal presentation is breech.  Length of Stay:  1 Days. Admitted 07/24/2023  Subjective: Pt comfortable.  She denies headache, visual changes and RUQ pain. Patient reports normal fetal movement.  She denies uterine contractions, denies bleeding and leaking of fluid per vagina.  Vitals:  Blood pressure 128/83, pulse 83, temperature 97.9 F (36.6 C), temperature source Oral, resp. rate 18, height 5\' 5"  (1.651 m), weight (!) 143.6 kg, SpO2 99%. Physical Examination: CONSTITUTIONAL: obese, Well-developed, well-nourished female in no acute distress.  HENT:  Normocephalic, atraumatic, External right and left ear normal. Oropharynx is clear and moist EYES: Conjunctivae and EOM are normal.  NECK: Normal range of motion, supple, no masses. SKIN: Skin is warm and dry. No rash noted. Not diaphoretic. No erythema. No pallor. NEUROLGIC: Alert and oriented to person, place, and time. Normal reflexes, muscle tone coordination. No cranial nerve deficit noted. PSYCHIATRIC: Normal mood and affect. Normal behavior. Normal judgment and thought content. CARDIOVASCULAR: Normal heart rate noted, regular rhythm RESPIRATORY: Effort and breath sounds normal, no problems with respiration noted MUSCULOSKELETAL: Normal range of motion. No edema and no tenderness. ABDOMEN: Soft, nontender, nondistended, gravid. CERVIX: deferred  Fetal monitoring: FHR: 130s bpm, Variability: moderate, Accelerations: Present, Decelerations: none Uterine activity: none   Results for orders placed or performed during the hospital encounter of 07/24/23 (from the past 48 hours)  Protein / creatinine ratio, urine     Status: Abnormal   Collection Time: 07/24/23 11:22 AM  Result Value Ref Range   Creatinine, Urine 29 mg/dL    Total Protein, Urine 154 mg/dL    Comment: RESULTS CONFIRMED BY MANUAL DILUTION NO NORMAL RANGE ESTABLISHED FOR THIS TEST    Protein Creatinine Ratio 5.31 (H) 0.00 - 0.15 mg/mg[Cre]    Comment: Performed at [facility], [address] [place], [place] [address]  Comprehensive metabolic panel     Status: Abnormal   Collection Time: 07/24/23 12:25 PM  Result Value Ref Range   Sodium 135 135 - 145 mmol/L   Potassium 3.8 3.5 - 5.1 mmol/L   Chloride 101 98 - 111 mmol/L   CO2 23 22 - 32 mmol/L   Glucose, Bld 74 70 - 99 mg/dL    Comment: Glucose reference range applies only to samples taken after fasting for at least 8 hours.   BUN 6 6 - 20 mg/dL   Creatinine, Ser 1.91 0.44 - 1.00 mg/dL   Calcium  8.7 (L) 8.9 - 10.3 mg/dL   Total Protein 6.4 (L) 6.5 - 8.1 g/dL   Albumin 2.3 (L) 3.5 - 5.0 g/dL   AST 15 15 - 41 U/L   ALT 11 0 - 44 U/L   Alkaline Phosphatase 159 (H) 38 - 126 U/L   Total Bilirubin 0.5 0.0 - 1.2 mg/dL   GFR, Estimated >47 >82 mL/min    Comment: (NOTE) Calculated using the CKD-EPI Creatinine Equation (2021)    Anion gap 11 5 - 15    Comment: Performed at [facility], [address], [place], [place] [address]  TSH     Status: Abnormal   Collection Time: 07/24/23 12:25 PM  Result Value Ref Range   TSH 27.948 (H) 0.350 - 4.500 uIU/mL    Comment: Performed by a 3rd Generation assay with a functional sensitivity of <=  0.01 uIU/mL. Performed at [facility], [address] [place], [place] [address]   T4, free     Status: Abnormal   Collection Time: 07/24/23 12:25 PM  Result Value Ref Range   Free T4 0.57 (L) 0.61 - 1.12 ng/dL    Comment: (NOTE) Biotin ingestion may interfere with free T4 tests. If the results are inconsistent with the TSH level, previous test results, or the clinical presentation, then consider biotin interference. If needed, order repeat testing after stopping biotin. Performed at [facility], [address] [place],  [place] [address]   CBC     Status: Abnormal   Collection Time: 07/24/23 12:25 PM  Result Value Ref Range   WBC 8.5 4.0 - 10.5 K/uL   RBC 4.13 3.87 - 5.11 MIL/uL   Hemoglobin 12.3 12.0 - 15.0 g/dL   HCT 09.8 11.9 - 14.7 %   MCV 87.9 80.0 - 100.0 fL   MCH 29.8 26.0 - 34.0 pg   MCHC 33.9 30.0 - 36.0 g/dL   RDW 82.9 56.2 - 13.0 %   Platelets 412 (H) 150 - 400 K/uL   nRBC 0.0 0.0 - 0.2 %    Comment: Performed at [facility], [address] [place], [place] [address]  Type and screen [facility]     Status: None   Collection Time: 07/24/23 12:28 PM  Result Value Ref Range   ABO/RH(D) A POS    Antibody Screen NEG    Sample Expiration      07/27/2023,2359 Performed at [facility], [address], [place], [place] [address]     I have reviewed the patient's current medications.  ASSESSMENT: Principal Problem:   Chronic hypertension in obstetric context in third trimester Fetal growth restriction SIPE without severe features   PLAN: #FGR with intermittent REDF Continue inpatient monitoring until delivery Scheduled for pLTCS due to breech position. She understands we can convert to IOL if position changes.  She would like BTS at the time of her c-section.  BPP/doppler results with intermittent REDF.  BMZ 5/20-21 Continue Q72 hour labs Twice daily testing Delivery at 32 weeks pending clinical status She is s/p NICU consult.   #Hypothyroidism - Continue Synthroid . TSH dropped from 39 to 27  #FWB - Reactive NST   Continue routine antenatal care.  Lacey Pian, MD [facility] Faculty Attending, Center for [facility] 07/25/2023 9:58 AM

## 2023-07-27 ENCOUNTER — Encounter (HOSPITAL_COMMUNITY)
Admission: AD | Disposition: A | Discharge status: Home / Self Care | Payer: Self-pay | Source: Home / Self Care | Attending: Obstetrics and Gynecology

## 2023-07-27 ENCOUNTER — Encounter (HOSPITAL_COMMUNITY): Payer: Self-pay | Admitting: Obstetrics and Gynecology

## 2023-07-27 ENCOUNTER — Inpatient Hospital Stay (HOSPITAL_COMMUNITY): Admitting: Certified Registered Nurse Anesthetist

## 2023-07-27 ENCOUNTER — Other Ambulatory Visit: Payer: Self-pay

## 2023-07-27 ENCOUNTER — Inpatient Hospital Stay (HOSPITAL_COMMUNITY)

## 2023-07-27 DIAGNOSIS — Z3A30 30 weeks gestation of pregnancy: Secondary | ICD-10-CM

## 2023-07-27 DIAGNOSIS — Z3A31 31 weeks gestation of pregnancy: Secondary | ICD-10-CM

## 2023-07-27 DIAGNOSIS — O1414 Severe pre-eclampsia complicating childbirth: Secondary | ICD-10-CM

## 2023-07-27 DIAGNOSIS — O99284 Endocrine, nutritional and metabolic diseases complicating childbirth: Secondary | ICD-10-CM

## 2023-07-27 DIAGNOSIS — Z3490 Encounter for supervision of normal pregnancy, unspecified, unspecified trimester: Secondary | ICD-10-CM

## 2023-07-27 DIAGNOSIS — O99214 Obesity complicating childbirth: Secondary | ICD-10-CM | POA: Diagnosis not present

## 2023-07-27 DIAGNOSIS — O10913 Unspecified pre-existing hypertension complicating pregnancy, third trimester: Secondary | ICD-10-CM

## 2023-07-27 DIAGNOSIS — O321XX0 Maternal care for breech presentation, not applicable or unspecified: Secondary | ICD-10-CM

## 2023-07-27 DIAGNOSIS — Z302 Encounter for sterilization: Secondary | ICD-10-CM

## 2023-07-27 DIAGNOSIS — O365930 Maternal care for other known or suspected poor fetal growth, third trimester, not applicable or unspecified: Secondary | ICD-10-CM

## 2023-07-27 LAB — COMPREHENSIVE METABOLIC PANEL WITH GFR
ALT: 11 U/L (ref 0–44)
AST: 20 U/L (ref 15–41)
Albumin: 2.1 g/dL — ABNORMAL LOW (ref 3.5–5.0)
Alkaline Phosphatase: 142 U/L — ABNORMAL HIGH (ref 38–126)
Anion gap: 8 (ref 5–15)
BUN: 11 mg/dL (ref 6–20)
CO2: 24 mmol/L (ref 22–32)
Calcium: 8 mg/dL — ABNORMAL LOW (ref 8.9–10.3)
Chloride: 102 mmol/L (ref 98–111)
Creatinine, Ser: 0.54 mg/dL (ref 0.44–1.00)
GFR, Estimated: >60 mL/min (ref >60)
Glucose, Bld: 79 mg/dL (ref 70–99)
Potassium: 4.5 mmol/L (ref 3.5–5.1)
Sodium: 134 mmol/L — ABNORMAL LOW (ref 135–145)
Total Bilirubin: 0.2 mg/dL (ref 0.0–1.2)
Total Protein: 5.9 g/dL — ABNORMAL LOW (ref 6.5–8.1)

## 2023-07-27 LAB — CBC
HCT: 34.5 % — ABNORMAL LOW (ref 36.0–46.0)
Hemoglobin: 11.5 g/dL — ABNORMAL LOW (ref 12.0–15.0)
MCH: 29.9 pg (ref 26.0–34.0)
MCHC: 33.3 g/dL (ref 30.0–36.0)
MCV: 89.6 fL (ref 80.0–100.0)
Platelets: 418 10*3/uL — ABNORMAL HIGH (ref 150–400)
RBC: 3.85 MIL/uL — ABNORMAL LOW (ref 3.87–5.11)
RDW: 13 % (ref 11.5–15.5)
WBC: 11.6 10*3/uL — ABNORMAL HIGH (ref 4.0–10.5)
nRBC: 0.2 % (ref 0.0–0.2)

## 2023-07-27 LAB — TYPE AND SCREEN
ABO/RH(D): A POS
Antibody Screen: NEGATIVE

## 2023-07-27 SURGERY — Surgical Case
Anesthesia: Spinal | Site: Abdomen

## 2023-07-27 SURGERY — Surgical Case
Anesthesia: Regional

## 2023-07-27 MED ORDER — ACETAMINOPHEN 500 MG PO TABS
1000.0000 mg | ORAL_TABLET | Freq: Four times a day (QID) | ORAL | Status: DC
  Administered 2023-07-27 – 2023-07-29 (×8): 1000 mg via ORAL
  Filled 2023-07-27 (×8): qty 2

## 2023-07-27 MED ORDER — TRANEXAMIC ACID-NACL 1000-0.7 MG/100ML-% IV SOLN
1000.0000 mg | INTRAVENOUS | Status: AC
  Administered 2023-07-27: 1000 mg via INTRAVENOUS

## 2023-07-27 MED ORDER — MISOPROSTOL 200 MCG PO TABS
ORAL_TABLET | ORAL | Status: AC
  Filled 2023-07-27: qty 5

## 2023-07-27 MED ORDER — WITCH HAZEL-GLYCERIN EX PADS: 1.0000 | MEDICATED_PAD | CUTANEOUS | Status: DC | PRN

## 2023-07-27 MED ORDER — SIMETHICONE 80 MG PO CHEW: 80.0000 mg | CHEWABLE_TABLET | ORAL | Status: DC | PRN

## 2023-07-27 MED ORDER — SODIUM CHLORIDE 0.9% FLUSH: 3.0000 mL | INTRAVENOUS | Status: DC | PRN

## 2023-07-27 MED ORDER — ZOLPIDEM TARTRATE 5 MG PO TABS: 5.0000 mg | ORAL_TABLET | Freq: Every evening | ORAL | Status: DC | PRN

## 2023-07-27 MED ORDER — CEFOTETAN DISODIUM 2 G IJ SOLR
INTRAMUSCULAR | Status: AC
  Filled 2023-07-27: qty 2

## 2023-07-27 MED ORDER — FENTANYL CITRATE (PF) 100 MCG/2ML IJ SOLN
INTRAMUSCULAR | Status: DC | PRN
  Administered 2023-07-27: 15 ug via INTRATHECAL

## 2023-07-27 MED ORDER — MORPHINE SULFATE (PF) 0.5 MG/ML IJ SOLN
INTRAMUSCULAR | Status: AC
  Filled 2023-07-27: qty 10

## 2023-07-27 MED ORDER — BUPIVACAINE HCL (PF) 0.25 % IJ SOLN
INTRAMUSCULAR | Status: AC
  Filled 2023-07-27: qty 30

## 2023-07-27 MED ORDER — METOCLOPRAMIDE HCL 5 MG/ML IJ SOLN
INTRAMUSCULAR | Status: AC
  Filled 2023-07-27: qty 2

## 2023-07-27 MED ORDER — SOD CITRATE-CITRIC ACID 500-334 MG/5ML PO SOLN
30.0000 mL | Freq: Once | ORAL | Status: AC
  Administered 2023-07-27: 30 mL via ORAL
  Filled 2023-07-27: qty 30

## 2023-07-27 MED ORDER — IBUPROFEN 600 MG PO TABS
600.0000 mg | ORAL_TABLET | Freq: Four times a day (QID) | ORAL | Status: DC
  Administered 2023-07-28 – 2023-07-29 (×4): 600 mg via ORAL
  Filled 2023-07-27 (×4): qty 1

## 2023-07-27 MED ORDER — FENTANYL CITRATE (PF) 100 MCG/2ML IJ SOLN: 25.0000 ug | INTRAMUSCULAR | Status: DC | PRN

## 2023-07-27 MED ORDER — ACETAMINOPHEN 500 MG PO TABS: 1000.0000 mg | ORAL_TABLET | Freq: Four times a day (QID) | ORAL | Status: DC

## 2023-07-27 MED ORDER — KETOROLAC TROMETHAMINE 30 MG/ML IJ SOLN: 30.0000 mg | Freq: Four times a day (QID) | INTRAMUSCULAR | Status: DC | PRN

## 2023-07-27 MED ORDER — PHENYLEPHRINE HCL-NACL 20-0.9 MG/250ML-% IV SOLN
INTRAVENOUS | Status: DC | PRN
  Administered 2023-07-27: 30 ug/min via INTRAVENOUS

## 2023-07-27 MED ORDER — FERROUS SULFATE 325 (65 FE) MG PO TABS
325.0000 mg | ORAL_TABLET | ORAL | Status: DC
  Administered 2023-07-28: 325 mg via ORAL
  Filled 2023-07-27: qty 1

## 2023-07-27 MED ORDER — KETOROLAC TROMETHAMINE 30 MG/ML IJ SOLN
30.0000 mg | Freq: Four times a day (QID) | INTRAMUSCULAR | Status: AC
  Administered 2023-07-27 – 2023-07-28 (×4): 30 mg via INTRAVENOUS
  Filled 2023-07-27 (×4): qty 1

## 2023-07-27 MED ORDER — PHENYLEPHRINE 80 MCG/ML (10ML) SYRINGE FOR IV PUSH (FOR BLOOD PRESSURE SUPPORT)
PREFILLED_SYRINGE | INTRAVENOUS | Status: AC
Start: 2023-07-27 — End: ?
  Filled 2023-07-27: qty 10

## 2023-07-27 MED ORDER — MAGNESIUM HYDROXIDE 400 MG/5ML PO SUSP: 30.0000 mL | ORAL | Status: DC | PRN

## 2023-07-27 MED ORDER — MISOPROSTOL 200 MCG PO TABS
1000.0000 ug | ORAL_TABLET | Freq: Once | ORAL | Status: AC
  Administered 2023-07-27: 1000 ug via RECTAL

## 2023-07-27 MED ORDER — DIBUCAINE (PERIANAL) 1 % EX OINT: 1.0000 | TOPICAL_OINTMENT | CUTANEOUS | Status: DC | PRN

## 2023-07-27 MED ORDER — BUPIVACAINE IN DEXTROSE 0.75-8.25 % IT SOLN
INTRATHECAL | Status: DC | PRN
  Administered 2023-07-27: 1.6 mL via INTRATHECAL

## 2023-07-27 MED ORDER — LACTATED RINGERS IV SOLN
INTRAVENOUS | Status: DC | PRN
Start: 2023-07-27 — End: 2023-07-27

## 2023-07-27 MED ORDER — COCONUT OIL OIL: 1.0000 | TOPICAL_OIL | Status: DC | PRN

## 2023-07-27 MED ORDER — ONDANSETRON HCL 4 MG/2ML IJ SOLN
INTRAMUSCULAR | Status: AC
Start: 2023-07-27 — End: ?
  Filled 2023-07-27: qty 2

## 2023-07-27 MED ORDER — NALOXONE HCL 0.4 MG/ML IJ SOLN: 0.4000 mg | INTRAMUSCULAR | Status: DC | PRN

## 2023-07-27 MED ORDER — OXYTOCIN-SODIUM CHLORIDE 30-0.9 UT/500ML-% IV SOLN: 2.5000 [IU]/h | INTRAVENOUS | Status: AC

## 2023-07-27 MED ORDER — DEXAMETHASONE SODIUM PHOSPHATE 10 MG/ML IJ SOLN
INTRAMUSCULAR | Status: DC | PRN
  Administered 2023-07-27: 10 mg via INTRAVENOUS

## 2023-07-27 MED ORDER — GABAPENTIN 300 MG PO CAPS
300.0000 mg | ORAL_CAPSULE | Freq: Two times a day (BID) | ORAL | Status: DC
  Administered 2023-07-27 – 2023-07-29 (×5): 300 mg via ORAL
  Filled 2023-07-27 (×5): qty 1

## 2023-07-27 MED ORDER — OXYCODONE HCL 5 MG PO TABS: 5.0000 mg | ORAL_TABLET | ORAL | Status: DC | PRN

## 2023-07-27 MED ORDER — SODIUM CHLORIDE 0.9 % IV SOLN
2.0000 g | INTRAVENOUS | Status: AC
  Administered 2023-07-27: 2 g via INTRAVENOUS
  Filled 2023-07-27: qty 2

## 2023-07-27 MED ORDER — OXYTOCIN-SODIUM CHLORIDE 30-0.9 UT/500ML-% IV SOLN
INTRAVENOUS | Status: DC | PRN
  Administered 2023-07-27: 300 mL via INTRAVENOUS

## 2023-07-27 MED ORDER — SENNOSIDES-DOCUSATE SODIUM 8.6-50 MG PO TABS
2.0000 | ORAL_TABLET | Freq: Every day | ORAL | Status: DC
Start: 2023-07-28 — End: 2023-07-29
  Administered 2023-07-28 – 2023-07-29 (×2): 2 via ORAL
  Filled 2023-07-27 (×2): qty 2

## 2023-07-27 MED ORDER — KETOROLAC TROMETHAMINE 30 MG/ML IJ SOLN: 30.0000 mg | Freq: Once | INTRAMUSCULAR | Status: DC | PRN

## 2023-07-27 MED ORDER — HYDROMORPHONE HCL 2 MG PO TABS: 2.0000 mg | ORAL_TABLET | ORAL | Status: DC | PRN

## 2023-07-27 MED ORDER — MEASLES, MUMPS & RUBELLA VAC IJ SOLR
0.5000 mL | Freq: Once | INTRAMUSCULAR | Status: AC
  Administered 2023-07-29: 0.5 mL via SUBCUTANEOUS
  Filled 2023-07-27: qty 0.5

## 2023-07-27 MED ORDER — TRANEXAMIC ACID-NACL 1000-0.7 MG/100ML-% IV SOLN
INTRAVENOUS | Status: AC
  Filled 2023-07-27: qty 100

## 2023-07-27 MED ORDER — MENTHOL 3 MG MT LOZG: 1.0000 | LOZENGE | OROMUCOSAL | Status: DC | PRN

## 2023-07-27 MED ORDER — MORPHINE SULFATE (PF) 0.5 MG/ML IJ SOLN
INTRAMUSCULAR | Status: DC | PRN
  Administered 2023-07-27: 150 ug via INTRATHECAL

## 2023-07-27 MED ORDER — TETANUS-DIPHTH-ACELL PERTUSSIS 5-2.5-18.5 LF-MCG/0.5 IM SUSY: 0.5000 mL | PREFILLED_SYRINGE | Freq: Once | INTRAMUSCULAR | Status: DC

## 2023-07-27 MED ORDER — STERILE WATER FOR IRRIGATION IR SOLN
Status: DC | PRN
  Administered 2023-07-27: 1000 mL

## 2023-07-27 MED ORDER — FENTANYL CITRATE (PF) 250 MCG/5ML IJ SOLN
INTRAMUSCULAR | Status: AC
Start: 2023-07-27 — End: ?
  Filled 2023-07-27: qty 5

## 2023-07-27 MED ORDER — SODIUM CHLORIDE 0.9 % IR SOLN
Status: DC | PRN
  Administered 2023-07-27: 1000 mL

## 2023-07-27 MED ORDER — PRENATAL MULTIVITAMIN CH
1.0000 | ORAL_TABLET | Freq: Every day | ORAL | Status: DC
  Administered 2023-07-27 – 2023-07-29 (×3): 1 via ORAL
  Filled 2023-07-27 (×3): qty 1

## 2023-07-27 MED ORDER — ONDANSETRON HCL 4 MG/2ML IJ SOLN
INTRAMUSCULAR | Status: DC | PRN
  Administered 2023-07-27: 4 mg via INTRAVENOUS

## 2023-07-27 MED ORDER — ENOXAPARIN SODIUM 80 MG/0.8ML IJ SOSY
70.0000 mg | PREFILLED_SYRINGE | INTRAMUSCULAR | Status: DC
  Administered 2023-07-27 – 2023-07-28 (×2): 70 mg via SUBCUTANEOUS
  Filled 2023-07-27 (×2): qty 0.8

## 2023-07-27 MED ORDER — DIPHENHYDRAMINE HCL 25 MG PO CAPS: 25.0000 mg | ORAL_CAPSULE | Freq: Four times a day (QID) | ORAL | Status: DC | PRN

## 2023-07-27 MED ORDER — DIPHENHYDRAMINE HCL 50 MG/ML IJ SOLN: 12.5000 mg | Freq: Four times a day (QID) | INTRAMUSCULAR | Status: DC | PRN

## 2023-07-27 MED ORDER — PHENYLEPHRINE 80 MCG/ML (10ML) SYRINGE FOR IV PUSH (FOR BLOOD PRESSURE SUPPORT): PREFILLED_SYRINGE | INTRAVENOUS | Status: DC | PRN

## 2023-07-27 MED ORDER — ONDANSETRON HCL 4 MG/2ML IJ SOLN
4.0000 mg | Freq: Three times a day (TID) | INTRAMUSCULAR | Status: DC | PRN
  Administered 2023-07-27: 4 mg via INTRAVENOUS
  Filled 2023-07-27: qty 2

## 2023-07-27 MED ORDER — LACTATED RINGERS IV SOLN: INTRAVENOUS | Status: DC

## 2023-07-27 MED ORDER — ACETAMINOPHEN 10 MG/ML IV SOLN
INTRAVENOUS | Status: DC | PRN
  Administered 2023-07-27: 1000 mg via INTRAVENOUS

## 2023-07-27 MED ORDER — BUPIVACAINE HCL 0.25 % IJ SOLN
INTRAMUSCULAR | Status: DC | PRN
  Administered 2023-07-27: 30 mL

## 2023-07-27 SURGICAL SUPPLY — 9 items
BENZOIN TINCTURE PRP APPL 2/3 (GAUZE/BANDAGES/DRESSINGS) IMPLANT
CANISTER WOUNDNEG PRESSURE 500 (CANNISTER) IMPLANT
DRESSING PREVENA PLUS CUSTOM (GAUZE/BANDAGES/DRESSINGS) IMPLANT
DRSG OPSITE POSTOP 4X10 (GAUZE/BANDAGES/DRESSINGS) IMPLANT
MAT PREVALON FULL STRYKER (MISCELLANEOUS) IMPLANT
PREVENA RESTOR AXIOFORM 29X28 (GAUZE/BANDAGES/DRESSINGS) IMPLANT
RETRACTOR TRAXI PANNICULUS (MISCELLANEOUS) IMPLANT
STRIP CLOSURE SKIN 1/2X4 (GAUZE/BANDAGES/DRESSINGS) IMPLANT
SUT MON AB 2-0 SH27 (SUTURE) IMPLANT

## 2023-07-27 NOTE — Progress Notes (Signed)
 FACULTY PRACTICE ANTEPARTUM PROGRESS NOTE  Tara Woodward is a 33 y.o. G2P1002 at [redacted]w[redacted]d who is admitted for mild preeclampsia, FGR and abnormal testing with intermittent REDF..  Estimated Date of Delivery: 09/28/23 Fetal presentation is breech.  Length of Stay:  3 Days. Admitted 07/24/2023  Subjective: Pt comfortable.  She denies headache, visual changes and RUQ pain. Patient reports normal fetal movement.  She denies uterine contractions, denies bleeding and leaking of fluid per vagina.  Vitals:  Blood pressure 121/75, pulse 80, temperature 98.1 F (36.7 C), temperature source Oral, resp. rate 19, height 5\' 5"  (1.651 m), weight (!) 143.6 kg, SpO2 99%. Physical Examination: CONSTITUTIONAL: obese, Well-developed, well-nourished female in no acute distress.  HENT:  Normocephalic, atraumatic, External right and left ear normal. Oropharynx is clear and moist EYES: Conjunctivae and EOM are normal.  NECK: Normal range of motion, supple, no masses. SKIN: Skin is warm and dry. No rash noted. Not diaphoretic. No erythema. No pallor. NEUROLGIC: Alert and oriented to person, place, and time. Normal reflexes, muscle tone coordination. No cranial nerve deficit noted. PSYCHIATRIC: Normal mood and affect. Normal behavior. Normal judgment and thought content. CARDIOVASCULAR: Normal heart rate noted, regular rhythm RESPIRATORY: Effort and breath sounds normal, no problems with respiration noted MUSCULOSKELETAL: Normal range of motion. No edema and no tenderness. ABDOMEN: Soft, nontender, nondistended, gravid. CERVIX: deferred  Fetal monitoring: FHR: 130s bpm, Variability: moderate, Accelerations: Present, Decelerations: none Uterine activity: none   Results for orders placed or performed during the hospital encounter of 07/24/23 (from the past 48 hours)  Type and screen [facility]     Status: None   Collection Time: 07/27/23  4:35 AM  Result Value Ref Range   ABO/RH(D) A POS     Antibody Screen NEG    Sample Expiration      07/30/2023,2359 Performed at [facility], [address] [place], [place] [address]   Comprehensive metabolic panel     Status: Abnormal   Collection Time: 07/27/23  4:38 AM  Result Value Ref Range   Sodium 134 (L) 135 - 145 mmol/L   Potassium 4.5 3.5 - 5.1 mmol/L   Chloride 102 98 - 111 mmol/L   CO2 24 22 - 32 mmol/L   Glucose, Bld 79 70 - 99 mg/dL    Comment: Glucose reference range applies only to samples taken after fasting for at least 8 hours.   BUN 11 6 - 20 mg/dL   Creatinine, Ser 8.65 0.44 - 1.00 mg/dL   Calcium  8.0 (L) 8.9 - 10.3 mg/dL   Total Protein 5.9 (L) 6.5 - 8.1 g/dL   Albumin 2.1 (L) 3.5 - 5.0 g/dL   AST 20 15 - 41 U/L   ALT 11 0 - 44 U/L   Alkaline Phosphatase 142 (H) 38 - 126 U/L   Total Bilirubin 0.2 0.0 - 1.2 mg/dL   GFR, Estimated >78 >46 mL/min    Comment: (NOTE) Calculated using the CKD-EPI Creatinine Equation (2021)    Anion gap 8 5 - 15    Comment: Performed at [facility], [address], [place], [place] [address]  CBC     Status: Abnormal   Collection Time: 07/27/23  4:38 AM  Result Value Ref Range   WBC 11.6 (H) 4.0 - 10.5 K/uL   RBC 3.85 (L) 3.87 - 5.11 MIL/uL   Hemoglobin 11.5 (L) 12.0 - 15.0 g/dL   HCT 28.4 (L) 13.2 - 44.0 %   MCV 89.6  80.0 - 100.0 fL   MCH 29.9 26.0 - 34.0 pg   MCHC 33.3 30.0 - 36.0 g/dL   RDW 04.5 40.9 - 81.1 %   Platelets 418 (H) 150 - 400 K/uL   nRBC 0.2 0.0 - 0.2 %    Comment: Performed at [facility], [address], [place], [place] [address]    I have reviewed the patient's current medications.  ASSESSMENT: Principal Problem:   Chronic hypertension in obstetric context in third trimester Fetal growth restriction SIPE without severe features   PLAN: #FGR with intermittent REDF Continue inpatient monitoring until delivery Scheduled for pLTCS due to breech position. She understands we can convert to IOL if position changes.  She would like  BTS at the time of her c-section.  BPP/doppler results with intermittent REDF.  BMZ 5/20-21 Continue Q72 hour labs - normal today Twice daily testing Delivery at 32 weeks pending clinical status She is s/p NICU consult.   #Hypothyroidism - Continue Synthroid . TSH dropped from 39 to 27  #FWB - Reactive NST   Continue routine antenatal care.  Lacey Pian, MD [facility] Faculty Attending, Center for [facility] 07/27/2023 7:36 AM

## 2023-07-27 NOTE — Plan of Care (Signed)
 Care plan

## 2023-07-27 NOTE — Progress Notes (Addendum)
 Called to room for passing clots. Uterus firm and clots expressed, but none felt in LUS. Given rectal cytotec x 1000 mcg. EBL overall about 700 including in the OR.

## 2023-07-27 NOTE — Op Note (Signed)
 Preoperative Diagnosis:  IUP @ [redacted]w[redacted]d, CHTN, FGR with iREDF and Category 2 tracing with spontaneous recurrent decels, morbid obesity, Undesired Fertility  Postoperative Diagnosis:  Same  Procedure: Primary low transverse cesarean section, Bilateral Salpingectomy  Surgeon: Tiffany Foerster, M.D.  Assistant: Alyssa Leveque An experienced assistant was required given the standard of surgical care given the complexity of the case.  This assistant was needed for exposure, dissection, suctioning, retraction, instrument exchange, assisting with delivery with administration of fundal pressure, and for overall help during the procedure.  Anesthesia: spinal with Conard Decent, MD  Findings: Viable female infant, APGAR and weight pending, Normal tubes and ovaries  Estimated blood loss: 1000 cc  Complications: None known  Specimens: Placenta to pathology  Reason for procedure: Briefly, the patient is a 33 y.o. [id number] at [redacted]w[redacted]d with h/o previous C-section who desires permanent sterility.  Patient counseled, r.e. Risks benefits of BTL, including permanency of procedure.  Patient verbalized understanding and desires to proceed   Procedure: Patient is taken to the OR where spinal analgesia was administered. She was then placed in a supine position with left lateral tilt. She received 2 g of Cefotetan and TXA given SCDs were in place. A Foley catheter was placed in the bladder. She was prepped and draped in the usual sterile fashion. A timeout was performed. A knife was then used to make a Pfannenstiel incision. This incision was carried out to underlying fascia which was divided in the midline with the knife. The incision was extended laterally, sharply. The fascia was dissected of the underlying rectus superiorly.  The rectus was divided in the midline.  The peritoneal cavity was entered bluntly.  Alexis retractor was placed inside the incision.  A knife was used to make a low transverse incision on the  uterus. This incision was carried down to the amniotic cavity was entered. Fetus was in breech presentation and was brought up out of the incision without difficulty. Delayed cord clamping x 1 minute. Cord was clamped x 2 and cut. Infant taken to waiting pediatrician.  Cord blood was obtained. Placenta was delivered from the uterus.  Uterus was cleaned with dry lap pads. Uterine incision closed with 0 Monocryl suture in a lrunning fashion. Attention was turned to the pt's left tube which was grasped with a Babcock clamp and followed to its fimbriated end.  Two Kelly clamps used to clamp the mesosalpinx and tube with fimbriated end removed with Metzenbaum scissors. A 2-0 Monocryl on an SH was used in a Heaney type stitch beneath each Kelly clamp for hemostasis. A similar process was carried out on the right to allow for bilateral salpingectomy. Alexis retractor was removed from the abdomen. Peritoneal closure was done with 0 Vicryl suture.   Fascia is closed with 0 Vicryl suture in a running fashion. Subcutaneous tissue infused with 30cc 0.25% Marcaine .  Subcutaneous closure was performed with 0 Plain suture.  Skin closed using 3-0 Vicryl on a Keith needle.  Prevena wound vac applied. All instrument, needle and lap counts were correct x 2.  Patient was awake and taken to PACU stable.  Infant to NICU stable.

## 2023-07-27 NOTE — Anesthesia Postprocedure Evaluation (Signed)
 Anesthesia Post Note  Patient: Tara Woodward  Procedure(s) Performed: CESAREAN SECTION, WITH BILATERAL TUBAL LIGATION (Abdomen)     Anesthesia Type: Spinal Anesthetic complications: no   Patient reports that her legs no longer feel numb. She is able to move them. She has not gotten up to walk yet. She does still have a foley catheter in place. She denies any back pain. Discussed with patient to notify us  if she develops any numbness or weakness in her legs or develops any back pain. The patient expressed understanding.                 Conard Decent

## 2023-07-27 NOTE — Transfer of Care (Signed)
 Immediate Anesthesia Transfer of Care Note  Patient: Tara Woodward  Procedure(s) Performed: CESAREAN SECTION, WITH BILATERAL TUBAL LIGATION (Abdomen)  Patient Location: PACU  Anesthesia Type:Spinal  Level of Consciousness: awake, alert , and oriented  Airway & Oxygen Therapy: Patient Spontanous Breathing  Post-op Assessment: Report given to RN and Post -op Vital signs reviewed and stable  Post vital signs: Reviewed and stable  Last Vitals:  Vitals Value Taken Time  BP 150/94 07/27/23 1107  Temp 36.6 C 07/27/23 1107  Pulse 65 07/27/23 1123  Resp 17 07/27/23 1123  SpO2 94 % 07/27/23 1123  Vitals shown include unfiled device data.  Last Pain:  Vitals:   07/27/23 1107  TempSrc: Oral  PainSc: 0-No pain         Complications: There were no known notable events for this encounter.

## 2023-07-27 NOTE — Anesthesia Procedure Notes (Signed)
 Spinal  Patient location during procedure: OR Start time: 07/27/2023 9:40 AM End time: 07/27/2023 9:45 AM Reason for block: surgical anesthesia Staffing Performed: anesthesiologist  Anesthesiologist: Conard Decent, MD Performed by: Conard Decent, MD Authorized by: Conard Decent, MD   Preanesthetic Checklist Completed: patient identified, IV checked, site marked, risks and benefits discussed, surgical consent, monitors and equipment checked, pre-op evaluation and timeout performed Spinal Block Patient position: sitting Prep: DuraPrep Patient monitoring: blood pressure and continuous pulse ox Approach: midline Location: L3-4 Injection technique: single-shot Needle Needle type: Pencan  Needle gauge: 24 G Needle length: 9 cm Assessment Sensory level: T4 Additional Notes Risks and benefits of neuraxial anesthesia including, but not limited to, infection, bleeding, local anesthetic toxicity, headache, hypotension, back pain, block failure, etc. were discussed with the patient. The patient expressed understanding and consented to the procedure. I confirmed that the patient has no bleeding disorders and is not taking blood thinners. I confirmed the patient's last platelet count with the nurse. Monitors were applied. A time-out was performed immediately prior to the procedure. Sterile technique was used throughout the whole procedure.   1 attempt(s)

## 2023-07-27 NOTE — Progress Notes (Addendum)
   NST remarkable for baseline in 130s, minimal variability, no accels, periodic decles.  Has persisted for around 30 minutes, not responsive to intrauterine neonatal resuscitation maneuvers.  Talked to patient, urgent cesarean delivery recommended.  Patient also explicitly stated that she wanted bilateral tubal sterilization, no matter the outcome.  When asked if she wanted this even if the baby passes away, she said yes.    The risks of cesarean section were discussed with the patient including but were not limited to: bleeding which may require transfusion or reoperation; infection which may require antibiotics; injury to bowel, bladder, ureters or other surrounding organs; injury to the fetus; need for additional procedures including hysterectomy in the event of a life-threatening hemorrhage; formation of adhesions; placental abnormalities with subsequent pregnancies; incisional problems; thromboembolic phenomenon and other postoperative/anesthesia complications.  Patient also desires permanent sterilization.  Other reversible forms of contraception were discussed with patient; she declines all other modalities. This will be done either via bilateral salpingectomy or bilateral application of Filshie clips.  Risks of procedure discussed with patient including but not limited to: risk of regret, permanence of method, bleeding, infection, injury to surrounding organs and need for additional procedures.  Failure risk of about 1% with increased risk of ectopic gestation if pregnancy occurs was also discussed with patient.  Also discussed possibility of post-tubal syndrome with increased pelvic pain or menstrual irregularities. The patient concurred with the proposed plan, giving informed written consent for the procedures.  Patient has been NPO since 0700 she will remain NPO for procedure.. Anesthesia, Neonatology and OR aware. Preoperative prophylactic antibiotics and SCDs ordered on call to the OR.  To OR when  ready.   Lenoard Rad, MD, FACOG Obstetrician & Gynecologist, [facility] for [employer], [facility]

## 2023-07-27 NOTE — Discharge Summary (Addendum)
 Postpartum Discharge Summary       Patient Name: Tara Woodward DOB: [birth year] MRN: [id number]  Date of admission: 07/24/2023 Delivery date:07/27/2023 Delivering provider: Granville Layer Date of discharge: 07/29/2023  Admitting diagnosis: Chronic hypertension in obstetric context in third trimester [O10.913] Pregnancy [Z34.90] Acute blood loss anemia Intrauterine pregnancy: [redacted]w[redacted]d     Secondary diagnosis:  Principal Problem:   Chronic hypertension in obstetric context in third trimester Active Problems:   Pregnancy  Additional problems: SIPE with severe features, breech presentation, undesired fertility    Discharge diagnosis: Preterm Pregnancy Delivered, CHTN with superimposed preeclampsia, and hypothyroidism                                              Post partum procedures:postpartum tubal ligation Augmentation: N/A Complications: None  Hospital course: Onset of Labor With Unplanned C/S   33 y.o. yo [id number] at [redacted]w[redacted]d was admitted in [place] with SIPE with severe features due to FGR and iREDF. Monitored on antepartum and s/p BMZ x 2. Fetal monitoring became category 2 with persistent spontaneous decels and breech and was taken for C-section on 07/24/2023.  The patient went for cesarean section due to Malpresentation and Non-Reassuring FHR. Delivery details as follows: Membrane Rupture Time/Date: 10:06 AM,07/27/2023  Delivery Method:C-Section, Low Transverse Operative Delivery:N/A Details of operation can be found in separate operative note. Has prevena wound vac in place. Patient had a postpartum course complicated by Magnesium  x 24 hours. Placed on Lasix  + Procardia  and Lisinopril . Her Labetalol  was continued, but will be stopped.  She is ambulating,tolerating a regular diet, passing flatus, and urinating well.  Patient is discharged home in stable condition 07/29/23.  Newborn Data: Birth date:07/27/2023 Birth time:10:07 AM Gender:Female Living status:Living Apgars:5 ,8   Weight:1220 g  Magnesium  Sulfate received: Yes: Seizure prophylaxis BMZ received: Yes Rhophylac:N/A MMR:Yes T-DaP:Given prenatally Flu: N/A RSV Vaccine received: No Transfusion:No  Immunizations received: Immunization History  Administered Date(s) Administered   Influenza Whole 12/06/2012   Influenza,inj,Quad PF,6+ Mos 03/15/2021   MMR 07/29/2023   Moderna Sars-Covid-2 Vaccination 07/29/2019, 09/04/2019   Tdap 08/02/2021, 07/10/2023    Physical exam  Vitals:   07/28/23 1932 07/28/23 2217 07/29/23 0458 07/29/23 0831  BP: (!) 99/56 (!) 102/57 119/64 127/62  Pulse: 83 79 85 94  Resp: 18 18 (P) 19 17  Temp: 97.6 F (36.4 C) 98 F (36.7 C) (P) 98.3 F (36.8 C) 98.2 F (36.8 C)  TempSrc: Oral Oral (P) Oral Oral  SpO2: 98% 98% (P) 100% 99%  Weight:      Height:       General: alert, cooperative, and no distress Lochia: appropriate Uterine Fundus: firm Incision: Healing well with no significant drainage DVT Evaluation: No evidence of DVT seen on physical exam. Labs: Lab Results  Component Value Date   WBC 17.5 (H) 07/28/2023   HGB 9.7 (L) 07/28/2023   HCT 28.4 (L) 07/28/2023   MCV 89.6 07/28/2023   PLT 430 (H) 07/28/2023      Latest Ref Rng & Units 07/28/2023    5:06 AM  CMP  Glucose 70 - 99 mg/dL 71   BUN 6 - 20 mg/dL 17   Creatinine 1.47 - 1.00 mg/dL 8.29   Sodium 562 - 130 mmol/L 135   Potassium 3.5 - 5.1 mmol/L 4.4   Chloride 98 - 111 mmol/L 101  CO2 22 - 32 mmol/L 23   Calcium  8.9 - 10.3 mg/dL 7.9   Total Protein 6.5 - 8.1 g/dL 5.0   Total Bilirubin 0.0 - 1.2 mg/dL 0.2   Alkaline Phos 38 - 126 U/L 124   AST 15 - 41 U/L 22   ALT 0 - 44 U/L 14    Edinburgh Score:    07/28/2023    9:30 AM  Edinburgh Postnatal Depression Scale Screening Tool  I have been able to laugh and see the funny side of things. 0  I have looked forward with enjoyment to things. 0  I have blamed myself unnecessarily when things went wrong. 2  I have been anxious or worried  for no good reason. 0  I have felt scared or panicky for no good reason. 1  Things have been getting on top of me. 0  I have been so unhappy that I have had difficulty sleeping. 0  I have felt sad or miserable. 0  I have been so unhappy that I have been crying. 0  The thought of harming myself has occurred to me. 0  Edinburgh Postnatal Depression Scale Total 3   Edinburgh Postnatal Depression Scale Total: 3   After visit meds:  Allergies as of 07/29/2023   No Known Allergies      Medication List     STOP taking these medications    labetalol  200 MG tablet Commonly known as: NORMODYNE        TAKE these medications    aspirin  EC 81 MG tablet Take 2 tablets (162 mg total) by mouth daily.   ferrous sulfate  325 (65 FE) MG tablet Take 1 tablet (325 mg total) by mouth every other day. Start taking on: Jul 30, 2023   furosemide  40 MG tablet Commonly known as: LASIX  Take 1 tablet (40 mg total) by mouth daily for 5 days. Start taking on: Jul 30, 2023   hydrochlorothiazide  25 MG tablet Commonly known as: HYDRODIURIL  Take 1 tablet (25 mg total) by mouth daily. Begin after finishing the furosemide  on 08/05/2023   ibuprofen  600 MG tablet Commonly known as: ADVIL  Take 1 tablet (600 mg total) by mouth every 6 (six) hours.   levothyroxine  125 MCG tablet Commonly known as: SYNTHROID  Take 2 tablets (250 mcg total) by mouth daily at 6 (six) AM.   lisinopril  20 MG tablet Commonly known as: ZESTRIL  Take 1 tablet (20 mg total) by mouth daily. Start taking on: Jul 30, 2023   NIFEdipine  60 MG 24 hr tablet Commonly known as: ADALAT  CC Take 1 tablet (60 mg total) by mouth 2 (two) times daily.   oxyCODONE  5 MG immediate release tablet Commonly known as: Oxy IR/ROXICODONE  Take 1-2 tablets (5-10 mg total) by mouth every 4 (four) hours as needed for moderate pain (pain score 4-6).   potassium chloride  SA 20 MEQ tablet Commonly known as: KLOR-CON  M Take 1 tablet (20 mEq total) by  mouth daily.   PRENATAL PO Take by mouth.         Discharge home in stable condition Infant Feeding: Bottle and Breast Infant Disposition:NICU Discharge instruction: per After Visit Summary and Postpartum booklet. Activity: Advance as tolerated. Pelvic rest for 6 weeks.  Diet: routine diet and low salt diet Future Appointments:No future appointments. Follow up Visit:  Follow-up Information     [facility] for [facility] Follow up.   Specialty: Obstetrics and Gynecology Contact information: [address] [place]   [address] [phone]                Msg sent by Natale Bail on 07/27/23 Please schedule this patient for a In person postpartum visit in 4 weeks with the following provider: MD. Additional Postpartum F/U:Incision check 1 week and BP check 1 week  High risk pregnancy complicated by: HTN Delivery mode:  C-Section, Low Transverse Anticipated Birth Control:  BTL done [facility]   07/29/2023 Granville Layer, MD

## 2023-07-27 NOTE — Anesthesia Postprocedure Evaluation (Signed)
 Anesthesia Post Note  Patient: Tara Woodward  Procedure(s) Performed: CESAREAN SECTION, WITH BILATERAL TUBAL LIGATION (Abdomen)     Patient location during evaluation: PACU Anesthesia Type: Spinal Level of consciousness: awake Pain management: pain level controlled Vital Signs Assessment: post-procedure vital signs reviewed and stable Respiratory status: spontaneous breathing, respiratory function stable and nonlabored ventilation Cardiovascular status: blood pressure returned to baseline and stable Postop Assessment: no headache, no backache, no apparent nausea or vomiting, spinal receding and patient able to bend at knees Anesthetic complications: no Comments: Patient with cHTN and SIPE. She is on scheduled labetalol  and nifedipine . BPs elevated in PACU (155/97). OB did not want to add any additional BP medication at this time. Patient able to move legs. Denies any back pain at spinal site.   There were no known notable events for this encounter.  Last Vitals:  Vitals:   07/27/23 1145 07/27/23 1200  BP: (!) 153/93 (!) 155/97  Pulse: 66 66  Resp: 10 13  Temp: 36.6 C   SpO2: 95% 96%    Last Pain:  Vitals:   07/27/23 1145  TempSrc: Axillary  PainSc:    Pain Goal:    LLE Motor Response: Purposeful movement (07/27/23 1130) LLE Sensation: Numbness (07/27/23 1130) RLE Motor Response: Purposeful movement (07/27/23 1130) RLE Sensation: Numbness (07/27/23 1130)     Epidural/Spinal Function Cutaneous sensation: Able to Wiggle Toes (07/27/23 1130), Patient able to flex knees: Yes (07/27/23 1130), Patient able to lift hips off bed: No (07/27/23 1130), Back pain beyond tenderness at insertion site: No (07/27/23 1130), Progressively worsening motor and/or sensory loss: No (07/27/23 1130), Bowel and/or bladder incontinence post epidural: No (07/27/23 1130)  Conard Decent

## 2023-07-27 NOTE — Anesthesia Preprocedure Evaluation (Addendum)
 Anesthesia Evaluation  Patient identified by MRN, date of birth, ID band Patient awake    Reviewed: Allergy & Precautions, NPO status , Patient's Chart, lab work & pertinent test results  History of Anesthesia Complications Negative for: history of anesthetic complications  Airway Mallampati: III  TM Distance: >3 FB Neck ROM: Full    Dental  (+) Dental Advisory Given   Pulmonary neg pulmonary ROS   Pulmonary exam normal breath sounds clear to auscultation       Cardiovascular hypertension (cHTN with SIPE), Pt. on medications  Rhythm:Regular Rate:Normal  HLD   Neuro/Psych  PSYCHIATRIC DISORDERS  Depression    negative neurological ROS     GI/Hepatic Neg liver ROS,GERD  ,,  Endo/Other  Hypothyroidism  Class 4 obesity  Renal/GU negative Renal ROS     Musculoskeletal   Abdominal  (+) + obese  Peds  Hematology  (+) Blood dyscrasia, anemia Lab Results      Component                Value               Date                      WBC                      11.6 (H)            07/27/2023                HGB                      11.5 (L)            07/27/2023                HCT                      34.5 (L)            07/27/2023                MCV                      89.6                07/27/2023                PLT                      418 (H)             07/27/2023              Anesthesia Other Findings Urgent c-section called for NRFHT.  On heparin 10,000 units SQ BID, last dose 11:54 pm last night.  Ate breakfast ~2 hours ago    Reproductive/Obstetrics (+) Pregnancy                              Anesthesia Physical Anesthesia Plan  ASA: 3  Anesthesia Plan: Spinal   Post-op Pain Management: Ofirmev  IV (intra-op)*   Induction: Intravenous  PONV Risk Score and Plan: 2 and Ondansetron , Dexamethasone  and Treatment may vary due to age or medical condition  Airway Management Planned:  Natural Airway  Additional Equipment:   Intra-op Plan:   Post-operative Plan:   Informed  Consent: I have reviewed the patients History and Physical, chart, labs and discussed the procedure including the risks, benefits and alternatives for the proposed anesthesia with the patient or authorized representative who has indicated his/her understanding and acceptance.       Plan Discussed with: CRNA and Anesthesiologist  Anesthesia Plan Comments: (Discussed with OB. Fetal tracing looking better and time for spinal placement in OR.  I have discussed risks of neuraxial anesthesia including but not limited to infection, bleeding, nerve injury, back pain, headache, seizures, and failure of block. Patient denies bleeding disorders and is not currently anticoagulated. Labs have been reviewed. Risks and benefits discussed. All patient's questions answered.  )         Anesthesia Quick Evaluation

## 2023-07-28 ENCOUNTER — Encounter (HOSPITAL_COMMUNITY): Payer: Self-pay | Admitting: Family Medicine

## 2023-07-28 LAB — COMPREHENSIVE METABOLIC PANEL WITH GFR
ALT: 14 U/L (ref 0–44)
AST: 22 U/L (ref 15–41)
Albumin: 1.8 g/dL — ABNORMAL LOW (ref 3.5–5.0)
Alkaline Phosphatase: 124 U/L (ref 38–126)
Anion gap: 11 (ref 5–15)
BUN: 17 mg/dL (ref 6–20)
CO2: 23 mmol/L (ref 22–32)
Calcium: 7.9 mg/dL — ABNORMAL LOW (ref 8.9–10.3)
Chloride: 101 mmol/L (ref 98–111)
Creatinine, Ser: 0.6 mg/dL (ref 0.44–1.00)
GFR, Estimated: >60 mL/min (ref >60)
Glucose, Bld: 71 mg/dL (ref 70–99)
Potassium: 4.4 mmol/L (ref 3.5–5.1)
Sodium: 135 mmol/L (ref 135–145)
Total Bilirubin: 0.2 mg/dL (ref 0.0–1.2)
Total Protein: 5 g/dL — ABNORMAL LOW (ref 6.5–8.1)

## 2023-07-28 LAB — CBC
HCT: 28.4 % — ABNORMAL LOW (ref 36.0–46.0)
Hemoglobin: 9.7 g/dL — ABNORMAL LOW (ref 12.0–15.0)
MCH: 30.6 pg (ref 26.0–34.0)
MCHC: 34.2 g/dL (ref 30.0–36.0)
MCV: 89.6 fL (ref 80.0–100.0)
Platelets: 430 10*3/uL — ABNORMAL HIGH (ref 150–400)
RBC: 3.17 MIL/uL — ABNORMAL LOW (ref 3.87–5.11)
RDW: 13 % (ref 11.5–15.5)
WBC: 17.5 10*3/uL — ABNORMAL HIGH (ref 4.0–10.5)
nRBC: 0 % (ref 0.0–0.2)

## 2023-07-28 MED ORDER — FUROSEMIDE 40 MG PO TABS
40.0000 mg | ORAL_TABLET | Freq: Every day | ORAL | Status: DC
  Administered 2023-07-28 – 2023-07-29 (×2): 40 mg via ORAL
  Filled 2023-07-28 (×2): qty 1

## 2023-07-28 MED ORDER — LABETALOL HCL 5 MG/ML IV SOLN: 20.0000 mg | INTRAVENOUS | Status: DC | PRN

## 2023-07-28 MED ORDER — LABETALOL HCL 5 MG/ML IV SOLN: 80.0000 mg | INTRAVENOUS | Status: DC | PRN

## 2023-07-28 MED ORDER — LISINOPRIL 10 MG PO TABS
20.0000 mg | ORAL_TABLET | Freq: Every day | ORAL | Status: DC
  Administered 2023-07-28 – 2023-07-29 (×2): 20 mg via ORAL
  Filled 2023-07-28 (×2): qty 2

## 2023-07-28 MED ORDER — LABETALOL HCL 5 MG/ML IV SOLN: 40.0000 mg | INTRAVENOUS | Status: DC | PRN

## 2023-07-28 MED ORDER — HYDRALAZINE HCL 20 MG/ML IJ SOLN: 10.0000 mg | INTRAMUSCULAR | Status: DC | PRN

## 2023-07-28 NOTE — Anesthesia Postprocedure Evaluation (Signed)
 Anesthesia Post Note  Patient: Tara Woodward  Procedure(s) Performed: CESAREAN SECTION, WITH BILATERAL TUBAL LIGATION (Abdomen)     Anesthesia Type: Spinal Anesthetic complications: no     Patient doing well today. She reports she has some incisional pain in her abdomen but denies any back pain. She denies numbness and weakness of her legs. She has been able to get up and walk. Her foley has been removed and she has been able to go to the bathroom with issue.                 Conard Decent

## 2023-07-28 NOTE — Addendum Note (Signed)
 Addendum  created 07/28/23 1233 by Conard Decent, MD   Clinical Note Signed

## 2023-07-28 NOTE — Progress Notes (Signed)
 Subjective: Postpartum Day 1: Cesarean Delivery Patient reports incisional pain and tolerating PO.    Objective: Vital signs in last 24 hours: Temp:  [97.8 F (36.6 C)-98.7 F (37.1 C)] 98.7 F (37.1 C) (05/24 0349) Pulse Rate:  [63-91] 63 (05/24 0349) Resp:  [10-18] 16 (05/24 0349) BP: (123-161)/(77-99) 132/77 (05/24 0349) SpO2:  [93 %-100 %] 97 % (05/24 0349) Vitals:   07/27/23 2123 07/27/23 2125 07/27/23 2313 07/28/23 0349  BP: 123/86 127/85 128/79 132/77  Pulse: 91 90 64 63  Resp:   18 16  Temp:   97.9 F (36.6 C) 98.7 F (37.1 C)  TempSrc:   Oral Oral  SpO2:  99% 96% 97%  Weight:      Height:        Intake/Output Summary (Last 24 hours) at 07/28/2023 0750 Last data filed at 07/28/2023 1610 Gross per 24 hour  Intake 2427.37 ml  Output 2587 ml  Net -159.63 ml   Physical Exam:  General: alert, cooperative, and appears stated age 33: appropriate Uterine Fundus: firm Incision: healing well DVT Evaluation: No evidence of DVT seen on physical exam.  Recent Labs    07/27/23 0438 07/28/23 0506  HGB 11.5* 9.7*  HCT 34.5* 28.4*    Assessment/Plan: Status post Cesarean section. Doing well postoperatively.  Continue current care. Tweak her BP meds Lasix Procardia  Lisinopril   Granville Layer, MD 07/28/2023, 7:49 AM

## 2023-07-28 NOTE — Lactation Note (Signed)
 This note was copied from a baby's chart. Lactation Consultation Note  Patient Name: Tara Woodward [id number] Date: 07/28/2023 Age:35 hours   LC asked RN to inquire whether Mom would like to pump and bottle and have a LC consult.  Mom stated she plans to only formula feed. Lactation services complete.   Dario Edison 07/28/2023, 10:54 AM

## 2023-07-28 NOTE — Lactation Note (Signed)
 This note was copied from a baby's chart. Lactation Consultation Note  Patient Name: Tara Woodward Forner [id number] Date: 07/28/2023 Age:35 hours  Mom chooses to formula feed.   Maternal Data    Feeding    LATCH Score                    Lactation Tools Discussed/Used    Interventions    Discharge    Consult Status Consult Status: Complete    Stoy Fenn G 07/28/2023, 3:42 AM

## 2023-07-28 NOTE — Plan of Care (Signed)
  Problem: Education: Goal: Knowledge of General Education information will improve Description: Including pain rating scale, medication(s)/side effects and non-pharmacologic comfort measures Outcome: Progressing   Problem: Health Behavior/Discharge Planning: Goal: Ability to manage health-related needs will improve Outcome: Progressing   Problem: Clinical Measurements: Goal: Ability to maintain clinical measurements within normal limits will improve Outcome: Progressing Goal: Will remain free from infection Outcome: Progressing Goal: Diagnostic test results will improve Outcome: Progressing Goal: Cardiovascular complication will be avoided Outcome: Progressing   Problem: Activity: Goal: Risk for activity intolerance will decrease Outcome: Progressing   Problem: Nutrition: Goal: Adequate nutrition will be maintained Outcome: Progressing   Problem: Coping: Goal: Level of anxiety will decrease Outcome: Progressing   Problem: Elimination: Goal: Will not experience complications related to bowel motility Outcome: Progressing Goal: Will not experience complications related to urinary retention Outcome: Progressing   Problem: Pain Managment: Goal: General experience of comfort will improve and/or be controlled Outcome: Progressing   Problem: Safety: Goal: Ability to remain free from injury will improve Outcome: Progressing   Problem: Skin Integrity: Goal: Risk for impaired skin integrity will decrease Outcome: Progressing   Problem: Education: Goal: Knowledge of disease or condition will improve Outcome: Progressing Goal: Knowledge of the prescribed therapeutic regimen will improve Outcome: Progressing Goal: Individualized Educational Video(s) Outcome: Progressing   Problem: Clinical Measurements: Goal: Complications related to the disease process, condition or treatment will be avoided or minimized Outcome: Progressing   Problem: Education: Goal: Knowledge of  the prescribed therapeutic regimen will improve Outcome: Progressing Goal: Understanding of sexual limitations or changes related to disease process or condition will improve Outcome: Progressing Goal: Individualized Educational Video(s) Outcome: Progressing   Problem: Self-Concept: Goal: Communication of feelings regarding changes in body function or appearance will improve Outcome: Progressing   Problem: Skin Integrity: Goal: Demonstration of wound healing without infection will improve Outcome: Progressing   Problem: Education: Goal: Knowledge of condition will improve Outcome: Progressing Goal: Individualized Educational Video(s) Outcome: Progressing Goal: Individualized Newborn Educational Video(s) Outcome: Progressing   Problem: Activity: Goal: Will verbalize the importance of balancing activity with adequate rest periods Outcome: Progressing Goal: Ability to tolerate increased activity will improve Outcome: Progressing   Problem: Coping: Goal: Ability to identify and utilize available resources and services will improve Outcome: Progressing   Problem: Life Cycle: Goal: Chance of risk for complications during the postpartum period will decrease Outcome: Progressing   Problem: Role Relationship: Goal: Ability to demonstrate positive interaction with newborn will improve Outcome: Progressing   Problem: Skin Integrity: Goal: Demonstration of wound healing without infection will improve Outcome: Progressing

## 2023-07-29 MED ORDER — FERROUS SULFATE 325 (65 FE) MG PO TABS: 325.0000 mg | ORAL_TABLET | ORAL | 3 refills | Status: DC

## 2023-07-29 MED ORDER — HYDROCHLOROTHIAZIDE 25 MG PO TABS: 25.0000 mg | ORAL_TABLET | Freq: Every day | ORAL | 3 refills | Status: AC

## 2023-07-29 MED ORDER — LISINOPRIL 20 MG PO TABS: 20.0000 mg | ORAL_TABLET | Freq: Every day | ORAL | 1 refills | Status: DC

## 2023-07-29 MED ORDER — OXYCODONE HCL 5 MG PO TABS: 5.0000 mg | ORAL_TABLET | ORAL | 0 refills | Status: DC | PRN

## 2023-07-29 MED ORDER — FUROSEMIDE 40 MG PO TABS: 40.0000 mg | ORAL_TABLET | Freq: Every day | ORAL | 0 refills | Status: DC

## 2023-07-29 MED ORDER — IBUPROFEN 600 MG PO TABS: 600.0000 mg | ORAL_TABLET | Freq: Four times a day (QID) | ORAL | 0 refills | Status: AC

## 2023-07-29 MED ORDER — POTASSIUM CHLORIDE CRYS ER 20 MEQ PO TBCR: 20.0000 meq | EXTENDED_RELEASE_TABLET | Freq: Every day | ORAL | 1 refills | Status: DC

## 2023-07-29 NOTE — Plan of Care (Signed)
  Problem: Clinical Measurements: Goal: Ability to maintain clinical measurements within normal limits will improve Outcome: Progressing Goal: Will remain free from infection Outcome: Progressing Goal: Diagnostic test results will improve Outcome: Progressing Goal: Cardiovascular complication will be avoided Outcome: Progressing   Problem: Pain Managment: Goal: General experience of comfort will improve and/or be controlled Outcome: Progressing   Problem: Safety: Goal: Ability to remain free from injury will improve Outcome: Progressing

## 2023-07-31 ENCOUNTER — Encounter: Admitting: Obstetrics & Gynecology

## 2023-08-01 LAB — SURGICAL PATHOLOGY

## 2023-08-03 ENCOUNTER — Ambulatory Visit

## 2023-08-03 VITALS — BP 131/85 | HR 109 | Wt 303.0 lb

## 2023-08-03 DIAGNOSIS — Z0130 Encounter for examination of blood pressure without abnormal findings: Secondary | ICD-10-CM

## 2023-08-03 DIAGNOSIS — Z98891 History of uterine scar from previous surgery: Secondary | ICD-10-CM

## 2023-08-03 DIAGNOSIS — O119 Pre-existing hypertension with pre-eclampsia, unspecified trimester: Secondary | ICD-10-CM

## 2023-08-03 NOTE — Progress Notes (Signed)
 Subjective:  Tara Woodward is a 33 y.o. female here for BP check and incision check.  Hypertension ROS: taking medications as instructed, no medication side effects noted, no TIA's, no chest pain on exertion, no dyspnea on exertion, and no swelling of ankles.   Pt reports incision healing well  Objective:  LMP  (LMP Unknown)   Appearance alert, well appearing, and in no distress. Incision healing well   Assessment:   Blood Pressure today in office well controlled and improved.  Incision healed nicely, no signs of infection, scar looks good  Plan:  Continue B/P Rx's discussed precautions and to contact office with any concerns. Pt to keep PP Appt. Aaron Aas

## 2023-08-07 ENCOUNTER — Ambulatory Visit

## 2023-08-07 ENCOUNTER — Encounter: Admitting: Obstetrics and Gynecology

## 2023-08-14 ENCOUNTER — Ambulatory Visit

## 2023-08-14 ENCOUNTER — Encounter: Admitting: Family Medicine

## 2023-08-16 DIAGNOSIS — Z419 Encounter for procedure for purposes other than remedying health state, unspecified: Secondary | ICD-10-CM | POA: Diagnosis not present

## 2023-08-21 ENCOUNTER — Ambulatory Visit

## 2023-08-21 ENCOUNTER — Encounter: Admitting: Obstetrics and Gynecology

## 2023-08-28 ENCOUNTER — Encounter: Admitting: Family Medicine

## 2023-08-30 ENCOUNTER — Ambulatory Visit: Admitting: Obstetrics and Gynecology

## 2023-09-04 ENCOUNTER — Encounter: Admitting: Obstetrics & Gynecology

## 2023-09-05 ENCOUNTER — Encounter: Payer: Self-pay | Admitting: Obstetrics and Gynecology

## 2023-09-05 ENCOUNTER — Ambulatory Visit (INDEPENDENT_AMBULATORY_CARE_PROVIDER_SITE_OTHER): Admitting: Obstetrics and Gynecology

## 2023-09-05 DIAGNOSIS — Z9079 Acquired absence of other genital organ(s): Secondary | ICD-10-CM | POA: Diagnosis not present

## 2023-09-05 DIAGNOSIS — O1093 Unspecified pre-existing hypertension complicating the puerperium: Secondary | ICD-10-CM

## 2023-09-05 DIAGNOSIS — Z98891 History of uterine scar from previous surgery: Secondary | ICD-10-CM | POA: Diagnosis not present

## 2023-09-05 DIAGNOSIS — O115 Pre-existing hypertension with pre-eclampsia, complicating the puerperium: Secondary | ICD-10-CM

## 2023-09-05 NOTE — Progress Notes (Signed)
 Post Partum Visit Note  Tara Woodward is a 33 y.o. G66P1103 female who presents for a postpartum visit. She is 5 weeks postpartum following a primary cesarean section /Bilateral Salpingectomy .  I have fully reviewed the prenatal and intrapartum course. The delivery was at 31 gestational weeks.  Anesthesia: spinal. Postpartum course has been uncomplicated. Baby is doing well. Baby is feeding by feeding tube. Bleeding no bleeding. Bowel function is normal. Bladder function is normal. Patient is not sexually active.  Postpartum depression screening:  Notes more anxiety than depression baby still in NICU .   The pregnancy intention screening data noted above was reviewed. Potential methods of contraception were discussed. The patient elected to proceed with BTL with C/S.  Edinburgh Postnatal Depression Scale - 09/05/23 1602       Edinburgh Postnatal Depression Scale:  In the Past 7 Days   I have been able to laugh and see the funny side of things. 0    I have looked forward with enjoyment to things. 0    I have blamed myself unnecessarily when things went wrong. 2    I have been anxious or worried for no good reason. 2    I have felt scared or panicky for no good reason. 0    Things have been getting on top of me. 3    I have been so unhappy that I have had difficulty sleeping. 2    I have felt sad or miserable. 1    I have been so unhappy that I have been crying. 1    The thought of harming myself has occurred to me. 0    Edinburgh Postnatal Depression Scale Total 11          Health Maintenance Due  Topic Date Due   Hepatitis B Vaccines (1 of 3 - 19+ 3-dose series) Never done   HPV VACCINES (1 - 3-dose SCDM series) Never done    The following portions of the patient's history were reviewed and updated as appropriate: allergies, current medications, past family history, past medical history, past social history, past surgical history, and problem list.  Review of  Systems Constitutional: negative Eyes: negative Ears, nose, mouth, throat, and face: negative Respiratory: negative Cardiovascular: negative Gastrointestinal: negative Genitourinary:negative Integument/breast: negative Hematologic/lymphatic: negative Musculoskeletal:negative Neurological: negative Behavioral/Psych: positive for anxiety Endocrine: negative Allergic/Immunologic: negative  Objective:  BP 116/81   Pulse 80   Wt (!) 304 lb (137.9 kg)   LMP  (LMP Unknown)   Breastfeeding No Comment: feeding tube in NICU  BMI 50.59 kg/m    General:  alert, cooperative, and morbidly obese   Breasts:  not indicated  Lungs: Normal effort  Heart:  Normal rate  Abdomen: soft, non-tender; bowel sounds normal; no masses,  no organomegaly   Wound well approximated incision  GU exam:  not indicated       Assessment:  1. Encounter for postpartum visit (Primary) - Normal postpartum exam.   2. Pre-eclampsia superimposed on chronic hypertension, postpartum - BP stable  3. Status post cesarean delivery - Incision well healed  4. Status post bilateral salpingectomy   Plan:   Essential components of care per ACOG recommendations:  1.  Mood and well being: Patient with positive depression screening today. Reviewed local resources for support.  - Patient tobacco use? No.   - hx of drug use? No.    2. Infant care and feeding:  -Patient currently breastmilk feeding? No.  -Social determinants of health (  SDOH) reviewed in EPIC. No concerns  3. Sexuality, contraception and birth spacing - Patient does not want a pregnancy in the next year.  Desired family size is 3 children.  - Reviewed reproductive life planning. Reviewed contraceptive methods based on pt preferences and effectiveness.  Patient desired Female Sterilization today.   - Discussed birth spacing of 18 months  4. Sleep and fatigue -Encouraged family/partner/community support of 4 hrs of uninterrupted sleep to help with  mood and fatigue  5. Physical Recovery  - Discussed patients delivery and complications. She describes her labor as good. - Patient had a C-section repeat; no problems after deliver.  - Patient has urinary incontinence? No. - Patient is safe to resume physical and sexual activity  6.  Health Maintenance - HM due items addressed Yes - Last pap smear  Diagnosis  Date Value Ref Range Status  06/15/2021   Final   - Negative for intraepithelial lesion or malignancy (NILM)   Pap smear not done at today's visit.  -Breast Cancer screening indicated? No.   7. Chronic Disease/Pregnancy Condition follow up: Hypertension  - PCP follow up  Ala Cart, CNM [facility] [employer]

## 2023-09-06 ENCOUNTER — Ambulatory Visit: Admitting: Cardiology

## 2023-09-08 ENCOUNTER — Encounter: Payer: Self-pay | Admitting: Obstetrics and Gynecology

## 2023-09-08 DIAGNOSIS — Z9079 Acquired absence of other genital organ(s): Secondary | ICD-10-CM | POA: Insufficient documentation

## 2023-09-08 HISTORY — DX: Acquired absence of other genital organ(s): Z90.79

## 2023-09-11 ENCOUNTER — Encounter: Admitting: Obstetrics and Gynecology

## 2023-09-15 DIAGNOSIS — Z419 Encounter for procedure for purposes other than remedying health state, unspecified: Secondary | ICD-10-CM | POA: Diagnosis not present

## 2023-09-17 ENCOUNTER — Telehealth: Payer: Self-pay

## 2023-09-17 NOTE — Telephone Encounter (Signed)
 TC from patient requesting to return to work early . Pt had Normal PP exam with no restrictions Letter will be provided

## 2023-09-17 NOTE — Telephone Encounter (Signed)
 Pt called in asking for a return to work note. Reviewed chart with Andrez and last office visit notes. Provided pt w/ letter.

## 2023-09-18 ENCOUNTER — Encounter: Admitting: Obstetrics & Gynecology

## 2023-10-16 DIAGNOSIS — Z419 Encounter for procedure for purposes other than remedying health state, unspecified: Secondary | ICD-10-CM | POA: Diagnosis not present

## 2023-11-16 DIAGNOSIS — Z419 Encounter for procedure for purposes other than remedying health state, unspecified: Secondary | ICD-10-CM | POA: Diagnosis not present

## 2024-01-17 ENCOUNTER — Ambulatory Visit: Payer: Self-pay

## 2024-01-17 NOTE — Telephone Encounter (Signed)
 FYI Only or Action Required?: FYI only for provider: appointment scheduled on 01/18/24.  Patient was last seen in primary care on 05/22/2023 by Carin Gauze, NP.  Called Nurse Triage reporting Hypertension.  Symptoms began several months ago.  Interventions attempted: Rest, hydration, or home remedies.  Symptoms are: unchanged.  Triage Disposition: See Physician Within 24 Hours  Patient/caregiver understands and will follow disposition?: Yes         Copied from CRM #[id number]. Topic: Clinical - Red Word Triage >> Jan 17, 2024  1:09 PM Delon HERO wrote: Red Word that prompted transfer to Nurse Triage: Patient is calling to report elevated BP - have not taken readings. Dizziness, feeling about to pass out. And anxious. Last office visit 2024. [facility] Reason for Disposition  [1] MODERATE dizziness (e.g., interferes with normal activities) AND [2] has NOT been evaluated by doctor (or NP/PA) for this  (Exception: Dizziness caused by heat exposure, sudden standing, or poor fluid intake.)  Answer Assessment - Initial Assessment Questions 1. BLOOD PRESSURE: What is your blood pressure? Did you take at least two measurements 5 minutes apart?     Does not have BP machine 2. ONSET: When did you take your blood pressure?     Sx have been present for a few months 3. HOW: How did you take your blood pressure? (e.g., automatic home BP monitor, visiting nurse)     N/a 4. HISTORY: Do you have a history of high blood pressure?     Endorses HTN - pre-eclampsia  5. MEDICINES: Are you taking any medicines for blood pressure? Have you missed any doses recently?     Has not been on any HTN meds 6. OTHER SYMPTOMS: Do you have any symptoms? (e.g., blurred vision, chest pain, difficulty breathing, headache, weakness)     Dizziness, out of it, sometimes feeling like passing out 7. PREGNANCY: Is there any chance you are pregnant? When was your last menstrual period?     Recently  had a baby  Answer Assessment - Initial Assessment Questions 1. DESCRIPTION: Describe your dizziness.     Feeling like I'm going to faint 2. LIGHTHEADED: Do you feel lightheaded? (e.g., somewhat faint, woozy, weak upon standing)     yes 3. VERTIGO: Do you feel like either you or the room is spinning or tilting? (i.e., vertigo)     *No Answer* 4. SEVERITY: How bad is it?  Do you feel like you are going to faint? Can you stand and walk?     *No Answer* 5. ONSET:  When did the dizziness begin?     See alternate 6. AGGRAVATING FACTORS: Does anything make it worse? (e.g., standing, change in head position)     denies 7. HEART RATE: Can you tell me your heart rate? How many beats in 15 seconds?  (Note: Not all patients can do this.)       N/a 8. CAUSE: What do you think is causing the dizziness? (e.g., decreased fluids or food, diarrhea, emotional distress, heat exposure, new medicine, sudden standing, vomiting; unknown)     Thinks HTN 9. RECURRENT SYMPTOM: Have you had dizziness before? If Yes, ask: When was the last time? What happened that time?     Yes, a long time ago... when I was on my blood pressure pill. I would take a BP pill and then feel better. 10. OTHER SYMPTOMS: Do you have any other symptoms? (e.g., fever, chest pain, vomiting, diarrhea, bleeding)       yes 11. PREGNANCY:  Is there any chance you are pregnant? When was your last menstrual period?       *No Answer*  Protocols used: Blood Pressure - High-A-AH, Dizziness - Lightheadedness-A-AH

## 2024-01-17 NOTE — Telephone Encounter (Signed)
 Patient is no longer a patient here.  Her PCP is at [facility]

## 2024-01-18 ENCOUNTER — Ambulatory Visit: Admitting: Internal Medicine

## 2024-01-23 ENCOUNTER — Ambulatory Visit: Admitting: Cardiology

## 2024-01-29 ENCOUNTER — Ambulatory Visit: Admitting: Cardiology

## 2024-02-05 ENCOUNTER — Ambulatory Visit: Admitting: Cardiology

## 2024-02-05 ENCOUNTER — Encounter: Payer: Self-pay | Admitting: Cardiology

## 2024-02-05 VITALS — BP 168/114 | HR 74 | Ht 65.0 in | Wt 294.0 lb

## 2024-02-05 DIAGNOSIS — E039 Hypothyroidism, unspecified: Secondary | ICD-10-CM | POA: Diagnosis not present

## 2024-02-05 DIAGNOSIS — E782 Mixed hyperlipidemia: Secondary | ICD-10-CM

## 2024-02-05 DIAGNOSIS — Z131 Encounter for screening for diabetes mellitus: Secondary | ICD-10-CM

## 2024-02-05 DIAGNOSIS — I10 Essential (primary) hypertension: Secondary | ICD-10-CM | POA: Diagnosis not present

## 2024-02-05 DIAGNOSIS — F419 Anxiety disorder, unspecified: Secondary | ICD-10-CM | POA: Insufficient documentation

## 2024-02-05 DIAGNOSIS — Z6841 Body Mass Index (BMI) 40.0 and over, adult: Secondary | ICD-10-CM

## 2024-02-05 DIAGNOSIS — E66813 Obesity, class 3: Secondary | ICD-10-CM

## 2024-02-05 MED ORDER — HYDROXYZINE HCL 10 MG PO TABS: 10.0000 mg | ORAL_TABLET | Freq: Three times a day (TID) | ORAL | 0 refills | Status: AC | PRN

## 2024-02-05 MED ORDER — LISINOPRIL 5 MG PO TABS: 20.0000 mg | ORAL_TABLET | Freq: Every day | ORAL | 0 refills | Status: AC

## 2024-02-05 MED ORDER — LEVOTHYROXINE SODIUM 125 MCG PO TABS: 250.0000 ug | ORAL_TABLET | Freq: Every day | ORAL | 1 refills | Status: AC

## 2024-02-05 NOTE — Progress Notes (Signed)
 Established Patient Office Visit  Subjective:  Patient ID: Tara Woodward, female    DOB: 07-Apr-1990  Age: 33 y.o. MRN: [id number]  Chief Complaint  Patient presents with   Acute Visit    Blood Pressure Elevated    Patient in office for an acute visit, reports blood pressure has been elevated. Patient blood pressure elevated in office today. Will restart lisinopril  at 5 mg daily. Will reassess in one month.  Patient fasting, will get lab work today.  Patient delivered a baby boy in 07/2023 at 31 weeks via emergency c-section, baby spent weeks in the NICU. Since then, patient has been experiencing anxiety, flash backs, regarding C-section and complications from delivery. Will send in hydroxyzine . Will send referral to psychiatry.      No other concerns at this time.   Past Medical History:  Diagnosis Date   Depression    Genital warts 06/15/2021   HTN (hypertension)    Hypothyroidism    Major depression, recurrent 11/08/2016   Morbid obesity with BMI of 45.0-49.9, adult (HCC)    Rubella non-immune status, antepartum 06/08/2023   Status post bilateral salpingectomy 09/08/2023    Past Surgical History:  Procedure Laterality Date   CESAREAN SECTION WITH BILATERAL TUBAL LIGATION N/A 07/27/2023   Procedure: CESAREAN SECTION, WITH BILATERAL TUBAL LIGATION;  Surgeon: Fredirick Glenys RAMAN, MD;  Location: [facility] OR;  Service: Obstetrics;  Laterality: N/A;   NO PAST SURGERIES      Social History   Socioeconomic History   Marital status: Married    Spouse name: Christopher   Number of children: 2   Years of education: Not on file   Highest education level: High school graduate  Occupational History   Occupation: statistician  Tobacco Use   Smoking status: Never   Smokeless tobacco: Never  Vaping Use   Vaping status: Never Used  Substance and Sexual Activity   Alcohol use: Not Currently   Drug use: No   Sexual activity: Yes    Partners: Male    Birth control/protection: Pill  Other Topics  Concern   Not on file  Social History Narrative   Not on file   Social Drivers of Health   Financial Resource Strain: Not on file  Food Insecurity: No Food Insecurity (07/24/2023)   Hunger Vital Sign    Worried About Running Out of Food in the Last Year: Never true    Ran Out of Food in the Last Year: Never true  Transportation Needs: No Transportation Needs (07/24/2023)   PRAPARE - Administrator, Civil Service (Medical): No    Lack of Transportation (Non-Medical): No  Physical Activity: Not on file  Stress: Not on file  Social Connections: Not on file  Intimate Partner Violence: Not At Risk (07/24/2023)   Humiliation, Afraid, Rape, and Kick questionnaire    Fear of Current or Ex-Partner: No    Emotionally Abused: No    Physically Abused: No    Sexually Abused: No    Family History  Problem Relation Age of Onset   Drug abuse Mother    Depression Mother    Bipolar disorder Mother    Alcohol abuse Father    Drug abuse Father    Bipolar disorder Father    Healthy Sister    Hypertension Maternal Grandmother    Diabetes Maternal Grandfather    Lung cancer Maternal Grandfather    Diabetes Maternal Aunt    Breast cancer Maternal Aunt    Hypertension Maternal  Aunt    Colon cancer Neg Hx     No Known Allergies  Outpatient Medications Prior to Visit  Medication Sig   ibuprofen  (ADVIL ) 600 MG tablet Take 1 tablet (600 mg total) by mouth every 6 (six) hours.   [DISCONTINUED] levothyroxine  (SYNTHROID ) 125 MCG tablet Take 2 tablets (250 mcg total) by mouth daily at 6 (six) AM.   hydrochlorothiazide  (HYDRODIURIL ) 25 MG tablet Take 1 tablet (25 mg total) by mouth daily. Begin after finishing the furosemide  on 08/05/2023 (Patient not taking: Reported on 02/05/2024)   [DISCONTINUED] aspirin  EC 81 MG tablet Take 2 tablets (162 mg total) by mouth daily. (Patient not taking: Reported on 09/05/2023)   [DISCONTINUED] ferrous sulfate  325 (65 FE) MG tablet Take 1 tablet (325 mg  total) by mouth every other day. (Patient not taking: Reported on 09/05/2023)   [DISCONTINUED] furosemide  (LASIX ) 40 MG tablet Take 1 tablet (40 mg total) by mouth daily for 5 days. (Patient not taking: Reported on 09/05/2023)   [DISCONTINUED] lisinopril  (ZESTRIL ) 20 MG tablet Take 1 tablet (20 mg total) by mouth daily. (Patient not taking: Reported on 02/05/2024)   [DISCONTINUED] NIFEdipine  (ADALAT  CC) 60 MG 24 hr tablet Take 1 tablet (60 mg total) by mouth 2 (two) times daily.   [DISCONTINUED] oxyCODONE  (OXY IR/ROXICODONE ) 5 MG immediate release tablet Take 1-2 tablets (5-10 mg total) by mouth every 4 (four) hours as needed for moderate pain (pain score 4-6). (Patient not taking: Reported on 09/05/2023)   [DISCONTINUED] potassium chloride  SA (KLOR-CON  M) 20 MEQ tablet Take 1 tablet (20 mEq total) by mouth daily. (Patient not taking: Reported on 09/05/2023)   [DISCONTINUED] Prenatal Vit-Fe Fumarate-FA (PRENATAL PO) Take by mouth. (Patient not taking: Reported on 09/05/2023)   No facility-administered medications prior to visit.    Review of Systems  Constitutional: Negative.   HENT: Negative.    Eyes: Negative.   Respiratory: Negative.  Negative for shortness of breath.   Cardiovascular: Negative.  Negative for chest pain.  Gastrointestinal: Negative.  Negative for abdominal pain, constipation and diarrhea.  Genitourinary: Negative.   Musculoskeletal:  Negative for joint pain and myalgias.  Skin: Negative.   Neurological: Negative.  Negative for dizziness and headaches.  Endo/Heme/Allergies: Negative.   Psychiatric/Behavioral:  Negative for depression. The patient is nervous/anxious.   All other systems reviewed and are negative.      Objective:   BP (!) 168/114   Pulse 74   Ht 5' 5 (1.651 m)   Wt 294 lb (133.4 kg)   SpO2 99%   BMI 48.92 kg/m   Vitals:   02/05/24 1313  BP: (!) 168/114  Pulse: 74  Height: 5' 5 (1.651 m)  Weight: 294 lb (133.4 kg)  SpO2: 99%  BMI (Calculated):  48.92    Physical Exam Vitals and nursing note reviewed.  Constitutional:      Appearance: Normal appearance. She is normal weight.  HENT:     Head: Normocephalic and atraumatic.     Nose: Nose normal.     Mouth/Throat:     Mouth: Mucous membranes are moist.  Eyes:     Extraocular Movements: Extraocular movements intact.     Conjunctiva/sclera: Conjunctivae normal.     Pupils: Pupils are equal, round, and reactive to light.  Cardiovascular:     Rate and Rhythm: Normal rate and regular rhythm.     Pulses: Normal pulses.     Heart sounds: Normal heart sounds.  Pulmonary:     Effort: Pulmonary effort is normal.  Breath sounds: Normal breath sounds.  Abdominal:     General: Abdomen is flat. Bowel sounds are normal.     Palpations: Abdomen is soft.  Musculoskeletal:        General: Normal range of motion.     Cervical back: Normal range of motion.  Skin:    General: Skin is warm and dry.  Neurological:     General: No focal deficit present.     Mental Status: She is alert and oriented to person, place, and time.  Psychiatric:        Mood and Affect: Mood normal.        Behavior: Behavior normal.        Thought Content: Thought content normal.        Judgment: Judgment normal.    Flowsheet Row Office Visit from 02/05/2024 in [facility] Office Visit from 04/25/2023 in [facility] Office Visit from 09/20/2022 in [facility]  Thoughts that you would be better off dead, or of hurting yourself in some way Not at all Not at all Not at all  PHQ-9 Total Score 5 4 0      02/05/2024    1:44 PM 04/25/2023   11:34 AM 09/20/2022    2:03 PM 04/11/2022    8:14 AM  GAD 7 : Generalized Anxiety Score  Nervous, Anxious, on Edge 1 1 1 1   Control/stop worrying 2 2 1  0  Worry too much - different things 2 3 1  0  Trouble relaxing 2 1 3  0  Restless 1 0 0 0  Easily annoyed or irritable 0 1  0  Afraid - awful might happen 3 0 0 0   Total GAD 7 Score 11 8  1   Anxiety Difficulty   Not difficult at all Not difficult at all    No results found for any visits on 02/05/24.  No results found for this or any previous visit (from the past 2160 hours).    Assessment & Plan:  Start lisinopril  5 mg daily Fasting lab work today Hydroxyzine  sent in  Referral sent to psychiatry  Problem List Items Addressed This Visit       Cardiovascular and Mediastinum   HTN (hypertension) - Primary   Relevant Medications   lisinopril  (ZESTRIL ) 5 MG tablet   Other Relevant Orders   CMP14+EGFR     Endocrine   Acquired hypothyroidism   Relevant Medications   levothyroxine  (SYNTHROID ) 125 MCG tablet   Other Relevant Orders   TSH     Other   Class 3 severe obesity due to excess calories with body mass index (BMI) of 45.0 to 49.9 in adult (HCC)   HLD (hyperlipidemia)   Relevant Medications   lisinopril  (ZESTRIL ) 5 MG tablet   Other Relevant Orders   Lipid Profile   Anxiety   Relevant Medications   hydrOXYzine  (ATARAX ) 10 MG tablet   Other Relevant Orders   Ambulatory referral to Psychiatry   CBC with Differential/Platelet   Vitamin D  (25 hydroxy)   Other Visit Diagnoses       Diabetes mellitus screening       Relevant Orders   Hemoglobin A1c       Return in about 4 weeks (around 03/04/2024).   Total time spent: 25 minutes. This time includes review of previous notes and results and patient face to face interaction during today's visit.    Jeoffrey Pollen, NP  02/05/2024   This document may have  been prepared by [employer] and as such may include unintentional dictation errors.

## 2024-02-06 ENCOUNTER — Other Ambulatory Visit: Payer: Self-pay | Admitting: Cardiology

## 2024-02-06 ENCOUNTER — Ambulatory Visit: Payer: Self-pay | Admitting: Cardiology

## 2024-02-06 LAB — CMP14+EGFR
ALT: 6 IU/L (ref 0–32)
AST: 15 IU/L (ref 0–40)
Albumin: 4.3 g/dL (ref 3.9–4.9)
Alkaline Phosphatase: 109 IU/L (ref 41–116)
BUN/Creatinine Ratio: 12 (ref 9–23)
BUN: 9 mg/dL (ref 6–20)
Bilirubin Total: 0.5 mg/dL (ref 0.0–1.2)
CO2: 23 mmol/L (ref 20–29)
Calcium: 8.8 mg/dL (ref 8.7–10.2)
Chloride: 98 mmol/L (ref 96–106)
Creatinine, Ser: 0.78 mg/dL (ref 0.57–1.00)
Globulin, Total: 3 g/dL (ref 1.5–4.5)
Glucose: 71 mg/dL (ref 70–99)
Potassium: 3.9 mmol/L (ref 3.5–5.2)
Sodium: 136 mmol/L (ref 134–144)
Total Protein: 7.3 g/dL (ref 6.0–8.5)
eGFR: 102 mL/min/1.73 (ref >59)

## 2024-02-06 LAB — CBC WITH DIFFERENTIAL/PLATELET
Basophils Absolute: 0 x10E3/uL (ref 0.0–0.2)
Basos: 1 %
EOS (ABSOLUTE): 0 x10E3/uL (ref 0.0–0.4)
Eos: 0 %
Hematocrit: 34.2 % (ref 34.0–46.6)
Hemoglobin: 10.7 g/dL — ABNORMAL LOW (ref 11.1–15.9)
Immature Grans (Abs): 0 x10E3/uL (ref 0.0–0.1)
Immature Granulocytes: 0 %
Lymphocytes Absolute: 2.4 x10E3/uL (ref 0.7–3.1)
Lymphs: 31 %
MCH: 24.8 pg — ABNORMAL LOW (ref 26.6–33.0)
MCHC: 31.3 g/dL — ABNORMAL LOW (ref 31.5–35.7)
MCV: 79 fL (ref 79–97)
Monocytes Absolute: 0.4 x10E3/uL (ref 0.1–0.9)
Monocytes: 5 %
Neutrophils Absolute: 4.9 x10E3/uL (ref 1.4–7.0)
Neutrophils: 63 %
Platelets: 521 x10E3/uL — ABNORMAL HIGH (ref 150–450)
RBC: 4.32 x10E6/uL (ref 3.77–5.28)
RDW: 15.2 % (ref 11.7–15.4)
WBC: 7.8 x10E3/uL (ref 3.4–10.8)

## 2024-02-06 LAB — LIPID PANEL
Chol/HDL Ratio: 9.6 ratio — ABNORMAL HIGH (ref 0.0–4.4)
Cholesterol, Total: 336 mg/dL — ABNORMAL HIGH (ref 100–199)
HDL: 35 mg/dL — ABNORMAL LOW (ref >39)
LDL Chol Calc (NIH): 229 mg/dL — ABNORMAL HIGH (ref 0–99)
Triglycerides: 339 mg/dL — ABNORMAL HIGH (ref 0–149)
VLDL Cholesterol Cal: 72 mg/dL — ABNORMAL HIGH (ref 5–40)

## 2024-02-06 LAB — TSH: TSH: 173 u[IU]/mL — ABNORMAL HIGH (ref 0.450–4.500)

## 2024-02-06 LAB — VITAMIN D 25 HYDROXY (VIT D DEFICIENCY, FRACTURES): Vit D, 25-Hydroxy: 6.9 ng/mL — ABNORMAL LOW (ref 30.0–100.0)

## 2024-02-06 LAB — HEMOGLOBIN A1C
Est. average glucose Bld gHb Est-mCnc: 103 mg/dL
Hgb A1c MFr Bld: 5.2 % (ref 4.8–5.6)

## 2024-02-06 MED ORDER — VITAMIN D (ERGOCALCIFEROL) 1.25 MG (50000 UNIT) PO CAPS: 50000.0000 [IU] | ORAL_CAPSULE | ORAL | 6 refills | Status: DC

## 2024-02-08 ENCOUNTER — Other Ambulatory Visit: Payer: Self-pay

## 2024-02-08 MED ORDER — VITAMIN D (ERGOCALCIFEROL) 1.25 MG (50000 UNIT) PO CAPS: 50000.0000 [IU] | ORAL_CAPSULE | ORAL | 6 refills | Status: AC

## 2024-03-04 ENCOUNTER — Ambulatory Visit: Admitting: Cardiology

## 2024-03-13 ENCOUNTER — Ambulatory Visit: Admitting: Cardiology

## 2024-05-20 ENCOUNTER — Encounter: Admitting: General Practice
